# Patient Record
Sex: Female | Born: 1945
Health system: Southern US, Community
[De-identification: ages and names within clinical notes are randomized; demographics above are authoritative.]

## PROBLEM LIST (undated history)

## (undated) DIAGNOSIS — J45909 Unspecified asthma, uncomplicated: Secondary | ICD-10-CM

## (undated) DIAGNOSIS — J449 Chronic obstructive pulmonary disease, unspecified: Secondary | ICD-10-CM

## (undated) DIAGNOSIS — G4730 Sleep apnea, unspecified: Secondary | ICD-10-CM

## (undated) DIAGNOSIS — R0902 Hypoxemia: Secondary | ICD-10-CM

## (undated) DIAGNOSIS — E785 Hyperlipidemia, unspecified: Secondary | ICD-10-CM

## (undated) DIAGNOSIS — I10 Essential (primary) hypertension: Secondary | ICD-10-CM

## (undated) DIAGNOSIS — E119 Type 2 diabetes mellitus without complications: Secondary | ICD-10-CM

## (undated) DIAGNOSIS — M1990 Unspecified osteoarthritis, unspecified site: Secondary | ICD-10-CM

## (undated) DIAGNOSIS — C801 Malignant (primary) neoplasm, unspecified: Secondary | ICD-10-CM

## (undated) HISTORY — DX: Essential (primary) hypertension: I10

## (undated) HISTORY — PX: ABDOMINAL HYSTERECTOMY: SHX81

## (undated) HISTORY — DX: Chronic obstructive pulmonary disease, unspecified: J44.9

## (undated) HISTORY — DX: Type 2 diabetes mellitus without complications: E11.9

## (undated) HISTORY — DX: Unspecified asthma, uncomplicated: J45.909

## (undated) HISTORY — DX: Hypoxemia: R09.02

## (undated) HISTORY — DX: Hyperlipidemia, unspecified: E78.5

## (undated) HISTORY — DX: Malignant (primary) neoplasm, unspecified: C80.1

## (undated) HISTORY — DX: Unspecified osteoarthritis, unspecified site: M19.90

## (undated) HISTORY — DX: Sleep apnea, unspecified: G47.30

## (undated) HISTORY — PX: COLONOSCOPY: SHX174

## (undated) HISTORY — PX: TUBAL LIGATION: SHX77

## (undated) HISTORY — PX: EYE SURGERY: SHX253

---

## 1997-10-14 ENCOUNTER — Ambulatory Visit (HOSPITAL_COMMUNITY): Admission: RE | Admit: 1997-10-14 | Discharge: 1997-10-14 | Payer: Self-pay | Admitting: *Deleted

## 1998-06-03 ENCOUNTER — Other Ambulatory Visit: Admission: RE | Admit: 1998-06-03 | Discharge: 1998-06-03 | Payer: Self-pay | Admitting: *Deleted

## 1998-06-29 ENCOUNTER — Other Ambulatory Visit: Admission: RE | Admit: 1998-06-29 | Discharge: 1998-06-29 | Payer: Self-pay | Admitting: *Deleted

## 1998-08-18 ENCOUNTER — Ambulatory Visit (HOSPITAL_COMMUNITY): Admission: RE | Admit: 1998-08-18 | Discharge: 1998-08-18 | Payer: Self-pay | Admitting: Gastroenterology

## 1998-08-18 ENCOUNTER — Encounter: Payer: Self-pay | Admitting: Gastroenterology

## 1998-11-14 ENCOUNTER — Inpatient Hospital Stay (HOSPITAL_COMMUNITY): Admission: AD | Admit: 1998-11-14 | Discharge: 1998-11-18 | Payer: Self-pay | Admitting: *Deleted

## 1998-11-16 ENCOUNTER — Encounter: Payer: Self-pay | Admitting: Pulmonary Disease

## 1998-11-18 ENCOUNTER — Encounter: Payer: Self-pay | Admitting: Pulmonary Disease

## 1999-02-23 ENCOUNTER — Encounter: Admission: RE | Admit: 1999-02-23 | Discharge: 1999-03-16 | Payer: Self-pay | Admitting: *Deleted

## 2005-05-23 ENCOUNTER — Encounter: Payer: Self-pay | Admitting: *Deleted

## 2007-12-02 ENCOUNTER — Ambulatory Visit: Payer: Self-pay | Admitting: Internal Medicine

## 2007-12-02 DIAGNOSIS — D869 Sarcoidosis, unspecified: Secondary | ICD-10-CM

## 2007-12-02 DIAGNOSIS — Z8541 Personal history of malignant neoplasm of cervix uteri: Secondary | ICD-10-CM

## 2007-12-02 DIAGNOSIS — I10 Essential (primary) hypertension: Secondary | ICD-10-CM

## 2007-12-02 DIAGNOSIS — G4730 Sleep apnea, unspecified: Secondary | ICD-10-CM | POA: Insufficient documentation

## 2007-12-02 DIAGNOSIS — J45909 Unspecified asthma, uncomplicated: Secondary | ICD-10-CM | POA: Insufficient documentation

## 2009-03-06 ENCOUNTER — Encounter: Admission: RE | Admit: 2009-03-06 | Discharge: 2009-03-06 | Payer: Self-pay | Admitting: Emergency Medicine

## 2011-04-14 ENCOUNTER — Ambulatory Visit (INDEPENDENT_AMBULATORY_CARE_PROVIDER_SITE_OTHER): Payer: Medicare Other | Admitting: Family Medicine

## 2011-04-14 VITALS — BP 178/73 | HR 71 | Temp 98.3°F | Ht 67.0 in | Wt 353.0 lb

## 2011-04-14 DIAGNOSIS — J45909 Unspecified asthma, uncomplicated: Secondary | ICD-10-CM

## 2011-04-14 DIAGNOSIS — I10 Essential (primary) hypertension: Secondary | ICD-10-CM

## 2011-04-14 MED ORDER — HYDROCHLOROTHIAZIDE 25 MG PO TABS: 25.0000 mg | ORAL_TABLET | Freq: Every day | ORAL | Status: DC

## 2011-04-14 MED ORDER — MOMETASONE FURO-FORMOTEROL FUM 200-5 MCG/ACT IN AERO: 2.0000 | INHALATION_SPRAY | Freq: Two times a day (BID) | RESPIRATORY_TRACT | Status: DC

## 2011-04-14 MED ORDER — AZITHROMYCIN 250 MG PO TABS: ORAL_TABLET | ORAL | Status: AC

## 2011-04-14 NOTE — Patient Instructions (Signed)
Asthma, Adult Asthma is caused by narrowing of the air passages in the lungs. It may be triggered by pollen, dust, animal dander, molds, some foods, respiratory infections, exposure to smoke, exercise, emotional stress or other allergens (things that cause allergic reactions or allergies). Repeat attacks are common. HOME CARE INSTRUCTIONS   Use prescription medications as ordered by your caregiver.   Avoid pollen, dust, animal dander, molds, smoke and other things that cause attacks at home and at work.   You may have fewer attacks if you decrease dust in your home. Electrostatic air cleaners may help.   It may help to replace your pillows or mattress with materials less likely to cause allergies.   Talk to your caregiver about an action plan for managing asthma attacks at home, including, the use of a peak flow meter which measures the severity of your asthma attack. An action plan can help minimize or stop the attack without having to seek medical care.   If you are not on a fluid restriction, drink 8 to 10 glasses of water each day.   Always have a plan prepared for seeking medical attention, including, calling your physician, accessing local emergency care, and calling 911 (in the [place] for a severe attack.   Discuss possible exercise routines with your caregiver.   If animal dander is the cause of asthma, you may need to get rid of pets.  SEEK MEDICAL CARE IF:   You have wheezing and shortness of breath even if taking medicine to prevent attacks.   You have muscle aches, chest pain or thickening of sputum.   Your sputum changes from clear or white to yellow, green, gray, or bloody.   You have any problems that may be related to the medicine you are taking (such as a rash, itching, swelling or trouble breathing).  SEEK IMMEDIATE MEDICAL CARE IF:   Your usual medicines do not stop your wheezing or there is increased coughing and/or shortness of breath.   You have increased  difficulty breathing.   You have a fever.  MAKE SURE YOU:   Understand these instructions.   Will watch your condition.   Will get help right away if you are not doing well or get worse.  Document Released: 01/08/2005 Document Revised: 12/28/2010 Document Reviewed: 08/27/2007 [facility] Patient Information 2012 [place], [place].Asthma Prevention Cigarette smoke, house dust, molds, pollens, animal dander, certain insects, exercise, and even cold air are all triggers that can cause an asthma attack. Often, no specific triggers are identified.  Take the following measures around your house to reduce attacks:  Avoid cigarette and other smoke. No smoking should be allowed in a home where someone with asthma lives. If smoking is allowed indoors, it should be done in a room with a closed door, and a window should be opened to clear the air. If possible, do not use a wood-burning stove, kerosene heater, or fireplace. Minimize exposure to all sources of smoke, including incense, candles, fires, and fireworks.   Decrease pollen exposure. Keep your windows shut and use central air during the pollen allergy season. Stay indoors with windows closed from late morning to afternoon, if you can. Avoid mowing the lawn if you have grass pollen allergy. Change your clothes and shower after being outside during this time of year.   Remove molds from bathrooms and wet areas. Do this by cleaning the floors with a fungicide or diluted bleach. Avoid using humidifiers, vaporizers, or swamp coolers. These can spread molds through the  air. Fix leaky faucets, pipes, or other sources of water that have mold around them.   Decrease house dust exposure. Do this by using bare floors, vacuuming frequently, and changing furnace and air cooler filters frequently. Avoid using feather, wool, or foam bedding. Use polyester pillows and plastic covers over your mattress. Wash bedding weekly in hot water (hotter than 130 F).   Try to  get someone else to vacuum for you once or twice a week, if you can. Stay out of rooms while they are being vacuumed and for a short while afterward. If you vacuum, use a dust mask (from a hardware store), a double-layered or microfilter vacuum cleaner bag, or a vacuum cleaner with a HEPA filter.   Avoid perfumes, talcum powder, hair spray, paints and other strong odors and fumes.   Keep warm-blooded pets (cats, dogs, rodents, birds) outside the home if they are triggers for asthma. If you can't keep the pet outdoors, keep the pet out of your bedroom and other sleeping areas at all times, and keep the door closed. Remove carpets and furniture covered with cloth from your home. If that is not possible, keep the pet away from fabric-covered furniture and carpets.   Eliminate cockroaches. Keep food and garbage in closed containers. Never leave food out. Use poison baits, traps, powders, gels, or paste (for example, boric acid). If a spray is used to kill cockroaches, stay out of the room until the odor goes away.   Decrease indoor humidity to less than 60%. Use an indoor air cleaning device.   Avoid sulfites in foods and beverages. Do not drink beer or wine or eat dried fruit, processed potatoes, or shrimp if they cause asthma symptoms.   Avoid cold air. Cover your nose and mouth with a scarf on cold or windy days.   Avoid aspirin. This is the most common drug causing serious asthma attacks.   If exercise triggers your asthma, ask your caregiver how you should prepare before exercising. (For example, ask if you could use your inhaler 10 minutes before exercising.)   Avoid close contact with people who have a cold or the flu since your asthma symptoms may get worse if you catch the infection from them. Wash your hands thoroughly after touching items that may have been handled by others with a respiratory infection.   Get a flu shot every year to protect against the flu virus, which often makes asthma  worse for days to weeks. Also get a pneumonia shot once every five to 10 years.  Call your caregiver if you want further information about measures you can take to help prevent asthma attacks. Document Released: 01/08/2005 Document Revised: 12/28/2010 Document Reviewed: 11/16/2008 [facility] Patient Information 2012 [place], [place].

## 2011-04-14 NOTE — Progress Notes (Signed)
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  Inhaler is just not working and she ran out.  Also needs bp med after 5-6 years of hypertension.  No h/o heart disease.  No chest pain.   O:  Alert, mildly dyspneic with walking in office, friendly and not in any acute distress Chest:  Wheezes bilaterally Heart: reg, no murmur Ext: no signif edema  A: asthmatic bronchitis, hypertension  P:  dulera 200bid z pak.  Refill HCTZ 25 qd

## 2011-10-30 ENCOUNTER — Ambulatory Visit (INDEPENDENT_AMBULATORY_CARE_PROVIDER_SITE_OTHER): Payer: Medicare Other | Admitting: Family Medicine

## 2011-10-30 VITALS — BP 152/92 | HR 98 | Temp 98.7°F | Resp 28 | Ht 67.5 in | Wt 368.0 lb

## 2011-10-30 DIAGNOSIS — Z79899 Other long term (current) drug therapy: Secondary | ICD-10-CM

## 2011-10-30 DIAGNOSIS — M1990 Unspecified osteoarthritis, unspecified site: Secondary | ICD-10-CM

## 2011-10-30 DIAGNOSIS — I10 Essential (primary) hypertension: Secondary | ICD-10-CM

## 2011-10-30 DIAGNOSIS — J45901 Unspecified asthma with (acute) exacerbation: Secondary | ICD-10-CM

## 2011-10-30 DIAGNOSIS — M129 Arthropathy, unspecified: Secondary | ICD-10-CM

## 2011-10-30 LAB — POCT CBC
Granulocyte percent: 53.6 %G (ref 37–80)
HCT, POC: 45.4 % (ref 37.7–47.9)
Lymph, poc: 3.9 — AB (ref 0.6–3.4)
MCH, POC: 26.8 pg — AB (ref 27–31.2)
MCHC: 29.7 g/dL — AB (ref 31.8–35.4)
MCV: 90.1 fL (ref 80–97)
MID (cbc): 0.9 (ref 0–0.9)
POC LYMPH PERCENT: 38.1 %L (ref 10–50)
Platelet Count, POC: 298 10*3/uL (ref 142–424)
RDW, POC: 14.9 %
WBC: 10.3 10*3/uL — AB (ref 4.6–10.2)

## 2011-10-30 MED ORDER — MELOXICAM 7.5 MG PO TABS: 7.5000 mg | ORAL_TABLET | Freq: Every day | ORAL | Status: DC | PRN

## 2011-10-30 MED ORDER — ALBUTEROL SULFATE (2.5 MG/3ML) 0.083% IN NEBU: 2.5000 mg | INHALATION_SOLUTION | RESPIRATORY_TRACT | Status: DC | PRN

## 2011-10-30 MED ORDER — PREDNISONE 20 MG PO TABS: 40.0000 mg | ORAL_TABLET | Freq: Every day | ORAL | Status: DC

## 2011-10-30 MED ORDER — HYDROCODONE-HOMATROPINE 5-1.5 MG/5ML PO SYRP: 5.0000 mL | ORAL_SOLUTION | Freq: Three times a day (TID) | ORAL | Status: DC | PRN

## 2011-10-30 MED ORDER — ALBUTEROL SULFATE (2.5 MG/3ML) 0.083% IN NEBU
2.5000 mg | INHALATION_SOLUTION | Freq: Once | RESPIRATORY_TRACT | Status: AC
  Administered 2011-10-30: 2.5 mg via RESPIRATORY_TRACT

## 2011-10-30 NOTE — Progress Notes (Signed)
  Subjective:    Patient ID: Christine Parrish, female    DOB: 1945/02/06, 66 y.o.   MRN: [id number]  HPI  Had a cold and allergies which has now resolved but still coughing a lot - really bothering her.  Whenever she gets ill she knows she can get through the cold but can't rid of the cough as her asthma gets exac.  Did have sarcoid but has been latent and no prob for years.   F/c and cold sxs all resolved. Cough productive. No SHoB or wheezing.  Does have a nebulizer machine but needs new tubing to use it.    Review of Systems  Constitutional: Positive for fatigue. Negative for fever, chills, diaphoresis and unexpected weight change.  HENT: Negative for ear pain, congestion, sore throat, rhinorrhea and sinus pressure.   Eyes: Negative for discharge and itching.  Respiratory: Positive for cough. Negative for chest tightness and shortness of breath.   Cardiovascular: Negative for chest pain.  Gastrointestinal: Negative for nausea and vomiting.  Genitourinary: Positive for frequency. Negative for decreased urine volume and difficulty urinating.  Musculoskeletal: Positive for arthralgias. Negative for gait problem.  Skin: Negative for color change and rash.  Hematological: Positive for adenopathy.      No past medical history on file. BP 152/92  Pulse 98  Temp 98.7 F (37.1 C) (Oral)  Resp 28  Ht 5' 7.5" (1.715 m)  Wt 368 lb (166.924 kg)  BMI 56.79 kg/m2  SpO2 89%  Objective:   Physical Exam  Constitutional: She is oriented to person, place, and time. She appears well-developed and well-nourished. No distress.  HENT:  Head: Normocephalic and atraumatic.  Right Ear: Tympanic membrane, external ear and ear canal normal.  Left Ear: Tympanic membrane, external ear and ear canal normal.  Nose: Mucosal edema present. No rhinorrhea.  Mouth/Throat: Oropharynx is clear and moist. No oropharyngeal exudate.  Eyes: Conjunctivae normal are normal. Right eye exhibits no discharge. Left eye  exhibits no discharge. No scleral icterus.  Neck: Normal range of motion. Neck supple. Thyromegaly present.  Cardiovascular: Normal rate, regular rhythm and normal heart sounds.   Pulmonary/Chest: Effort normal and breath sounds normal. No accessory muscle usage. No respiratory distress. She has no decreased breath sounds. She has no wheezes. She has no rales.       Mild inspiratory wheeze and prolonged inspiration with repeated ventilation.  Lymphadenopathy:    She has no cervical adenopathy.  Neurological: She is alert and oriented to person, place, and time.  Skin: Skin is warm and dry. She is not diaphoretic.  Psychiatric: She has a normal mood and affect. Her behavior is normal.          Assessment & Plan:  1. Asthma exac - prednisone burst, alb nebs, prn hycodan. Gave neg in office, brought O2 sats up to 96%. 2. Arthritis - refilled mobic - check renal fxn 3. HTN - cont hctz - check electrolytes, likely elev due to acute illness. Recheck in 1 mo.

## 2011-10-31 ENCOUNTER — Telehealth: Payer: Self-pay

## 2011-10-31 LAB — BASIC METABOLIC PANEL
BUN: 19 mg/dL (ref 6–23)
Calcium: 9.7 mg/dL (ref 8.4–10.5)
Creat: 1.15 mg/dL — ABNORMAL HIGH (ref 0.50–1.10)
Glucose, Bld: 110 mg/dL — ABNORMAL HIGH (ref 70–99)
Potassium: 3.3 mEq/L — ABNORMAL LOW (ref 3.5–5.3)

## 2011-10-31 NOTE — Progress Notes (Signed)
Reviewed and agree.

## 2011-10-31 NOTE — Telephone Encounter (Signed)
The patient was seen by Dr. Clelia Croft on 10/30/11.  The patient is requesting cough syrup for her cough.  The patient states that her constant coughing is causing her to have bladder leakage and she really needs cough medicine at this time.  Please call the patient at [phone].

## 2011-10-31 NOTE — Telephone Encounter (Signed)
Looks like she was rx'ed Hycodan. Is she taking this? She may also need to be using an inhaler, does she have an albuterol inhaler at home?

## 2011-11-01 NOTE — Telephone Encounter (Signed)
Pt reports that her pharm did not get the Rx for Hycodan - she keeps calling and even this morning was told they don't have Rx. I advised pt that I will call [facility] for her and give it to them over the phone if they do not have it. Pt thanked [place].

## 2011-11-01 NOTE — Telephone Encounter (Signed)
Cough syrup faxed to [facility], I have called patient, to advise but VM not set up.

## 2012-01-12 ENCOUNTER — Ambulatory Visit (INDEPENDENT_AMBULATORY_CARE_PROVIDER_SITE_OTHER): Payer: Medicare Other | Admitting: Emergency Medicine

## 2012-01-12 ENCOUNTER — Ambulatory Visit: Payer: Medicare Other

## 2012-01-12 VITALS — BP 158/98 | HR 94 | Temp 98.2°F | Resp 18 | Ht 66.5 in | Wt 373.0 lb

## 2012-01-12 DIAGNOSIS — J45909 Unspecified asthma, uncomplicated: Secondary | ICD-10-CM

## 2012-01-12 DIAGNOSIS — M25519 Pain in unspecified shoulder: Secondary | ICD-10-CM

## 2012-01-12 DIAGNOSIS — I10 Essential (primary) hypertension: Secondary | ICD-10-CM

## 2012-01-12 DIAGNOSIS — R0602 Shortness of breath: Secondary | ICD-10-CM

## 2012-01-12 DIAGNOSIS — M25512 Pain in left shoulder: Secondary | ICD-10-CM

## 2012-01-12 DIAGNOSIS — M542 Cervicalgia: Secondary | ICD-10-CM

## 2012-01-12 MED ORDER — GABAPENTIN 300 MG PO CAPS: ORAL_CAPSULE | ORAL | Status: DC

## 2012-01-12 MED ORDER — HYDROCODONE-ACETAMINOPHEN 5-325 MG PO TABS: 1.0000 | ORAL_TABLET | Freq: Three times a day (TID) | ORAL | Status: DC | PRN

## 2012-01-12 MED ORDER — ALBUTEROL SULFATE (2.5 MG/3ML) 0.083% IN NEBU: 2.5000 mg | INHALATION_SOLUTION | Freq: Once | RESPIRATORY_TRACT | Status: DC

## 2012-01-12 NOTE — Patient Instructions (Addendum)
Cervical Radiculopathy  Cervical radiculopathy happens when a nerve in the neck is pinched or bruised by a slipped (herniated) disk or by arthritic changes in the bones of the cervical spine. This can occur due to an injury or as part of the normal aging process. Pressure on the cervical nerves can cause pain or numbness that runs from your neck all the way down into your arm and fingers.  CAUSES   There are many possible causes, including:   Injury.   Muscle tightness in the neck from overuse.   Swollen, painful joints (arthritis).   Breakdown or degeneration in the bones and joints of the spine (spondylosis) due to aging.   Bone spurs that may develop near the cervical nerves.  SYMPTOMS   Symptoms include pain, weakness, or numbness in the affected arm and hand. Pain can be severe or irritating. Symptoms may be worse when extending or turning the neck.  DIAGNOSIS   Your caregiver will ask about your symptoms and do a physical exam. He or she may test your strength and reflexes. X-rays, CT scans, and MRI scans may be needed in cases of injury or if the symptoms do not go away after a period of time. Electromyography (EMG) or nerve conduction testing may be done to study how your nerves and muscles are working.  TREATMENT   Your caregiver may recommend certain exercises to help relieve your symptoms. Cervical radiculopathy can, and often does, get better with time and treatment. If your problems continue, treatment options may include:   Wearing a soft collar for short periods of time.   Physical therapy to strengthen the neck muscles.   Medicines, such as nonsteroidal anti-inflammatory drugs (NSAIDs), oral corticosteroids, or spinal injections.   Surgery. Different types of surgery may be done depending on the cause of your problems.  HOME CARE INSTRUCTIONS    Put ice on the affected area.   Put ice in a plastic bag.   Place a towel between your skin and the bag.   Leave the ice on for 15 to 20  minutes, 3 to 4 times a day or as directed by your caregiver.   If ice does not help, you can try using heat. Take a warm shower or bath, or use a hot water bottle as directed by your caregiver.   You may try a gentle neck and shoulder massage.   Use a flat pillow when you sleep.   Only take over-the-counter or prescription medicines for pain, discomfort, or fever as directed by your caregiver.   If physical therapy was prescribed, follow your caregiver's directions.   If a soft collar was prescribed, use it as directed.  SEEK IMMEDIATE MEDICAL CARE IF:    Your pain gets much worse and cannot be controlled with medicines.   You have weakness or numbness in your hand, arm, face, or leg.   You have a high fever or a stiff, rigid neck.   You lose bowel or bladder control (incontinence).   You have trouble with walking, balance, or speaking.  MAKE SURE YOU:    Understand these instructions.   Will watch your condition.   Will get help right away if you are not doing well or get worse.  Document Released: 10/03/2000 Document Revised: 04/02/2011 Document Reviewed: 08/22/2010  [employer] Patient Information 2013 [employer].

## 2012-01-12 NOTE — Progress Notes (Signed)
  Subjective:    Patient ID: Christine Parrish, female    DOB: 09-08-1945, 66 y.o.   MRN: [id number]  HPI   Presents today with left shoulder pain. Some neck pain. She took Nyquil to get some sleep, states this knocks her out. She has some numbness with her fingers of left hand. Has seen orthopedics for her left shoulder and had injection, unsure when this was done, patient states recently.    Review of Systems     Objective:   Physical Exam Obese female appears short of breath during exam. States she did not take her breathing treatment today. Significant tenderness over the left side of her neck. There is decreased range of motion of her neck. There is no focal weakness of the upper extremities. Chest exam reveals bilateral expiratory wheezes. Cardiac exam is unremarkable. Repeat blood pressure was 158/98  MRI done in 2011 showed multi-level disease worse at C6-C7.   [facility] reading (PRIMARY) by  Dr. Cleta Alberts there are degenerative changes of the shoulder. There are also some radiolucent areas in the scapula and glenoid area pleasecomment  Assessment & Plan:  Ahead and get a breathing treatment with albuterol. I would not want her on prednisone because of her elevated blood pressure and poor control here. We'll need to add a medication to help with her blood pressure control given on pain medications and see if and get her comfortable. We'll go ahead and try some hydrocodone for pain I started her on Neurontin. The radiologist is giving an over read on the x-rays that were done. She is agreeable to come in in January and have a full physical at that time.

## 2012-04-08 ENCOUNTER — Other Ambulatory Visit: Payer: Self-pay | Admitting: Family Medicine

## 2012-04-08 NOTE — Telephone Encounter (Signed)
Pt needs OV and labs, was supposed to recheck Nov 2013 after abnormal BMP

## 2012-05-24 ENCOUNTER — Ambulatory Visit (INDEPENDENT_AMBULATORY_CARE_PROVIDER_SITE_OTHER): Payer: Medicare Other | Admitting: Family Medicine

## 2012-05-24 VITALS — BP 150/96 | HR 90 | Temp 99.0°F | Resp 16 | Ht 66.0 in | Wt 365.8 lb

## 2012-05-24 DIAGNOSIS — M47812 Spondylosis without myelopathy or radiculopathy, cervical region: Secondary | ICD-10-CM

## 2012-05-24 DIAGNOSIS — I10 Essential (primary) hypertension: Secondary | ICD-10-CM

## 2012-05-24 DIAGNOSIS — M5412 Radiculopathy, cervical region: Secondary | ICD-10-CM | POA: Insufficient documentation

## 2012-05-24 LAB — COMPREHENSIVE METABOLIC PANEL
AST: 13 U/L (ref 0–37)
Alkaline Phosphatase: 67 U/L (ref 39–117)
BUN: 14 mg/dL (ref 6–23)
Creat: 1.2 mg/dL — ABNORMAL HIGH (ref 0.50–1.10)

## 2012-05-24 LAB — LIPID PANEL
Cholesterol: 182 mg/dL (ref 0–200)
HDL: 51 mg/dL (ref >39)
Total CHOL/HDL Ratio: 3.6 Ratio
Triglycerides: 82 mg/dL (ref <150)

## 2012-05-24 MED ORDER — HYDROCODONE-ACETAMINOPHEN 5-325 MG PO TABS: 1.0000 | ORAL_TABLET | Freq: Three times a day (TID) | ORAL | Status: DC | PRN

## 2012-05-24 MED ORDER — HYDROCHLOROTHIAZIDE 25 MG PO TABS: 25.0000 mg | ORAL_TABLET | Freq: Every day | ORAL | Status: DC

## 2012-05-24 MED ORDER — MELOXICAM 7.5 MG PO TABS: 7.5000 mg | ORAL_TABLET | Freq: Every day | ORAL | Status: DC | PRN

## 2012-05-24 MED ORDER — GABAPENTIN 300 MG PO CAPS: ORAL_CAPSULE | ORAL | Status: DC

## 2012-05-24 NOTE — Patient Instructions (Addendum)
Usual medications as directed.   Be very careful with the hydrocodone pain pills.  With a blood pressure being high I would like you to return in 3 or 4 months for a recheck.  I encourage you to get out more and try to get a little exercise.  Eat less to help you lose weight.  Call the breast Center to arrange for a mammogram

## 2012-05-24 NOTE — Progress Notes (Signed)
Subjective: 67 year old lady who needs medicine refills. She takes medicines for her neck. She has radiculopathy down into her hands. She takes gabapentin, Mobic, and pain medications for that. It helps some. She still has gotten weak and in her grips. 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 Her blood pressure apparently is usually controlled, but she has been out of her medications. She has no other major acute complaints. She lives a in her apartment but a daughter and a son stay there some also. Her some retrocardiac, so she does not have running a transfixion. She regularly does stays at home and does not go out.  Objective: Pleasant alert oriented obese Afro-American female in no major distress. Neck has full range of motion but does have some crepitance. Her grip is weak in both arms. Blood pressure is still little high even on repeat, but she did not have her medication. Her chest is clear. Heart regular without murmurs.  Assessment: Cervical pain with radiculopathy Hypertension History of sarcoidosis History of asthma  Plan: Refill her medications. Give her one month or so worth of hydrocodone pain medication, just use on a when necessary basis.

## 2012-05-24 NOTE — Addendum Note (Signed)
Addended by: HOPPER, DAVID H on: 05/24/2012 12:54 PM   Modules accepted: Orders

## 2012-05-26 ENCOUNTER — Encounter: Payer: Self-pay | Admitting: Family Medicine

## 2012-05-30 ENCOUNTER — Other Ambulatory Visit: Payer: Self-pay

## 2012-05-30 DIAGNOSIS — Z1231 Encounter for screening mammogram for malignant neoplasm of breast: Secondary | ICD-10-CM

## 2012-07-08 ENCOUNTER — Ambulatory Visit
Admission: RE | Admit: 2012-07-08 | Discharge: 2012-07-08 | Disposition: A | Discharge status: Home / Self Care | Payer: Medicare Other | Source: Ambulatory Visit

## 2012-07-08 DIAGNOSIS — Z1231 Encounter for screening mammogram for malignant neoplasm of breast: Secondary | ICD-10-CM

## 2013-06-10 ENCOUNTER — Ambulatory Visit (HOSPITAL_COMMUNITY): Admission: RE | Admit: 2013-06-10 | Payer: Medicare Other | Source: Ambulatory Visit

## 2013-06-10 ENCOUNTER — Ambulatory Visit (HOSPITAL_COMMUNITY): Payer: Medicare Other

## 2013-06-10 ENCOUNTER — Ambulatory Visit: Payer: Medicare Other

## 2013-06-10 ENCOUNTER — Ambulatory Visit (INDEPENDENT_AMBULATORY_CARE_PROVIDER_SITE_OTHER): Payer: Medicare Other | Admitting: Family Medicine

## 2013-06-10 VITALS — BP 162/100 | HR 112 | Temp 98.4°F | Resp 17 | Ht 65.0 in | Wt 364.0 lb

## 2013-06-10 DIAGNOSIS — IMO0002 Reserved for concepts with insufficient information to code with codable children: Secondary | ICD-10-CM

## 2013-06-10 DIAGNOSIS — R05 Cough: Secondary | ICD-10-CM

## 2013-06-10 DIAGNOSIS — R011 Cardiac murmur, unspecified: Secondary | ICD-10-CM

## 2013-06-10 DIAGNOSIS — I10 Essential (primary) hypertension: Secondary | ICD-10-CM

## 2013-06-10 DIAGNOSIS — R059 Cough, unspecified: Secondary | ICD-10-CM

## 2013-06-10 DIAGNOSIS — R0602 Shortness of breath: Secondary | ICD-10-CM

## 2013-06-10 DIAGNOSIS — E1165 Type 2 diabetes mellitus with hyperglycemia: Secondary | ICD-10-CM

## 2013-06-10 DIAGNOSIS — E119 Type 2 diabetes mellitus without complications: Secondary | ICD-10-CM | POA: Insufficient documentation

## 2013-06-10 DIAGNOSIS — E118 Type 2 diabetes mellitus with unspecified complications: Secondary | ICD-10-CM

## 2013-06-10 DIAGNOSIS — R000 Tachycardia, unspecified: Secondary | ICD-10-CM

## 2013-06-10 LAB — POCT CBC
Granulocyte percent: 67.7 %G (ref 37–80)
HEMATOCRIT: 48.6 % — AB (ref 37.7–47.9)
HEMOGLOBIN: 15.1 g/dL (ref 12.2–16.2)
LYMPH, POC: 2.9 (ref 0.6–3.4)
MCH: 28 pg (ref 27–31.2)
MCHC: 31.1 g/dL — AB (ref 31.8–35.4)
MCV: 90.1 fL (ref 80–97)
MID (cbc): 0.7 (ref 0–0.9)
MPV: 11.6 fL (ref 0–99.8)
POC Granulocyte: 7.5 — AB (ref 2–6.9)
POC LYMPH %: 26.2 % (ref 10–50)
POC MID %: 6.1 %M (ref 0–12)
Platelet Count, POC: 314 10*3/uL (ref 142–424)
RBC: 5.39 M/uL (ref 4.04–5.48)
RDW, POC: 14.9 %
WBC: 11.1 10*3/uL — AB (ref 4.6–10.2)

## 2013-06-10 LAB — GLUCOSE, POCT (MANUAL RESULT ENTRY): POC GLUCOSE: 247 mg/dL — AB (ref 70–99)

## 2013-06-10 LAB — D-DIMER, QUANTITATIVE: D-Dimer, Quant: 0.59 ug/mL-FEU — ABNORMAL HIGH (ref 0.00–0.48)

## 2013-06-10 LAB — POCT GLYCOSYLATED HEMOGLOBIN (HGB A1C): Hemoglobin A1C: 8.4

## 2013-06-10 MED ORDER — GUAIFENESIN-CODEINE 100-10 MG/5ML PO SYRP: 5.0000 mL | ORAL_SOLUTION | Freq: Three times a day (TID) | ORAL | Status: DC | PRN

## 2013-06-10 MED ORDER — HYDROCHLOROTHIAZIDE 25 MG PO TABS: 25.0000 mg | ORAL_TABLET | Freq: Every day | ORAL | Status: DC

## 2013-06-10 MED ORDER — DOXYCYCLINE HYCLATE 100 MG PO CAPS: 100.0000 mg | ORAL_CAPSULE | Freq: Two times a day (BID) | ORAL | Status: DC

## 2013-06-10 MED ORDER — LISINOPRIL 10 MG PO TABS: 10.0000 mg | ORAL_TABLET | Freq: Every day | ORAL | Status: DC

## 2013-06-10 MED ORDER — METFORMIN HCL 500 MG PO TABS: 500.0000 mg | ORAL_TABLET | Freq: Two times a day (BID) | ORAL | Status: DC

## 2013-06-10 NOTE — Progress Notes (Addendum)
[facility] [address] [place] [address] [phone]  Date:  06/10/2013   Name:  Christine Parrish   DOB:  03-20-1945   MRN:  [id number]  PCP:  Reginia Forts, MD    Chief Complaint: Cough and Medication Refill   History of Present Illness:  Christine Parrish is a 67 y.o. very pleasant female patient who presents with the following:  History of HTN, sleep apnea, sarcoidosis.  Last seen here about a year ago.   She came down with "a cold" on Friday- today is Wednesday.  She is now feeling better, but she continues to cough some.  She used OTC medication for her other sx.  She did have a fever and chills.  She did cough up some "fluid" with "green and yellow specks."    She also needs a RF of her BP medication. She normally takes her HCTZ in the morning; she did not take it over the last couple of days.  She has a blood pressure machine but has not used it lately.  She needs to change the battery in the machine so it will work.   She notes that she gets very SOB with walking more than a few feet; this has been the case for a couple of years.  She quit smoking about 10 years ago. She smoked since "I was a little bitty kid, about 11 years old."   She notes osscaional chest pain; "I think it be indigestion but I don't know."  It does not seem to be associated with activity, but reports the last time she really exercised was about 2 years ago when she had to visit her nephew in the hospital. "I nearly passed out."   She has never seen cardiology, never had a stress test. She does not have any active CP now  Patient Active Problem List   Diagnosis Date Noted  . Cervical neuropathic pain 05/24/2012  . SARCOIDOSIS 12/02/2007  . HYPERTENSION 12/02/2007  . ASTHMA 12/02/2007  . SLEEP APNEA 12/02/2007  . CERVICAL CANCER, HX OF 12/02/2007    Past Medical History  Diagnosis Date  . Arthritis   . Asthma   . Hypertension   . Cancer     Past Surgical History  Procedure  Laterality Date  . Eye surgery    . Tubal ligation    . Abdominal hysterectomy      History  Substance Use Topics  . Smoking status: Former Smoker    Types: Cigarettes    Quit date: 06/23/2004  . Smokeless tobacco: Not on file  . Alcohol Use: Yes    History reviewed. No pertinent family history.  No Known Allergies  Medication list has been reviewed and updated.  Current Outpatient Prescriptions on File Prior to Visit  Medication Sig Dispense Refill  . gabapentin (NEURONTIN) 300 MG capsule Take 1 tablet at night for the first week and if tolerated increase to 1 tablet in the morning one tablet at bedtime .  90 capsule  3  . hydrochlorothiazide (HYDRODIURIL) 25 MG tablet Take 1 tablet (25 mg total) by mouth daily. NEED VISIT/LABS!!  90 tablet  3  . HYDROcodone-acetaminophen (NORCO) 5-325 MG per tablet Take 1 tablet by mouth every 8 (eight) hours as needed for pain.  30 tablet  0  . HYDROcodone-homatropine (HYCODAN) 5-1.5 MG/5ML syrup Take 5 mLs by mouth every 8 (eight) hours as needed for cough.  120 mL  0  . meloxicam (MOBIC) 7.5  MG tablet Take 1 tablet (7.5 mg total) by mouth daily as needed for pain.  90 tablet  3  . Mometasone Furo-Formoterol Fum 200-5 MCG/ACT AERO Inhale 2 puffs into the lungs 2 (two) times daily.  1 Inhaler  3   No current facility-administered medications on file prior to visit.    Review of Systems:  As per HPI- otherwise negative.   Physical Examination: Filed Vitals:   06/10/13 1013  BP: 162/100  Pulse: 120  Temp: 98.4 F (36.9 C)  Resp: 17   Filed Vitals:   06/10/13 1013  Height: 5\' 5"  (1.651 m)  Weight: 364 lb (165.109 kg)   Body mass index is 60.57 kg/(m^2). Ideal Body Weight: Weight in (lb) to have BMI = 25: 149.9  GEN: WDWN, NAD, Non-toxic, A & O x 3, very severe obesity HEENT: Atraumatic, Normocephalic. Neck supple. No masses, No LAD.  Bilateral TM wnl, oropharynx normal.  PEERL,EOMI.   Ears and Nose: No external deformity. CV:  RRR, No M/G/R. No JVD. No thrill. No extra heart sounds. PULM: CTA B, no wheezes, crackles, rhonchi. No retractions. No resp. distress. No accessory muscle use. ABD: S, NT, ND, +BS. No rebound. No HSM. EXTR: No c/c/e NEURO Normal gait.  PSYCH: Normally interactive. Conversant. Not depressed or anxious appearing.  Calm demeanor.   [facility] reading (PRIMARY) by  Dr. Lorelei Pont. CXR:no definite effusion or infiltrate.  Note BMI of 60.  Appears similar to past CXR.    CHEST 2 VIEW  COMPARISON: None.  FINDINGS: Mediastinum and hilar structures are normal. Cardiomegaly with normal pulmonary vascularity. Minimal infiltrate left lung base cannot be excluded. No pleural effusion or pneumothorax. No acute osseus abnormality.  IMPRESSION: 1. Minimal infiltrate left lung base cannot be excluded . 2. Mild cardiomegaly, no CHF.  EKG: borderline sinus tachycardia.  No acute ST elevation or depression noted  Results for orders placed in visit on 06/10/13  D-DIMER, QUANTITATIVE      Result Value Ref Range   D-Dimer, Quant 0.59 (*) 0.00 - 0.48 ug/mL-FEU  POCT CBC      Result Value Ref Range   WBC 11.1 (*) 4.6 - 10.2 K/uL   Lymph, poc 2.9  0.6 - 3.4   POC LYMPH PERCENT 26.2  10 - 50 %L   MID (cbc) 0.7  0 - 0.9   POC MID % 6.1  0 - 12 %M   POC Granulocyte 7.5 (*) 2 - 6.9   Granulocyte percent 67.7  37 - 80 %G   RBC 5.39  4.04 - 5.48 M/uL   Hemoglobin 15.1  12.2 - 16.2 g/dL   HCT, POC 48.6 (*) 37.7 - 47.9 %   MCV 90.1  80 - 97 fL   MCH, POC 28.0  27 - 31.2 pg   MCHC 31.1 (*) 31.8 - 35.4 g/dL   RDW, POC 14.9     Platelet Count, POC 314  142 - 424 K/uL   MPV 11.6  0 - 99.8 fL  GLUCOSE, POCT (MANUAL RESULT ENTRY)      Result Value Ref Range   POC Glucose 247 (*) 70 - 99 mg/dl  POCT GLYCOSYLATED HEMOGLOBIN (HGB A1C)      Result Value Ref Range   Hemoglobin A1C 8.4      Assessment and Plan: SOB (shortness of breath) - Plan: DG Chest 2 View, Ambulatory referral to Cardiology,  guaiFENesin-codeine (ROBITUSSIN AC) 100-10 MG/5ML syrup, D-dimer, quantitative  Cough - Plan: DG Chest 2 View, doxycycline (VIBRAMYCIN)  100 MG capsule, guaiFENesin-codeine (ROBITUSSIN AC) 100-10 MG/5ML syrup  Severe obesity - Plan: POCT glucose (manual entry), POCT glycosylated hemoglobin (Hb A1C), Lipid panel, TSH, Ambulatory referral to Cardiology  HTN (hypertension) - Plan: POCT CBC, Comprehensive metabolic panel, Ambulatory referral to Cardiology, hydrochlorothiazide (HYDRODIURIL) 25 MG tablet, lisinopril (PRINIVIL,ZESTRIL) 10 MG tablet  Tachycardia - Plan: EKG 12-Lead, D-dimer, quantitative  Type II or unspecified type diabetes mellitus with unspecified complication, uncontrolled - Plan: Ambulatory referral to Cardiology, metFORMIN (GLUCOPHAGE) 500 MG tablet, lisinopril (PRINIVIL,ZESTRIL) 10 MG tablet  Here today with multiple issues.   SOB: check a D dimer due to tachycardia, poor exercise tolerance and SOB.  If positive will pursue CT angiogram.  She agrees with plan Cheratussin AC to use prn cough Start doxycycline for possible lung infection.  Extremely low exercise tolerance, possible CP with exercise (none now).  EKG is reassuring except for tachycardia.  Will refer to cardiology for further evaluation and possible chemical stress HTN: uncontrolled.  Add lisinopril to her regimen, especially in light of her newly dx diabetes DM: diagnosed today.  Start metformin once a day, and increase to BID after a week or so.  Given rx for meter, strips and lancets.    Signed Lamar Blinks, MD  Received positive D dimer at approx 1pm and called pt on her cell as she asked- let her know that we will set up a CT angiogram and call her with these details.  She agreed- however after CT scheduled we were unable to reach her with further details.  We have left several messages regarding her CT angio appt at 4pm, but she has not answered and did not go to her CT that I can see.    Tried again  several times during the day at work, at 6:30 pm and at 8:45 pm.  I have called both of her numbers and both of her emergency contact numbers.

## 2013-06-10 NOTE — Patient Instructions (Signed)
Please take the doxycycline for a possible lung infection. Take this twice a day for 10 days I will be in touch with your "d dimer" test- this will let us know if you could have a blood clot in your lung.  If you are positive we will set up a CT scan.    You do have diabetes.  This may have been going on for a little while.  Please start taking the metformin medication once a day in the evening.  After a week go to twice a day.  We are going to add lisinopril to your blood pressure regimen.  You will take this once a day- continue to take the hydrochlorothiazide as well.    We will get you set up to see cardiology soon-  If you do develop any chest pain please seek help right away.  Start taking a baby aspirin (81mg ) once a day.    At your convenience purchase a glucose meter, test strips and lancets (for pricking your finger).   I will be in touch with the rest of your labs

## 2013-06-11 ENCOUNTER — Ambulatory Visit (HOSPITAL_COMMUNITY)
Admission: RE | Admit: 2013-06-11 | Discharge: 2013-06-11 | Disposition: A | Discharge status: Home / Self Care | Payer: Medicare Other | Source: Ambulatory Visit | Attending: Family Medicine | Admitting: Family Medicine

## 2013-06-11 ENCOUNTER — Telehealth: Payer: Self-pay | Admitting: *Deleted

## 2013-06-11 ENCOUNTER — Encounter: Payer: Self-pay | Admitting: Family Medicine

## 2013-06-11 DIAGNOSIS — R7989 Other specified abnormal findings of blood chemistry: Secondary | ICD-10-CM

## 2013-06-11 DIAGNOSIS — J9819 Other pulmonary collapse: Secondary | ICD-10-CM | POA: Insufficient documentation

## 2013-06-11 DIAGNOSIS — M47814 Spondylosis without myelopathy or radiculopathy, thoracic region: Secondary | ICD-10-CM | POA: Insufficient documentation

## 2013-06-11 DIAGNOSIS — R0602 Shortness of breath: Secondary | ICD-10-CM

## 2013-06-11 DIAGNOSIS — K449 Diaphragmatic hernia without obstruction or gangrene: Secondary | ICD-10-CM | POA: Insufficient documentation

## 2013-06-11 LAB — CREATININE, SERUM
Creatinine, Ser: 1.3 mg/dL — ABNORMAL HIGH (ref 0.50–1.10)
GFR calc non Af Amer: 41 mL/min — ABNORMAL LOW (ref >90)
GFR, EST AFRICAN AMERICAN: 48 mL/min — AB (ref >90)

## 2013-06-11 LAB — LIPID PANEL
Cholesterol: 184 mg/dL (ref 0–200)
HDL: 50 mg/dL (ref >39)
LDL CALC: 105 mg/dL — AB (ref 0–99)
TRIGLYCERIDES: 143 mg/dL (ref <150)
Total CHOL/HDL Ratio: 3.7 Ratio
VLDL: 29 mg/dL (ref 0–40)

## 2013-06-11 LAB — TSH: TSH: 1.247 u[IU]/mL (ref 0.350–4.500)

## 2013-06-11 LAB — BUN: BUN: 15 mg/dL (ref 6–23)

## 2013-06-11 MED ORDER — IOHEXOL 350 MG/ML SOLN
100.0000 mL | Freq: Once | INTRAVENOUS | Status: AC | PRN
  Administered 2013-06-11: 75 mL via INTRAVENOUS

## 2013-06-11 NOTE — Telephone Encounter (Signed)
Pt states she fell asleep yesterday immediately following her appt with Dr. Lorelei Pont. She missed all of the phone calls and the CT angio.  Had to reorder CT Angio- Pt needs to be at [facility] at 1200 today for stat blood work and her CT   Pt has been advised and understands to be at the hospital by noon.

## 2013-06-14 ENCOUNTER — Telehealth: Payer: Self-pay | Admitting: Family Medicine

## 2013-06-14 NOTE — Telephone Encounter (Signed)
The patient's daughter, Pauletta Browns called to report that the pharmacy needs more information to be able to process the insurance for the patient's Rx for glucometer and test strips.  Per patient's daughter, the pharmacy needs drug code, exact directions and the specific brand of glucometer and test strips.  Please call the patient or patient's daughter at [phone].

## 2013-06-15 ENCOUNTER — Emergency Department (HOSPITAL_COMMUNITY)
Admission: EM | Admit: 2013-06-15 | Discharge: 2013-06-16 | Disposition: A | Discharge status: Home / Self Care | Payer: Medicare Other | Attending: Emergency Medicine | Admitting: Emergency Medicine

## 2013-06-15 ENCOUNTER — Emergency Department (HOSPITAL_COMMUNITY): Payer: Medicare Other

## 2013-06-15 ENCOUNTER — Encounter (HOSPITAL_COMMUNITY): Payer: Self-pay | Admitting: Emergency Medicine

## 2013-06-15 DIAGNOSIS — Z7982 Long term (current) use of aspirin: Secondary | ICD-10-CM | POA: Insufficient documentation

## 2013-06-15 DIAGNOSIS — E860 Dehydration: Secondary | ICD-10-CM | POA: Insufficient documentation

## 2013-06-15 DIAGNOSIS — J45909 Unspecified asthma, uncomplicated: Secondary | ICD-10-CM | POA: Insufficient documentation

## 2013-06-15 DIAGNOSIS — IMO0002 Reserved for concepts with insufficient information to code with codable children: Secondary | ICD-10-CM | POA: Insufficient documentation

## 2013-06-15 DIAGNOSIS — R109 Unspecified abdominal pain: Secondary | ICD-10-CM

## 2013-06-15 DIAGNOSIS — M129 Arthropathy, unspecified: Secondary | ICD-10-CM | POA: Insufficient documentation

## 2013-06-15 DIAGNOSIS — E669 Obesity, unspecified: Secondary | ICD-10-CM | POA: Insufficient documentation

## 2013-06-15 DIAGNOSIS — R059 Cough, unspecified: Secondary | ICD-10-CM | POA: Insufficient documentation

## 2013-06-15 DIAGNOSIS — T383X5A Adverse effect of insulin and oral hypoglycemic [antidiabetic] drugs, initial encounter: Secondary | ICD-10-CM | POA: Insufficient documentation

## 2013-06-15 DIAGNOSIS — Z792 Long term (current) use of antibiotics: Secondary | ICD-10-CM | POA: Insufficient documentation

## 2013-06-15 DIAGNOSIS — T887XXA Unspecified adverse effect of drug or medicament, initial encounter: Secondary | ICD-10-CM

## 2013-06-15 DIAGNOSIS — Z90710 Acquired absence of both cervix and uterus: Secondary | ICD-10-CM | POA: Insufficient documentation

## 2013-06-15 DIAGNOSIS — Z9851 Tubal ligation status: Secondary | ICD-10-CM | POA: Insufficient documentation

## 2013-06-15 DIAGNOSIS — R630 Anorexia: Secondary | ICD-10-CM | POA: Insufficient documentation

## 2013-06-15 DIAGNOSIS — Z79899 Other long term (current) drug therapy: Secondary | ICD-10-CM | POA: Insufficient documentation

## 2013-06-15 DIAGNOSIS — R112 Nausea with vomiting, unspecified: Secondary | ICD-10-CM | POA: Insufficient documentation

## 2013-06-15 DIAGNOSIS — R1033 Periumbilical pain: Secondary | ICD-10-CM | POA: Insufficient documentation

## 2013-06-15 DIAGNOSIS — R197 Diarrhea, unspecified: Secondary | ICD-10-CM | POA: Insufficient documentation

## 2013-06-15 DIAGNOSIS — R05 Cough: Secondary | ICD-10-CM | POA: Insufficient documentation

## 2013-06-15 DIAGNOSIS — E119 Type 2 diabetes mellitus without complications: Secondary | ICD-10-CM | POA: Insufficient documentation

## 2013-06-15 DIAGNOSIS — Z87891 Personal history of nicotine dependence: Secondary | ICD-10-CM | POA: Insufficient documentation

## 2013-06-15 DIAGNOSIS — I10 Essential (primary) hypertension: Secondary | ICD-10-CM | POA: Insufficient documentation

## 2013-06-15 LAB — COMPREHENSIVE METABOLIC PANEL
ALT: 10 U/L (ref 0–35)
AST: 16 U/L (ref 0–37)
Albumin: 3.3 g/dL — ABNORMAL LOW (ref 3.5–5.2)
Alkaline Phosphatase: 97 U/L (ref 39–117)
BILIRUBIN TOTAL: 0.4 mg/dL (ref 0.3–1.2)
BUN: 16 mg/dL (ref 6–23)
CHLORIDE: 93 meq/L — AB (ref 96–112)
CO2: 24 meq/L (ref 19–32)
CREATININE: 1.17 mg/dL — AB (ref 0.50–1.10)
Calcium: 9.7 mg/dL (ref 8.4–10.5)
GFR calc Af Amer: 54 mL/min — ABNORMAL LOW (ref >90)
GFR, EST NON AFRICAN AMERICAN: 47 mL/min — AB (ref >90)
Glucose, Bld: 255 mg/dL — ABNORMAL HIGH (ref 70–99)
Potassium: 3.3 mEq/L — ABNORMAL LOW (ref 3.7–5.3)
Sodium: 139 mEq/L (ref 137–147)
Total Protein: 8.5 g/dL — ABNORMAL HIGH (ref 6.0–8.3)

## 2013-06-15 LAB — URINALYSIS, ROUTINE W REFLEX MICROSCOPIC
Glucose, UA: 100 mg/dL — AB
Hgb urine dipstick: NEGATIVE
KETONES UR: 15 mg/dL — AB
LEUKOCYTES UA: NEGATIVE
NITRITE: NEGATIVE
PROTEIN: 100 mg/dL — AB
Specific Gravity, Urine: 1.034 — ABNORMAL HIGH (ref 1.005–1.030)
Urobilinogen, UA: 1 mg/dL (ref 0.0–1.0)
pH: 5 (ref 5.0–8.0)

## 2013-06-15 LAB — CBC WITH DIFFERENTIAL/PLATELET
Basophils Absolute: 0 10*3/uL (ref 0.0–0.1)
Basophils Relative: 0 % (ref 0–1)
EOS PCT: 0 % (ref 0–5)
Eosinophils Absolute: 0 10*3/uL (ref 0.0–0.7)
HEMATOCRIT: 50.3 % — AB (ref 36.0–46.0)
HEMOGLOBIN: 16.6 g/dL — AB (ref 12.0–15.0)
Lymphocytes Relative: 10 % — ABNORMAL LOW (ref 12–46)
Lymphs Abs: 1.6 10*3/uL (ref 0.7–4.0)
MCH: 28.9 pg (ref 26.0–34.0)
MCHC: 33 g/dL (ref 30.0–36.0)
MCV: 87.5 fL (ref 78.0–100.0)
MONOS PCT: 2 % — AB (ref 3–12)
Monocytes Absolute: 0.4 10*3/uL (ref 0.1–1.0)
NEUTROS ABS: 13.8 10*3/uL — AB (ref 1.7–7.7)
Neutrophils Relative %: 88 % — ABNORMAL HIGH (ref 43–77)
Platelets: 308 10*3/uL (ref 150–400)
RBC: 5.75 MIL/uL — ABNORMAL HIGH (ref 3.87–5.11)
RDW: 14.2 % (ref 11.5–15.5)
WBC: 15.8 10*3/uL — ABNORMAL HIGH (ref 4.0–10.5)

## 2013-06-15 LAB — URINE MICROSCOPIC-ADD ON

## 2013-06-15 LAB — LIPASE, BLOOD: Lipase: 16 U/L (ref 11–59)

## 2013-06-15 MED ORDER — ONDANSETRON HCL 4 MG/2ML IJ SOLN
4.0000 mg | Freq: Once | INTRAMUSCULAR | Status: AC
  Administered 2013-06-15: 4 mg via INTRAVENOUS
  Filled 2013-06-15: qty 2

## 2013-06-15 MED ORDER — SODIUM CHLORIDE 0.9 % IV BOLUS (SEPSIS)
1000.0000 mL | Freq: Once | INTRAVENOUS | Status: AC
  Administered 2013-06-15: 1000 mL via INTRAVENOUS

## 2013-06-15 NOTE — ED Provider Notes (Signed)
CSN: [id number]     Arrival date & time 06/15/13  1656 History   First MD Initiated Contact with Patient 06/15/13 2051     Chief Complaint  Patient presents with  . Abdominal Pain  . Emesis  . Diarrhea     (Consider location/radiation/quality/duration/timing/severity/associated sxs/prior Treatment) Patient is a 68 y.o. female presenting with abdominal pain, vomiting, and diarrhea. The history is provided by the patient.  Abdominal Pain Pain location:  Periumbilical Pain quality: aching and cramping   Pain radiates to:  Does not radiate Pain severity:  Moderate Onset quality:  Gradual Duration:  4 days Timing:  Intermittent Progression:  Unchanged Chronicity:  New Context comment:  Started after starting new medication metformin and doxy for possible pneumonia Relieved by:  None tried Worsened by:  Eating Ineffective treatments:  Eating and liquids Associated symptoms: anorexia, diarrhea, nausea and vomiting   Associated symptoms: no chills, no constipation and no fever   Associated symptoms comment:  States for last 1 month she has had non-productive cough that is not getting better Risk factors: has not had multiple surgeries and no recent hospitalization   Risk factors comment:  Recently dx with DM and started on metformin Emesis Associated symptoms: abdominal pain and diarrhea   Associated symptoms: no chills   Diarrhea Associated symptoms: abdominal pain and vomiting   Associated symptoms: no chills and no fever     Past Medical History  Diagnosis Date  . Arthritis   . Asthma   . Hypertension   . Cancer    Past Surgical History  Procedure Laterality Date  . Eye surgery    . Tubal ligation    . Abdominal hysterectomy     No family history on file. History  Substance Use Topics  . Smoking status: Former Smoker    Types: Cigarettes    Quit date: 06/23/2004  . Smokeless tobacco: Not on file  . Alcohol Use: Yes   OB History   Grav Para Term Preterm  Abortions TAB SAB Ect Mult Living                 Review of Systems  Constitutional: Negative for fever and chills.  Gastrointestinal: Positive for nausea, vomiting, abdominal pain, diarrhea and anorexia. Negative for constipation.  All other systems reviewed and are negative.     Allergies  Review of patient's allergies indicates no known allergies.  Home Medications   Prior to Admission medications   Medication Sig Start Date End Date Taking? Authorizing Provider  aspirin 81 MG tablet Take 81 mg by mouth daily.   Yes Historical Provider, MD  Chlorphen-Pseudoephed-APAP (CORICIDIN D PO) Take by mouth.   Yes Historical Provider, MD  dextromethorphan (DELSYM) 30 MG/5ML liquid Take by mouth as needed for cough.   Yes Historical Provider, MD  doxycycline (VIBRAMYCIN) 100 MG capsule Take 1 capsule (100 mg total) by mouth 2 (two) times daily. 06/10/13  Yes Jessica C Copland, MD  guaiFENesin-codeine (ROBITUSSIN AC) 100-10 MG/5ML syrup Take 5 mLs by mouth 3 (three) times daily as needed for cough. 06/10/13  Yes Gay Filler Copland, MD  hydrochlorothiazide (HYDRODIURIL) 25 MG tablet Take 1 tablet (25 mg total) by mouth daily. 06/10/13  Yes Gay Filler Copland, MD  lisinopril (PRINIVIL,ZESTRIL) 10 MG tablet Take 1 tablet (10 mg total) by mouth daily. 06/10/13  Yes Gay Filler Copland, MD  meloxicam (MOBIC) 7.5 MG tablet Take 1 tablet (7.5 mg total) by mouth daily as needed for pain. 05/24/12  Yes Shanon Brow  Burt Ek, MD  metFORMIN (GLUCOPHAGE) 500 MG tablet Take 1 tablet (500 mg total) by mouth 2 (two) times daily with a meal. 06/10/13  Yes Jessica C Copland, MD  Mometasone Furo-Formoterol Fum 200-5 MCG/ACT AERO Inhale 2 puffs into the lungs 2 (two) times daily. 04/14/11  Yes Robyn Haber, MD   BP 122/74  Pulse 109  Temp(Src) 98.7 F (37.1 C) (Oral)  Resp 20  Wt 357 lb 8 oz (162.161 kg)  SpO2 95% Physical Exam  Nursing note and vitals reviewed. Constitutional: She is oriented to person, place, and time.  She appears well-developed and well-nourished. No distress.  obese  HENT:  Head: Normocephalic and atraumatic.  Mouth/Throat: Oropharynx is clear and moist.  Eyes: Conjunctivae and EOM are normal. Pupils are equal, round, and reactive to light.  Neck: Normal range of motion. Neck supple.  Cardiovascular: Normal rate, regular rhythm and intact distal pulses.   No murmur heard. Pulmonary/Chest: Effort normal and breath sounds normal. No respiratory distress. She has no wheezes. She has no rales.  Abdominal: Soft. She exhibits no distension. There is no tenderness. There is no rebound and no guarding.  Musculoskeletal: Normal range of motion. She exhibits no edema and no tenderness.  Neurological: She is alert and oriented to person, place, and time.  Skin: Skin is warm and dry. No rash noted. No erythema.  Psychiatric: She has a normal mood and affect. Her behavior is normal.    ED Course  Procedures (including critical care time) Labs Review Labs Reviewed  CBC WITH DIFFERENTIAL - Abnormal; Notable for the following:    WBC 15.8 (*)    RBC 5.75 (*)    Hemoglobin 16.6 (*)    HCT 50.3 (*)    Neutrophils Relative % 88 (*)    Neutro Abs 13.8 (*)    Lymphocytes Relative 10 (*)    Monocytes Relative 2 (*)    All other components within normal limits  COMPREHENSIVE METABOLIC PANEL - Abnormal; Notable for the following:    Potassium 3.3 (*)    Chloride 93 (*)    Glucose, Bld 255 (*)    Creatinine, Ser 1.17 (*)    Total Protein 8.5 (*)    Albumin 3.3 (*)    GFR calc non Af Amer 47 (*)    GFR calc Af Amer 54 (*)    All other components within normal limits  URINALYSIS, ROUTINE W REFLEX MICROSCOPIC - Abnormal; Notable for the following:    Color, Urine ORANGE (*)    APPearance TURBID (*)    Specific Gravity, Urine 1.034 (*)    Glucose, UA 100 (*)    Bilirubin Urine LARGE (*)    Ketones, ur 15 (*)    Protein, ur 100 (*)    All other components within normal limits  URINE  MICROSCOPIC-ADD ON - Abnormal; Notable for the following:    Squamous Epithelial / LPF MANY (*)    Bacteria, UA FEW (*)    Casts HYALINE CASTS (*)    All other components within normal limits  LIPASE, BLOOD    Imaging Review Dg Chest 2 View  06/15/2013   CLINICAL DATA:  Cough  EXAM: CHEST  2 VIEW  COMPARISON:  06/10/2013  FINDINGS: Chronic lung disease with mild scarring in the lung bases as noted previously. Negative for pneumonia. Negative for heart failure or effusion.  IMPRESSION: No active cardiopulmonary disease.   Electronically Signed   By: Franchot Gallo M.D.   On: 06/15/2013 21:54  EKG Interpretation None      MDM   Final diagnoses:  Dehydration  Abdominal cramps  Non-dose-related adverse reaction to medication    Patient with diagnosis of diabetes and was started on doxycycline and metformin on Thursday who presents with abdominal cramping, nausea, vomiting, diarrhea since starting medications. She states she's had a chronic cough for the last one month but denies productivity. No fevers or chills. Patient's abdominal pain is intermittent and currently on my exam patient has no focal abdominal pain but is mildly tachycardic and appears dehydrated.  CBC with a leukocytosis of 15 pounds and in hemoconcentrated with a hemoglobin of 16. CMP with hyperglycemia but otherwise fairly insignificant and a normal lipase. Chest x-ray within normal limits. Patient has positive bowel sounds. He'll most likely patient's symptoms from this bleed related to the new medications especially the metformin most likely causing the abdominal cramps.  Hydrate patient can give anti-emetics and reevaluate. At this time low suspicion for appendicitis, diverticulitis, pancreatitis or cholecystitis.  11:09 PM Pt feeling better after fluids.  Will po challenge.  12:13 AM' Pt tolerated po's will have pt decrease metformin dose and slowly increase.  She will stop doxy.  Will f/u with pcp if  worsening.  Blanchie Dessert, MD 06/16/13 [phone]

## 2013-06-15 NOTE — Telephone Encounter (Signed)
LMOM to CB. Pt should call ins and ask which brand of meter/testing strips they cover so that we can specify the brand on the Rx.

## 2013-06-15 NOTE — ED Notes (Signed)
Pt presents to department for evaluation of diffuse abdominal pain and N/V/D. Onset Friday. 7/10 pain at the time. Denies urinary symptoms. Last bowel movement yesterday was runny. Pt is alert and oriented x4. NAD.

## 2013-06-16 MED ORDER — ONDANSETRON HCL 4 MG PO TABS: 4.0000 mg | ORAL_TABLET | Freq: Four times a day (QID) | ORAL | Status: DC

## 2013-06-16 NOTE — Discharge Instructions (Signed)
Start metformin slowly (1/2 tab for 3 days then 1 full tab for 3-4 days then 1 tab 2 times a day) Abdominal Pain, Women Abdominal (stomach, pelvic, or belly) pain can be caused by many things. It is important to tell your doctor:  The location of the pain.  Does it come and go or is it present all the time?  Are there things that start the pain (eating certain foods, exercise)?  Are there other symptoms associated with the pain (fever, nausea, vomiting, diarrhea)? All of this is helpful to know when trying to find the cause of the pain. CAUSES   Stomach: virus or bacteria infection, or ulcer.  Intestine: appendicitis (inflamed appendix), regional ileitis (Crohn's disease), ulcerative colitis (inflamed colon), irritable bowel syndrome, diverticulitis (inflamed diverticulum of the colon), or cancer of the stomach or intestine.  Gallbladder disease or stones in the gallbladder.  Kidney disease, kidney stones, or infection.  Pancreas infection or cancer.  Fibromyalgia (pain disorder).  Diseases of the female organs:  Uterus: fibroid (non-cancerous) tumors or infection.  Fallopian tubes: infection or tubal pregnancy.  Ovary: cysts or tumors.  Pelvic adhesions (scar tissue).  Endometriosis (uterus lining tissue growing in the pelvis and on the pelvic organs).  Pelvic congestion syndrome (female organs filling up with blood just before the menstrual period).  Pain with the menstrual period.  Pain with ovulation (producing an egg).  Pain with an IUD (intrauterine device, birth control) in the uterus.  Cancer of the female organs.  Functional pain (pain not caused by a disease, may improve without treatment).  Psychological pain.  Depression. DIAGNOSIS  Your doctor will decide the seriousness of your pain by doing an examination.  Blood tests.  X-rays.  Ultrasound.  CT scan (computed tomography, special type of X-ray).  MRI (magnetic resonance  imaging).  Cultures, for infection.  Barium enema (dye inserted in the large intestine, to better view it with X-rays).  Colonoscopy (looking in intestine with a lighted tube).  Laparoscopy (minor surgery, looking in abdomen with a lighted tube).  Major abdominal exploratory surgery (looking in abdomen with a large incision). TREATMENT  The treatment will depend on the cause of the pain.   Many cases can be observed and treated at home.  Over-the-counter medicines recommended by your caregiver.  Prescription medicine.  Antibiotics, for infection.  Birth control pills, for painful periods or for ovulation pain.  Hormone treatment, for endometriosis.  Nerve blocking injections.  Physical therapy.  Antidepressants.  Counseling with a psychologist or psychiatrist.  Minor or major surgery. HOME CARE INSTRUCTIONS   Do not take laxatives, unless directed by your caregiver.  Take over-the-counter pain medicine only if ordered by your caregiver. Do not take aspirin because it can cause an upset stomach or bleeding.  Try a clear liquid diet (broth or water) as ordered by your caregiver. Slowly move to a bland diet, as tolerated, if the pain is related to the stomach or intestine.  Have a thermometer and take your temperature several times a day, and record it.  Bed rest and sleep, if it helps the pain.  Avoid sexual intercourse, if it causes pain.  Avoid stressful situations.  Keep your follow-up appointments and tests, as your caregiver orders.  If the pain does not go away with medicine or surgery, you may try:  Acupuncture.  Relaxation exercises (yoga, meditation).  Group therapy.  Counseling. SEEK MEDICAL CARE IF:   You notice certain foods cause stomach pain.  Your home care treatment  is not helping your pain.  You need stronger pain medicine.  You want your IUD removed.  You feel faint or lightheaded.  You develop nausea and vomiting.  You  develop a rash.  You are having side effects or an allergy to your medicine. SEEK IMMEDIATE MEDICAL CARE IF:   Your pain does not go away or gets worse.  You have a fever.  Your pain is felt only in portions of the abdomen. The right side could possibly be appendicitis. The left lower portion of the abdomen could be colitis or diverticulitis.  You are passing blood in your stools (bright red or black tarry stools, with or without vomiting).  You have blood in your urine.  You develop chills, with or without a fever.  You pass out. MAKE SURE YOU:   Understand these instructions.  Will watch your condition.  Will get help right away if you are not doing well or get worse. Document Released: 11/05/2006 Document Revised: 04/02/2011 Document Reviewed: 11/25/2008 [facility] Patient Information 2014 [place], [place].

## 2013-06-17 ENCOUNTER — Telehealth: Payer: Self-pay

## 2013-06-17 NOTE — Telephone Encounter (Signed)
Patient called and states she missed a call from Dr. Lorelei Pont. Please return call and advise.

## 2013-06-17 NOTE — Telephone Encounter (Signed)
Staff Message from Dr. Lorelei Pont- Pt was recently in the ED. Pt needs to RTC for follow up with Dr. Lorelei Pont.  LM for Pt on [name]'s cell phone- to RTC and rtn call.

## 2013-06-18 NOTE — Telephone Encounter (Signed)
Hi Pamala Hurry- can you please call as we discussed?  Thanks!

## 2013-06-18 NOTE — Telephone Encounter (Signed)
Dr Lorelei Pont forwarding this Naplate

## 2013-06-18 NOTE — Telephone Encounter (Signed)
[phone] is the number for her insurance company to approve testing supplies.

## 2013-06-18 NOTE — Telephone Encounter (Signed)
Spoke w/pt who agreed to RTC to see Dr Lorelei Pont next week. She stated she is feeling better. Pt agreed to call ins and find out what brand of testing strips they cover and call me back so that I can order them for her.

## 2013-06-22 ENCOUNTER — Ambulatory Visit (INDEPENDENT_AMBULATORY_CARE_PROVIDER_SITE_OTHER): Payer: Medicare Other | Admitting: Internal Medicine

## 2013-06-22 VITALS — BP 126/84 | HR 93 | Temp 98.1°F | Resp 18 | Ht 65.0 in | Wt 353.6 lb

## 2013-06-22 DIAGNOSIS — J189 Pneumonia, unspecified organism: Secondary | ICD-10-CM

## 2013-06-22 DIAGNOSIS — E119 Type 2 diabetes mellitus without complications: Secondary | ICD-10-CM

## 2013-06-22 DIAGNOSIS — R059 Cough, unspecified: Secondary | ICD-10-CM

## 2013-06-22 DIAGNOSIS — R05 Cough: Secondary | ICD-10-CM

## 2013-06-22 MED ORDER — GLUCOSE BLOOD VI STRP: ORAL_STRIP | Status: DC

## 2013-06-22 MED ORDER — BLOOD GLUCOSE METER KIT: PACK | Status: DC

## 2013-06-22 MED ORDER — BENZONATATE 200 MG PO CAPS: 200.0000 mg | ORAL_CAPSULE | Freq: Two times a day (BID) | ORAL | Status: DC | PRN

## 2013-06-22 MED ORDER — FREESTYLE SYSTEM KIT: 1.0000 | PACK | Status: DC | PRN

## 2013-06-22 MED ORDER — ONETOUCH ULTRASOFT LANCETS MISC: Status: DC

## 2013-06-22 MED ORDER — AZITHROMYCIN 500 MG PO TABS: 500.0000 mg | ORAL_TABLET | Freq: Every day | ORAL | Status: DC

## 2013-06-22 MED ORDER — ACCU-CHEK MULTICLIX LANCETS MISC: Status: DC

## 2013-06-22 NOTE — Patient Instructions (Signed)
Zithromax as directed. Finish all the antibiotic. Tessalon perle as needed for cough , take with a full glass of water. Weight loss diet, no smoking, return to the office if your condition worsens if you develop any other symptoms or if your symptoms do not improve.

## 2013-06-22 NOTE — Telephone Encounter (Signed)
After being transferred back and forth several times, finally a helpful [employer] rep found me the answer. Plan prefers Accu-chek or One-touch. I will send in new Rx with this specified.

## 2013-06-22 NOTE — Progress Notes (Signed)
   Subjective:    Patient ID: Christine Parrish, female    DOB: 1945-06-24, 68 y.o.   MRN: [id number]  HPI 68 year old female return for followup eval for CC: cough and sob . Pt is a 68 year old black female who first presented to [facility] on 5/20 with same and was eval by Dr. Janett Billow Copland. At that time she had an elevated d-diamer and elevated wbc, and xray with a lll infiltrate and was started on antibiotics and otc cough suppressants. (Doxycycline/robitussin/delsym) She also had a ct angi for the elevated d-diamer on 5/21 which confirmed the infiltrate in the lll and was negative for a PE. She took 4 days of  the 10 day course of antibiotic then stopped it because of NVD. She was seen in the ER on 5/26  And treated with iv antibiotics and antiemetics and her symptoms improved but she returns with persistent cough, clear sputum, no fever, no chest pain but does continue to have sob on exertion which she says is usual for her. PMH is positive sfor multiple pulmonary chronic diseases sarcoid, asthma, sleep apnea, smoking, she also has htn and diabetes and obesity.   Review of Systems  Constitutional: Negative for fever, chills, diaphoresis, activity change, appetite change and fatigue.  Respiratory: Positive for cough and shortness of breath. Negative for chest tightness and wheezing.   Cardiovascular: Negative for chest pain.  All other systems reviewed and are negative.      Objective:   Physical Exam  Vitals reviewed. Constitutional: She is oriented to person, place, and time. She appears well-developed and well-nourished.  HENT:  Head: Normocephalic and atraumatic.  Right Ear: External ear normal.  Left Ear: External ear normal.  Nose: Nose normal.  Mouth/Throat: Oropharynx is clear and moist.  Eyes: Conjunctivae and EOM are normal. Pupils are equal, round, and reactive to light.  Neck: Normal range of motion.  Cardiovascular: Normal rate, regular rhythm, normal heart sounds and intact  distal pulses.   Pulmonary/Chest: Effort normal and breath sounds normal.  slighstly diminished bs in the ll area  Abdominal: Soft. Bowel sounds are normal.  Musculoskeletal: Normal range of motion.  Neurological: She is alert and oriented to person, place, and time.  Skin: Skin is warm and dry.  Psychiatric: She has a normal mood and affect. Her behavior is normal. Judgment and thought content normal.          Assessment & Plan:  Pt has only completed 4 out of 10 days of hte antibitotic and does not want to restart the doxy since she feels this has given her the n/v/d that she had to have treated in the er. She continues to cough but is afebrile and has clear sputum. In light of hte pneumonia on xray and ct of 5/20 and 5/21 I will treat her with Zithromax and an prescriptive cough medicine, tessalon Perles. Encourage no smoking and weight loss.

## 2013-06-22 NOTE — Addendum Note (Signed)
Addended by: Boris Lown on: 06/22/2013 09:56 PM   Modules accepted: Orders

## 2013-06-23 NOTE — Telephone Encounter (Signed)
LMOM for daughter that new Rxs for testing supplies that should be covered by ins have been sent to pharm for pt.

## 2013-08-13 ENCOUNTER — Encounter: Payer: Self-pay | Admitting: *Deleted

## 2013-08-26 ENCOUNTER — Ambulatory Visit (INDEPENDENT_AMBULATORY_CARE_PROVIDER_SITE_OTHER): Payer: Medicare Other | Admitting: Cardiology

## 2013-08-26 ENCOUNTER — Encounter: Payer: Self-pay | Admitting: Cardiology

## 2013-08-26 VITALS — BP 100/70 | HR 116 | Ht 67.0 in | Wt 339.0 lb

## 2013-08-26 DIAGNOSIS — R0602 Shortness of breath: Secondary | ICD-10-CM

## 2013-08-26 DIAGNOSIS — R9431 Abnormal electrocardiogram [ECG] [EKG]: Secondary | ICD-10-CM

## 2013-08-26 NOTE — Progress Notes (Signed)
HPI The patient presents for evaluation of dyspnea. She has no past cardiac history. However, she reports that she's been getting dyspnea for some time. I did see an emergency room visit in May at which she was evaluated to rule out pulmonary embolism. She had a CT at that time which was negative for any evidence of PE. Her situation is complicated by morbid obesity. She is very sedentary and essentially is able to sit on her bed and move around her apartment. With this level of activity is walking on level ground she will get short of breath. She doesn't describe any PND or orthopnea. She doesn't have any weight gain or edema. She's not having any palpitations, presyncope or syncope. She's not describing any chest pressure, neck or arm discomfort.  No Known Allergies  Current Outpatient Prescriptions  Medication Sig Dispense Refill  . benzonatate (TESSALON) 200 MG capsule Take 1 capsule (200 mg total) by mouth 2 (two) times daily as needed for cough.  20 capsule  0  . Blood Glucose Monitoring Suppl (BLOOD GLUCOSE METER) kit Test blood sugar daily. Dx code: 250.92  1 each  0  . Chlorphen-Pseudoephed-APAP (CORICIDIN D PO) Take by mouth.      Marland Kitchen glucose blood test strip Test blood sugar daily. Dx code: 250.92.  100 each  3  . glucose blood test strip Use as instructed  100 each  12  . glucose monitoring kit (FREESTYLE) monitoring kit 1 each by Does not apply route as needed for other.  1 each  0  . hydrochlorothiazide (HYDRODIURIL) 25 MG tablet Take 1 tablet (25 mg total) by mouth daily.  90 tablet  3  . Lancets (ACCU-CHEK MULTICLIX) lancets Use as instructed  100 each  12  . Lancets (ONETOUCH ULTRASOFT) lancets Test blood sugar daily. Dx code: 250.92  100 each  3  . lisinopril (PRINIVIL,ZESTRIL) 10 MG tablet Take 1 tablet (10 mg total) by mouth daily.  90 tablet  3  . Mometasone Furo-Formoterol Fum 200-5 MCG/ACT AERO Inhale 2 puffs into the lungs 2 (two) times daily.  1 Inhaler  3  . ondansetron  (ZOFRAN) 4 MG tablet Take 1 tablet (4 mg total) by mouth every 6 (six) hours.  12 tablet  0   No current facility-administered medications for this visit.    Past Medical History  Diagnosis Date  . Arthritis   . Asthma   . Hypertension   . Cancer     Past Surgical History  Procedure Laterality Date  . Eye surgery    . Tubal ligation    . Abdominal hysterectomy      No family history on file.  History   Social History  . Marital Status: Widowed    Spouse Name: N/A    Number of Children: N/A  . Years of Education: N/A   Occupational History  . Not on file.   Social History Main Topics  . Smoking status: Former Smoker    Types: Cigarettes    Quit date: 06/23/2004  . Smokeless tobacco: Not on file  . Alcohol Use: Yes  . Drug Use: No  . Sexual Activity: No   Other Topics Concern  . Not on file   Social History Narrative  . No narrative on file    ROS:  Positive for headaches, dizziness,  tinnitus, vertigo, nausea, vomiting, diarrhea, constipation, reflux, back pain. Otherwise as stated in the HPI and negative for all other systems.  PHYSICAL EXAM BP 100/70  Pulse 116  Ht 5' 7"  (1.702 m)  Wt 339 lb (153.769 kg)  BMI 53.08 kg/m2 GENERAL:  Well appearing HEENT:  Pupils equal round and reactive, fundi not visualized, oral mucosa unremarkable NECK:  No jugular venous distention, waveform within normal limits, carotid upstroke brisk and symmetric, no bruits, no thyromegaly LYMPHATICS:  No cervical, inguinal adenopathy LUNGS:  Clear to auscultation bilaterally BACK:  No CVA tenderness CHEST:  Unremarkable HEART:  PMI not displaced or sustained,S1 and S2 within normal limits, no S3, no S4, no clicks, no rubs, no murmurs ABD:  Flat, positive bowel sounds normal in frequency in pitch, no bruits, no rebound, no guarding, no midline pulsatile mass, no hepatomegaly, no splenomegaly EXT:  2 plus pulses throughout, no edema, no cyanosis no clubbing SKIN:  No rashes no  nodules NEURO:  Cranial nerves II through XII grossly intact, motor grossly intact throughout PSYCH:  Cognitively intact, oriented to person place and time   EKG:  Sinus tachycardia, rate 116, right axis deviation, no acute ST-T wave changes. 08/26/2013  ASSESSMENT AND PLAN  DYSPNEA:  This is the predominant complaint. I suspect that this is related to her morbid obesity and deconditioning. However, will check a BNP and an echocardiogram. If these are unremarkable then no further cardiac workup would be suggested. I do not suspect an ischemic etiology.  ABNORMAL EKG:  She does seem to have some right axis deviation. She might have some RV strain with hypoventilation obesity syndrome. Again this will be evaluated with echo.  (Of note I was unable to retreive her most recent EKG.)  OBESITY:  This is certainly the most significant problem she faces. We discussed weight loss.

## 2013-08-26 NOTE — Patient Instructions (Signed)
Your physician recommends that you schedule a follow-up appointment in: as needed  We are ordering labs for you to get done as soon as you can  We are also ordering an Echo test to look at your heart

## 2013-08-27 LAB — BRAIN NATRIURETIC PEPTIDE: Brain Natriuretic Peptide: 6.8 pg/mL (ref 0.0–100.0)

## 2013-09-03 NOTE — Progress Notes (Signed)
Pt. Informed of her lab results

## 2013-09-06 ENCOUNTER — Ambulatory Visit (INDEPENDENT_AMBULATORY_CARE_PROVIDER_SITE_OTHER): Payer: Medicare Other | Admitting: Family Medicine

## 2013-09-06 ENCOUNTER — Ambulatory Visit (INDEPENDENT_AMBULATORY_CARE_PROVIDER_SITE_OTHER): Payer: Medicare Other

## 2013-09-06 VITALS — BP 124/84 | HR 116 | Temp 98.3°F | Resp 20 | Ht 66.0 in | Wt 336.0 lb

## 2013-09-06 DIAGNOSIS — R0989 Other specified symptoms and signs involving the circulatory and respiratory systems: Secondary | ICD-10-CM

## 2013-09-06 DIAGNOSIS — R05 Cough: Secondary | ICD-10-CM

## 2013-09-06 DIAGNOSIS — R0609 Other forms of dyspnea: Secondary | ICD-10-CM

## 2013-09-06 DIAGNOSIS — J189 Pneumonia, unspecified organism: Secondary | ICD-10-CM

## 2013-09-06 DIAGNOSIS — G4730 Sleep apnea, unspecified: Secondary | ICD-10-CM

## 2013-09-06 DIAGNOSIS — J45909 Unspecified asthma, uncomplicated: Secondary | ICD-10-CM

## 2013-09-06 DIAGNOSIS — R0689 Other abnormalities of breathing: Secondary | ICD-10-CM

## 2013-09-06 DIAGNOSIS — R0600 Dyspnea, unspecified: Secondary | ICD-10-CM

## 2013-09-06 DIAGNOSIS — D869 Sarcoidosis, unspecified: Secondary | ICD-10-CM

## 2013-09-06 DIAGNOSIS — R059 Cough, unspecified: Secondary | ICD-10-CM

## 2013-09-06 DIAGNOSIS — R053 Chronic cough: Secondary | ICD-10-CM

## 2013-09-06 LAB — POCT CBC
Granulocyte percent: 74.7 %G (ref 37–80)
HCT, POC: 42.9 % (ref 37.7–47.9)
Hemoglobin: 13.6 g/dL (ref 12.2–16.2)
LYMPH, POC: 2.7 (ref 0.6–3.4)
MCH: 27.4 pg (ref 27–31.2)
MCHC: 31.7 g/dL — AB (ref 31.8–35.4)
MCV: 86.4 fL (ref 80–97)
MID (CBC): 0.3 (ref 0–0.9)
MPV: 9.3 fL (ref 0–99.8)
PLATELET COUNT, POC: 251 10*3/uL (ref 142–424)
POC Granulocyte: 9 — AB (ref 2–6.9)
POC LYMPH PERCENT: 22.9 %L (ref 10–50)
POC MID %: 2.4 % (ref 0–12)
RBC: 4.97 M/uL (ref 4.04–5.48)
RDW, POC: 15.1 %
WBC: 12 10*3/uL — AB (ref 4.6–10.2)

## 2013-09-06 LAB — GLUCOSE, POCT (MANUAL RESULT ENTRY): POC Glucose: 145 mg/dl — AB (ref 70–99)

## 2013-09-06 MED ORDER — LEVOFLOXACIN 750 MG PO TABS: 750.0000 mg | ORAL_TABLET | Freq: Every day | ORAL | Status: DC

## 2013-09-06 MED ORDER — HYDROCODONE-HOMATROPINE 5-1.5 MG/5ML PO SYRP: 5.0000 mL | ORAL_SOLUTION | Freq: Three times a day (TID) | ORAL | Status: DC | PRN

## 2013-09-06 NOTE — Patient Instructions (Signed)

## 2013-09-06 NOTE — Progress Notes (Signed)
Subjective:    Patient ID: Christine Parrish, female    DOB: 1945/09/12, 68 y.o.   MRN: [id number]  Cough Associated symptoms include chills, a fever and shortness of breath. Pertinent negatives include no rhinorrhea.  Shortness of Breath Associated symptoms include a fever. Pertinent negatives include no rhinorrhea.   Chief Complaint  Patient presents with   Cough    Says has had cough since May, become worse within last couple weeks   Shortness of Breath    Says "for one year or so"   Nasal Congestion    X 3-4 weeks   This chart was scribed for Delman Cheadle, MD by Thea Alken, ED Scribe. This patient was seen in room 2 and the patient's care was started at 2:36 PM.  HPI Comments: Christine Parrish is a 68 y.o. female who presents to the [facility] complaining of multiple symptoms. Pt was seen by Dr. Lorelei Pont 3 months ago for SOB. CXR showed small infiltrate in left lung base and white count was mildly elevated. She was prescribed doxycycline and robitussin. She was referred to cardiology due to SOB and CP. Ddimer was elevated so CTA was obtained which confirmed patchy left face infiltrate. Repeat X-ray 5 days later showed clearing of infection. She did not finish coarse of abx due to diarrhea and was seen in clinic later that week for continued URI symptoms so she was placed on zpac with prn tessalon. She was evaluated by cardiology 10 days prior due to dyspnea. Dr. Percival Spanish thought this was due to morbid obese and deconditioning as well as mild RV strain due hypoventilation obesity syndrome. She has echocardiogram pending but fortunately VNP was normal. Hx sarcoidosis and asthma noted on chart. She also is not using Cpap machine despite sleep apnea.  Pt reports she's had a constant cough since she had pneumonia in May. Pt states the tessalon and robiussin prescribed at that time did not help. She describes cough as dry but often bring up mucous. She reports she noticed blood  in mucous once last week. Pt reports associated back pain due to cough, fever and chills. Pt reports she has taken 4 doses of left over doxycyline for the cough. She denies congestion and rhinorrhea. Pt denies using cpap machine due to it being broken. She reports she hs not checked her sugar levels in a while due to not feeling well.     Past Medical History  Diagnosis Date   Arthritis    Asthma    Hypertension    Cancer     Cervical   Sleep apnea     No CPAP (broken)   DM (diabetes mellitus)    Past Surgical History  Procedure Laterality Date   Eye surgery     Tubal ligation     Abdominal hysterectomy     Prior to Admission medications   Medication Sig Start Date End Date Taking? Authorizing Provider  Blood Glucose Monitoring Suppl (BLOOD GLUCOSE METER) kit Test blood sugar daily. Dx code: 250.92 06/22/13  Yes Heather M Marte, PA-C  Chlorphen-Pseudoephed-APAP (CORICIDIN D PO) Take by mouth.   Yes Historical Provider, MD  glucose blood test strip Test blood sugar daily. Dx code: 67.92. 06/22/13  Yes Collene Leyden, PA-C  glucose blood test strip Use as instructed 06/22/13  Yes Boris Lown, MD  glucose monitoring kit (FREESTYLE) monitoring kit 1 each by Does not apply route as needed for other. 06/22/13  Yes Sheryl L  Benjaman Lobe, MD  hydrochlorothiazide (HYDRODIURIL) 25 MG tablet Take 1 tablet (25 mg total) by mouth daily. 06/10/13  Yes Gay Filler Copland, MD  Lancets (ACCU-CHEK MULTICLIX) lancets Use as instructed 06/22/13  Yes Boris Lown, MD  Lancets [facility] ULTRASOFT) lancets Test blood sugar daily. Dx code: 250.92 06/22/13  Yes Heather Elnora Morrison, PA-C  lisinopril (PRINIVIL,ZESTRIL) 10 MG tablet Take 1 tablet (10 mg total) by mouth daily. 06/10/13  Yes Jessica C Copland, MD  Mometasone Furo-Formoterol Fum 200-5 MCG/ACT AERO Inhale 2 puffs into the lungs 2 (two) times daily. 04/14/11  Yes Robyn Haber, MD  benzonatate (TESSALON) 200 MG capsule Take 1 capsule (200 mg total)  by mouth 2 (two) times daily as needed for cough. 06/22/13   Boris Lown, MD   Review of Systems  Constitutional: Positive for fever and chills.  HENT: Negative for congestion and rhinorrhea.   Respiratory: Positive for cough and shortness of breath.   Musculoskeletal: Positive for back pain.   BP 124/84   Pulse 116   Temp(Src) 98.3 F (36.8 C) (Oral)   Resp 20   Ht 5' 6"  (1.676 m)   Wt 336 lb (152.409 kg)   BMI 54.26 kg/m2   SpO2 94% Objective:   Physical Exam  Nursing note and vitals reviewed. Constitutional: She is oriented to person, place, and time. She appears well-developed and well-nourished. No distress.  HENT:  Head: Normocephalic and atraumatic.  Right Ear: Tympanic membrane normal.  Left Ear: Tympanic membrane is erythematous.  Mouth/Throat: Oropharynx is clear and moist. No oropharyngeal exudate, posterior oropharyngeal edema or posterior oropharyngeal erythema.  A lot of clear mucous and post nasal drip.  Eyes: Conjunctivae and EOM are normal.  Neck: Neck supple.  Cardiovascular: Normal rate, regular rhythm and normal heart sounds.  Exam reveals no gallop and no friction rub.   No murmur heard. Pulmonary/Chest: Effort normal. No respiratory distress. She has decreased breath sounds. She has no wheezes. She has no rales. She exhibits no tenderness.  Musculoskeletal: Normal range of motion.  Neurological: She is alert and oriented to person, place, and time.  Skin: Skin is warm and dry.  Psychiatric: She has a normal mood and affect. Her behavior is normal.   [facility] reading (PRIMARY) by Brigitte Pulse no changes from prior.    Assessment & Plan:   1. ASTHMA   2. Sarcoidosis   3. SLEEP APNEA   4. CAP (community acquired pneumonia)   49. Dyspnea and respiratory abnormalities   6. Chronic cough    Meds ordered this encounter  Medications   levofloxacin (LEVAQUIN) 750 MG tablet    Sig: Take 1 tablet (750 mg total) by mouth daily.    Dispense:  7 tablet    Refill:  0     HYDROcodone-homatropine (HYCODAN) 5-1.5 MG/5ML syrup    Sig: Take 5 mLs by mouth every 8 (eight) hours as needed for cough.    Dispense:  120 mL    Refill:  0   Orders Placed This Encounter  Procedures   DG Chest 2 View    Order Specific Question:  Reason for Exam (SYMPTOM  OR DIAGNOSIS REQUIRED)    Answer:  cough    Order Specific Question:  Preferred imaging location?    Answer:  External   Ambulatory referral to Pulmonology    Referral Priority:  Routine    Referral Type:  Consultation    Referral Reason:  Specialty Services Required    Requested Specialty:  Pulmonary Disease  Number of Visits Requested:  1   Ambulatory referral to Sleep Studies    Referral Priority:  Routine    Referral Type:  Consultation    Referral Reason:  Specialty Services Required    Number of Visits Requested:  1   POCT CBC   POCT glucose (manual entry)    Due to risk factors of obese hyperventilation, untreated OSA and hx asthma and sarcoidosis will treat levaquin due to hx of partial doxycycline and zpac within the last 3 months. Pt really want symptomatic cough medicine so will refer to pulmonologist.    Pt wants to be referred to nutritionist but has the desire to go to the same facility as her friend. Pt will call with name of the facility  Pt want to get her sleep apnea retested and restart cpap machine. Will refer pt for a sleep study   I personally performed the services described in this documentation, which was scribed in my presence. The recorded information has been reviewed, changed as needed, and is accurate.   Delman Cheadle, MD MPH

## 2013-09-10 ENCOUNTER — Telehealth: Payer: Self-pay

## 2013-09-10 NOTE — Telephone Encounter (Signed)
Patient states the medication prescribed "Hydrocodone Homatropine (Hycodan) 5-1.5mg  syrup has made her very sick to her stomach. Patient is requesting a different cough/pain medicine to be called in to [facility] at [phone].

## 2013-09-11 NOTE — Telephone Encounter (Signed)
Pt had the same hycodan cough syrup 2 yrs ago. The homatropine does cause stomach upset if it is taken more than prescribed so try to reduce the frequency or amount of use - no more than 1 teaspoon every 8 hours.  She states that robitussin AC (the codeine type) and benzonatate prescribed prev didn't work for her.  She can try otc delsym alternatively.

## 2013-09-12 NOTE — Telephone Encounter (Signed)
Pt has stopped the hydrocodone. She is now taking delsym and nyquil. She feels the abx is helping and she is not coughing as much. She will CB if she has any other problems.

## 2013-09-14 ENCOUNTER — Telehealth: Payer: Self-pay | Admitting: Family Medicine

## 2013-09-14 NOTE — Telephone Encounter (Signed)
Received paperwork from [facility] out of [facility] concerning orders for a cpap machine.  Explained that I do not know how to complete this form.  She will have her sleep study in a few days.  Asked her to take the form with her  (she will pick it up).  If she needs me to sign the form after the parameters are entered that is fine

## 2013-09-15 ENCOUNTER — Ambulatory Visit (INDEPENDENT_AMBULATORY_CARE_PROVIDER_SITE_OTHER): Payer: Medicare Other | Admitting: Internal Medicine

## 2013-09-15 ENCOUNTER — Ambulatory Visit (HOSPITAL_COMMUNITY)
Admission: RE | Admit: 2013-09-15 | Discharge: 2013-09-15 | Disposition: A | Discharge status: Home / Self Care | Payer: Medicare Other | Source: Ambulatory Visit | Attending: Cardiology | Admitting: Cardiology

## 2013-09-15 ENCOUNTER — Encounter: Payer: Self-pay | Admitting: Internal Medicine

## 2013-09-15 VITALS — BP 126/78 | HR 118 | Temp 99.0°F | Ht 67.0 in | Wt 335.0 lb

## 2013-09-15 DIAGNOSIS — R0602 Shortness of breath: Secondary | ICD-10-CM

## 2013-09-15 DIAGNOSIS — R9431 Abnormal electrocardiogram [ECG] [EKG]: Secondary | ICD-10-CM

## 2013-09-15 DIAGNOSIS — R0609 Other forms of dyspnea: Secondary | ICD-10-CM | POA: Insufficient documentation

## 2013-09-15 DIAGNOSIS — R05 Cough: Secondary | ICD-10-CM

## 2013-09-15 DIAGNOSIS — R0989 Other specified symptoms and signs involving the circulatory and respiratory systems: Principal | ICD-10-CM | POA: Insufficient documentation

## 2013-09-15 DIAGNOSIS — I517 Cardiomegaly: Secondary | ICD-10-CM

## 2013-09-15 DIAGNOSIS — D869 Sarcoidosis, unspecified: Secondary | ICD-10-CM

## 2013-09-15 DIAGNOSIS — R058 Other specified cough: Secondary | ICD-10-CM

## 2013-09-15 DIAGNOSIS — I10 Essential (primary) hypertension: Secondary | ICD-10-CM

## 2013-09-15 DIAGNOSIS — R059 Cough, unspecified: Secondary | ICD-10-CM

## 2013-09-15 MED ORDER — VALSARTAN 80 MG PO TABS: 80.0000 mg | ORAL_TABLET | Freq: Every day | ORAL | Status: DC

## 2013-09-15 MED ORDER — PREDNISONE 10 MG PO TABS: ORAL_TABLET | ORAL | Status: DC

## 2013-09-15 NOTE — Patient Instructions (Addendum)
Stop lisinopril   Start valsartan 80 mg Take 30-60 min before first meal of the day   Prednisone 10 mg take  4 each am x 2 days,   2 each am x 2 days,  1 each am x 2 days and stop   Try prilosec 20mg   Take 30-60 min before first meal of the day and Pepcid 20 mg one bedtime until return  GERD (REFLUX)  is an extremely common cause of respiratory symptoms, many times with no significant heartburn at all.    It can be treated with medication, but also with lifestyle changes including avoidance of late meals, excessive alcohol, smoking cessation, and avoid fatty foods, chocolate, peppermint, colas, red wine, and acidic juices such as orange juice.  NO MINT OR MENTHOL PRODUCTS SO NO COUGH DROPS  USE SUGARLESS CANDY INSTEAD (jolley ranchers or Stover's)  NO OIL BASED VITAMINS - use powdered substitutes.   Please schedule a follow up office visit in 4 weeks, sooner if needed

## 2013-09-15 NOTE — Progress Notes (Signed)
2D Echocardiogram Complete.  09/15/2013   Aryka Coonradt, RDCS  

## 2013-09-15 NOTE — Progress Notes (Signed)
Subjective:     Patient ID: Christine Parrish, female   DOB: 07/04/1945  MRN: [id number]  HPI   43 yobf quit smoking in 2005 eval for sarcoid in 1987 dx by mediastinoscopy took pred x a few months for cough that resolved completely p quit smoking but started coughing again in April 2015 and dx as pna in May but cough persisted and therefore referred back to pulmonary clinic on 09/15/2013  by Delman Cheadle at [facility]   09/15/2013 1st [facility] office visit/ Sayward Horvath  Chief Complaint  Patient presents with  . Pulmonary Consult    Referred by Dr. Alver Sorrow for eval of Sarcoid.  Pt states that she was dxed with Sarcoid in 1987. She states never was bothered by any symptoms until in May 2015 when she was dxed with PNA. She c/o non prod cough and DOE with walking approx 50 ft.   indolent onset cough April 2015 while on ACEi  so severe vomited, now dry cough p abx esp at hs / worse with perfumes  Cough tends to be worse outside with walking also only able to walk only about 50 ft  No obvious day to day or daytime variabilty or assoc cp or chest tightness, subjective wheeze overt sinus or hb symptoms. No unusual exp hx or h/o childhood pna/ asthma or knowledge of premature birth.  Sleeping ok without nocturnal  or early am exacerbation  of respiratory  c/o's or need for noct saba. Also denies any obvious fluctuation of symptoms with weather or environmental changes or other aggravating or alleviating factors except as outlined above   Current Medications, Allergies, Complete Past Medical History, Past Surgical History, Family History, and Social History were reviewed in [employer] record.  ROS  The following are not active complaints unless bolded sore throat, dysphagia, dental problems, itching, sneezing,  nasal congestion or excess/ purulent secretions, ear ache,   fever, chills, sweats, unintended wt loss, pleuritic or exertional cp, hemoptysis,  orthopnea pnd or leg  swelling, presyncope, palpitations, heartburn, abdominal pain, anorexia, nausea, vomiting, diarrhea  or change in bowel or urinary habits, change in stools or urine, dysuria,hematuria,  rash, arthralgias, visual complaints, headache, numbness weakness or ataxia or problems with walking or coordination,  change in mood/affect or memory.           Review of Systems     Objective:   Physical Exam Hoarse amb bf morbidly obese unable to get on exam table even with assistance.  Wt Readings from Last 3 Encounters:  09/15/13 335 lb (151.955 kg)  09/06/13 336 lb (152.409 kg)  08/26/13 339 lb (153.769 kg)     HEENT: nl dentition, turbinates, and orophanx. Nl external ear canals without cough reflex   NECK :  without JVD/Nodes/TM/ nl carotid upstrokes bilaterally   LUNGS: no acc muscle use, clear to A and P bilaterally without cough on insp or exp maneuvers   CV:  RRR  no s3 or murmur or increase in P2, no edema   ABD:  soft and nontender with nl excursion in the supine position. No bruits or organomegaly, bowel sounds nl  MS:  warm without deformities, calf tenderness, cyanosis or clubbing  SKIN: warm and dry without lesions    NEURO:  alert, approp, no deficits      CXR  09/06/13 Upper normal heart size.  Tortuous aorta.  Mediastinal contours and pulmonary vascularity normal.  Minimal atelectasis or scarring in lingula unchanged.  Lungs  otherwise clear.  No pleural effusion or pneumothorax.  Bones unremarkable           Assessment:

## 2013-09-16 ENCOUNTER — Telehealth: Payer: Self-pay

## 2013-09-16 NOTE — Telephone Encounter (Signed)
COPLAND - patient says that her arthritis started acting up and that she found some of her old medication that you prescribed her.  She says it helped and wants to know if she can get refills on two different medications  [phone]

## 2013-09-16 NOTE — Telephone Encounter (Signed)
rtn call to pt- pt needs refill on meloxicam and gabapentin. Lm to advise pt she needs to RTC she has not been evaluated since 05/2012.

## 2013-09-17 DIAGNOSIS — R05 Cough: Secondary | ICD-10-CM | POA: Insufficient documentation

## 2013-09-17 DIAGNOSIS — R058 Other specified cough: Secondary | ICD-10-CM | POA: Insufficient documentation

## 2013-09-17 NOTE — Assessment & Plan Note (Addendum)
A good rule of thumb is that if she didn't need prednisone chronically at dx then she doesn't need it now  Strongly doubt active sarcoid but will f/u after address the upper airway cough

## 2013-09-17 NOTE — Assessment & Plan Note (Addendum)
The most common causes of chronic cough in immunocompetent adults include the following: upper airway cough syndrome (UACS), previously referred to as postnasal drip syndrome (PNDS), which is caused by variety of rhinosinus conditions; (2) asthma; (3) GERD; (4) chronic bronchitis from cigarette smoking or other inhaled environmental irritants; (5) nonasthmatic eosinophilic bronchitis; and (6) bronchiectasis.   These conditions, singly or in combination, have accounted for up to 94% of the causes of chronic cough in prospective studies.   Other conditions have constituted no >6% of the causes in prospective studies These have included bronchogenic carcinoma, chronic interstitial pneumonia, sarcoidosis, left ventricular failure, ACEI-induced cough, and aspiration from a condition associated with pharyngeal dysfunction.    Chronic cough is often simultaneously caused by more than one condition. A single cause has been found from 38 to 82% of the time, multiple causes from 18 to 62%. Multiply caused cough has been the result of three diseases up to 42% of the time.       Based on hx and exam, this is most likely:  Classic Upper airway cough syndrome, so named because it's frequently impossible to sort out how much is  CR/sinusitis with freq throat clearing (which can be related to primary GERD)   vs  causing  secondary (" extra esophageal")  GERD from wide swings in gastric pressure that occur with throat clearing, often  promoting self use of mint and menthol lozenges that reduce the lower esophageal sphincter tone and exacerbate the problem further in a cyclical fashion.   These are the same pts (now being labeled as having "irritable larynx syndrome" by some cough centers) who not infrequently have a history of having failed to tolerate ace inhibitors,  dry powder inhalers or biphosphonates or report having atypical reflux symptoms that don't respond to standard doses of PPI , and are easily confused as  having aecopd or asthma flares by even experienced allergists/ pulmonologists.   The first step is to maximize acid suppression and eliminate acei then regroup in 4 weeks  See instructions for specific recommendations which were reviewed directly with the patient who was given a copy with highlighter outlining the key components.

## 2013-09-17 NOTE — Assessment & Plan Note (Signed)

## 2013-09-18 ENCOUNTER — Institutional Professional Consult (permissible substitution): Payer: Medicare Other | Admitting: Neurology

## 2013-09-25 ENCOUNTER — Ambulatory Visit (INDEPENDENT_AMBULATORY_CARE_PROVIDER_SITE_OTHER): Payer: Medicare Other | Admitting: Neurology

## 2013-09-25 ENCOUNTER — Encounter: Payer: Self-pay | Admitting: Neurology

## 2013-09-25 VITALS — BP 116/79 | HR 99 | Resp 18 | Ht 66.75 in | Wt 334.0 lb

## 2013-09-25 DIAGNOSIS — G4733 Obstructive sleep apnea (adult) (pediatric): Secondary | ICD-10-CM

## 2013-09-25 DIAGNOSIS — R0609 Other forms of dyspnea: Secondary | ICD-10-CM

## 2013-09-25 DIAGNOSIS — R0989 Other specified symptoms and signs involving the circulatory and respiratory systems: Secondary | ICD-10-CM

## 2013-09-25 DIAGNOSIS — R0683 Snoring: Secondary | ICD-10-CM

## 2013-09-25 DIAGNOSIS — J45901 Unspecified asthma with (acute) exacerbation: Secondary | ICD-10-CM

## 2013-09-25 DIAGNOSIS — E662 Morbid (severe) obesity with alveolar hypoventilation: Secondary | ICD-10-CM

## 2013-09-25 DIAGNOSIS — R0902 Hypoxemia: Secondary | ICD-10-CM

## 2013-09-25 HISTORY — DX: Hypoxemia: R09.02

## 2013-09-25 MED ORDER — ZOLPIDEM TARTRATE ER 12.5 MG PO TBCR: EXTENDED_RELEASE_TABLET | ORAL | Status: DC

## 2013-09-25 NOTE — Addendum Note (Signed)
Addended by: Larey Seat on: 09/25/2013 09:50 AM   Modules accepted: Orders

## 2013-09-25 NOTE — Patient Instructions (Signed)
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
## 2013-10-02 ENCOUNTER — Encounter: Payer: Self-pay | Admitting: *Deleted

## 2013-10-21 ENCOUNTER — Ambulatory Visit (INDEPENDENT_AMBULATORY_CARE_PROVIDER_SITE_OTHER): Payer: Medicare Other | Admitting: Neurology

## 2013-10-21 ENCOUNTER — Telehealth: Payer: Self-pay | Admitting: *Deleted

## 2013-10-21 DIAGNOSIS — E662 Morbid (severe) obesity with alveolar hypoventilation: Secondary | ICD-10-CM

## 2013-10-21 DIAGNOSIS — R0902 Hypoxemia: Secondary | ICD-10-CM

## 2013-10-21 DIAGNOSIS — J45901 Unspecified asthma with (acute) exacerbation: Secondary | ICD-10-CM

## 2013-10-21 DIAGNOSIS — G4733 Obstructive sleep apnea (adult) (pediatric): Secondary | ICD-10-CM

## 2013-10-21 DIAGNOSIS — R0683 Snoring: Secondary | ICD-10-CM

## 2013-10-21 NOTE — Telephone Encounter (Signed)
generic outgoing voicemail message-did not leave message

## 2013-10-28 ENCOUNTER — Telehealth: Payer: Self-pay

## 2013-10-28 NOTE — Telephone Encounter (Signed)
Clld pt - Dickens on cell to schedule Diabetic Care Follow up appt.

## 2013-11-02 ENCOUNTER — Telehealth: Payer: Self-pay | Admitting: *Deleted

## 2013-11-02 NOTE — Telephone Encounter (Signed)
Patient called and left VM to contact office to go over the results of her Split night sleep study.

## 2013-11-04 ENCOUNTER — Telehealth: Payer: Self-pay

## 2013-11-04 NOTE — Telephone Encounter (Signed)
Christine Parrish,  Patient called office and stated you had tried to reach her several times with no success. She asked for me to let you know that her phone is messed up and she is unable to hear it ring. She stated she is very sorry and if you could call her back at this number [phone] with her sleep results.  Thanks

## 2013-11-10 ENCOUNTER — Telehealth: Payer: Self-pay | Admitting: *Deleted

## 2013-11-10 ENCOUNTER — Other Ambulatory Visit: Payer: Self-pay | Admitting: Neurology

## 2013-11-10 ENCOUNTER — Encounter: Payer: Self-pay | Admitting: *Deleted

## 2013-11-10 DIAGNOSIS — G4733 Obstructive sleep apnea (adult) (pediatric): Secondary | ICD-10-CM

## 2013-11-10 DIAGNOSIS — E662 Morbid (severe) obesity with alveolar hypoventilation: Secondary | ICD-10-CM

## 2013-11-10 NOTE — Telephone Encounter (Signed)
Was finally able to establish contact with patient and provide the results of her split night study that revealed severe obstructive sleep apnea and obesity hypoventilation syndrome.  Patient was informed that treatment with BIPAP as well as supplemental 02 was recommended and she was referred to [facility] for BIPAP set up.  A copy of the results were mailed to the patient and a copy was faxed to Dr. Steffanie Dunn Smith/ Dr. Silvestre Mesi.

## 2013-11-27 ENCOUNTER — Telehealth: Payer: Self-pay | Admitting: *Deleted

## 2013-11-27 NOTE — Telephone Encounter (Signed)
[facility] contacted our office and stated that patient was not going to procure her recommended BIPAP machine due to the cost.

## 2013-11-30 NOTE — Telephone Encounter (Signed)
Christine Parrish. [place] left some paperwork and contacts here in September addressing the PAP therapy need of underinsured or uninsured patients.  Kissa got a copy of it , too. There is a way - MD needs to order and set machine and will be the " compliance agent" . Please ask her about it , c. d.

## 2013-12-01 ENCOUNTER — Telehealth: Payer: Self-pay | Admitting: *Deleted

## 2013-12-01 NOTE — Telephone Encounter (Signed)
This patient does not qualify for the program that assists with patients who can't afford CPAP  through [place].   She has insurance.  That program is for people w/o insurance.

## 2013-12-02 ENCOUNTER — Telehealth: Payer: Self-pay

## 2013-12-02 NOTE — Telephone Encounter (Signed)
Jorie from [facility] called to see if we've received a fax from them for the patient. States pt needs diabetic supplies.

## 2013-12-04 NOTE — Telephone Encounter (Signed)
John from [employer] called on behalf of pt. States that they received a Doctor order form missing diagnostic codes that will be faxed over. When this is corrected please fax back to [phone].

## 2013-12-08 NOTE — Telephone Encounter (Signed)
Completed Dx code on order and faxed back.

## 2013-12-31 ENCOUNTER — Telehealth: Payer: Self-pay

## 2013-12-31 NOTE — Telephone Encounter (Signed)
Spoke to patient. She has not had her flu shot yet, but will get it soon.

## 2014-01-13 ENCOUNTER — Telehealth: Payer: Self-pay | Admitting: *Deleted

## 2014-01-13 DIAGNOSIS — IMO0002 Reserved for concepts with insufficient information to code with codable children: Secondary | ICD-10-CM

## 2014-01-13 DIAGNOSIS — E1165 Type 2 diabetes mellitus with hyperglycemia: Secondary | ICD-10-CM

## 2014-01-13 DIAGNOSIS — E118 Type 2 diabetes mellitus with unspecified complications: Principal | ICD-10-CM

## 2014-01-13 NOTE — Telephone Encounter (Signed)
Phoned patient requesting to schedule lab only visit for next week.  Patient stated she had planned to come for her flu vacc anyway.  She is scheduled for her HbA1C, Lipid/LDL-C, CMP/Nephro screening and states she didn't have an opthamologist (when asked about diabetic eye exam) and requested referral to one close to her...  Orders placed for Dr. Zannie Cove to sign (she was put on Tor Netters, FNP schedule for Tuesday 12/29 due to Dr. Thompson Caul schedule being blocked.

## 2014-01-19 ENCOUNTER — Encounter: Payer: Self-pay | Admitting: Family Medicine

## 2014-01-19 ENCOUNTER — Other Ambulatory Visit (INDEPENDENT_AMBULATORY_CARE_PROVIDER_SITE_OTHER): Payer: Medicare Other | Admitting: Family Medicine

## 2014-01-19 ENCOUNTER — Other Ambulatory Visit: Payer: Self-pay | Admitting: Family Medicine

## 2014-01-19 ENCOUNTER — Ambulatory Visit (INDEPENDENT_AMBULATORY_CARE_PROVIDER_SITE_OTHER): Payer: Medicare Other | Admitting: Family Medicine

## 2014-01-19 ENCOUNTER — Telehealth: Payer: Self-pay | Admitting: Radiology

## 2014-01-19 VITALS — BP 126/84 | HR 100 | Temp 98.1°F | Resp 18 | Ht 66.0 in | Wt 324.0 lb

## 2014-01-19 VITALS — BP 139/83 | HR 94 | Temp 98.0°F | Resp 22 | Ht 66.5 in | Wt 326.0 lb

## 2014-01-19 DIAGNOSIS — N3946 Mixed incontinence: Secondary | ICD-10-CM

## 2014-01-19 DIAGNOSIS — M545 Low back pain, unspecified: Secondary | ICD-10-CM

## 2014-01-19 DIAGNOSIS — Z1389 Encounter for screening for other disorder: Secondary | ICD-10-CM

## 2014-01-19 DIAGNOSIS — T63481A Toxic effect of venom of other arthropod, accidental (unintentional), initial encounter: Secondary | ICD-10-CM

## 2014-01-19 DIAGNOSIS — E8809 Other disorders of plasma-protein metabolism, not elsewhere classified: Secondary | ICD-10-CM

## 2014-01-19 DIAGNOSIS — R05 Cough: Secondary | ICD-10-CM

## 2014-01-19 DIAGNOSIS — R059 Cough, unspecified: Secondary | ICD-10-CM

## 2014-01-19 DIAGNOSIS — I10 Essential (primary) hypertension: Secondary | ICD-10-CM

## 2014-01-19 DIAGNOSIS — E785 Hyperlipidemia, unspecified: Secondary | ICD-10-CM

## 2014-01-19 DIAGNOSIS — Z23 Encounter for immunization: Secondary | ICD-10-CM

## 2014-01-19 DIAGNOSIS — E119 Type 2 diabetes mellitus without complications: Secondary | ICD-10-CM

## 2014-01-19 DIAGNOSIS — R779 Abnormality of plasma protein, unspecified: Secondary | ICD-10-CM

## 2014-01-19 LAB — LIPID PANEL
CHOL/HDL RATIO: 3.5 ratio
Cholesterol: 178 mg/dL (ref 0–200)
HDL: 51 mg/dL (ref >39)
LDL Cholesterol: 107 mg/dL — ABNORMAL HIGH (ref 0–99)
Triglycerides: 102 mg/dL (ref <150)
VLDL: 20 mg/dL (ref 0–40)

## 2014-01-19 LAB — COMPLETE METABOLIC PANEL WITH GFR
ALK PHOS: 81 U/L (ref 39–117)
ALT: 10 U/L (ref 0–35)
AST: 13 U/L (ref 0–37)
Albumin: 3.9 g/dL (ref 3.5–5.2)
BUN: 17 mg/dL (ref 6–23)
CO2: 30 mEq/L (ref 19–32)
Calcium: 9.5 mg/dL (ref 8.4–10.5)
Chloride: 100 mEq/L (ref 96–112)
Creat: 1.24 mg/dL — ABNORMAL HIGH (ref 0.50–1.10)
GFR, EST AFRICAN AMERICAN: 52 mL/min — AB
GFR, EST NON AFRICAN AMERICAN: 45 mL/min — AB
Glucose, Bld: 127 mg/dL — ABNORMAL HIGH (ref 70–99)
POTASSIUM: 3.5 meq/L (ref 3.5–5.3)
Sodium: 139 mEq/L (ref 135–145)
Total Bilirubin: 0.4 mg/dL (ref 0.2–1.2)
Total Protein: 7.8 g/dL (ref 6.0–8.3)

## 2014-01-19 LAB — CBC
HCT: 42.4 % (ref 36.0–46.0)
Hemoglobin: 14 g/dL (ref 12.0–15.0)
MCH: 28.1 pg (ref 26.0–34.0)
MCHC: 33 g/dL (ref 30.0–36.0)
MCV: 85 fL (ref 78.0–100.0)
MPV: 11.6 fL (ref 8.6–12.4)
Platelets: 288 10*3/uL (ref 150–400)
RBC: 4.99 MIL/uL (ref 3.87–5.11)
RDW: 13.5 % (ref 11.5–15.5)
WBC: 9.7 10*3/uL (ref 4.0–10.5)

## 2014-01-19 MED ORDER — HYDROCHLOROTHIAZIDE 25 MG PO TABS: 25.0000 mg | ORAL_TABLET | Freq: Every day | ORAL | Status: DC

## 2014-01-19 MED ORDER — VALSARTAN 80 MG PO TABS: 80.0000 mg | ORAL_TABLET | Freq: Every day | ORAL | Status: DC

## 2014-01-19 MED ORDER — IPRATROPIUM BROMIDE 0.02 % IN SOLN
0.5000 mg | Freq: Once | RESPIRATORY_TRACT | Status: AC
  Administered 2014-01-19: 0.5 mg via RESPIRATORY_TRACT

## 2014-01-19 MED ORDER — PREDNISONE 10 MG PO TABS: ORAL_TABLET | ORAL | Status: DC

## 2014-01-19 MED ORDER — OMEPRAZOLE 40 MG PO CPDR: 40.0000 mg | DELAYED_RELEASE_CAPSULE | ORAL | Status: DC

## 2014-01-19 MED ORDER — DIPHENHYDRAMINE HCL 2 % EX CREA: TOPICAL_CREAM | Freq: Three times a day (TID) | CUTANEOUS | Status: DC | PRN

## 2014-01-19 MED ORDER — HYDROXYZINE HCL 25 MG PO TABS: 25.0000 mg | ORAL_TABLET | ORAL | Status: DC | PRN

## 2014-01-19 MED ORDER — ALBUTEROL SULFATE (2.5 MG/3ML) 0.083% IN NEBU
2.5000 mg | INHALATION_SOLUTION | Freq: Once | RESPIRATORY_TRACT | Status: AC
  Administered 2014-01-19: 2.5 mg via RESPIRATORY_TRACT

## 2014-01-19 NOTE — Telephone Encounter (Signed)
Patient came in today for lab only. Christine Parrish has ordered these for her to close caps in her care. It appears patient needs to follow up here, and have a physical. She has been advised to schedule this, to you [facility]

## 2014-01-19 NOTE — Progress Notes (Addendum)
Subjective:    Patient ID: Christine Parrish, female    DOB: 1945/11/03, 68 y.o.   MRN: [id number]  This chart was scribed for Christine Knapp, MD, by Stephania Fragmin, ED Scribe. This patient was seen in room 2 and the patient's care was started at 12:52 PM.   Chief Complaint  Patient presents with  . rx refills    hctz, diovan would like 42mh supply   . Rash    body 3-4 days very itchy    HPI  HPI Comments: CADALIND WEITZis a 68y.o. female who presents to the [facility] complaining of rash and medication refill.   Dr. STamala Julianis pt's PCP. She was informed by DHinton Dyer the Medicare nurse, that she needed to come in for routine lab-work and a flu vaccine, which she was scheduled for today and has been drawn. Her last hemoglobin A1c was elevated at 8.4 taken 7 months ago. A CMP done 7 months ago showed hypokalemia and chronic renal failure. Concern about elevated protein with low albumin. Does not appear pt has been tested for multiple myeloma or seen nephrology. Hx of sarcoidosis and follows closely with cardiology and pulmonology. Diagnosed with sleep apnea, and obesity hypoventilation syndrome, put on CPAP and has problems of chronic cough with hx of pneumonia, as well as asthma and sarcoidosis. Labs in the ED showed acute renal failure. Last CMP was in setting of abdominal pain, diarrhea, and vomiting. Seen by Dr. WMelvyn Novasin pulmonology in August. Diagnosed with sarcoid in 1987, with chronic non-productive cough. Has a history of smoking, though she quit in 2005. Dr. WMelvyn Novasstrongly doubted she had active sarcoid and so no need for chronic Prednisone. Recommended treatment for upper airway cough by ensuring GERD very well-controlled. Pt has gone off Priolosec and Pepcid that he started.    Patient complains of a rash on her upper back that began about 3 days ago after patient's A/C stopped working, and she had to open her patio for 2 days, which she normally doesn't do. Her house has been  very hot and humid. She saw what looked like mosquito bumps on left forearm, thighs, feet, and ankles, which she has treated with anti-itch cream and oral Benadryl. She also reports she scratches them with a hair brush for itch relief. She denies wearing tight clothing, pets, and any spots on her genitals or between her toes.  Patient is also here for a Diovan and Hydrodiuril refill. She states that regularly checks blood pressure her blood pressure, which is around 100.   In regards to her DM, she states that she eats a lot of meat, and regularly has bananas and sweet potatoes. When patient occasionally becomes sweaty and nauseous from low blood sugar, she eats a snack.   Patient has tried cough syrup and Benadryl allergy for her dry cough, which alleviates it. She denies current smoking.  Patient complains of bilateral lower back pain, which improves significantly with Tylenol and has responded well to Salonpas patches. Her daughter works for [employer] so she can obtain OTC medication, such as those patches, for free, with a prescription. She has also been using tampons on the new Depends intrabladder support for her stress incontinence, which has worked well, but it has been expensive to use these disposable products as frequently as needed.     Past Medical History  Diagnosis Date  . Arthritis   . Asthma   . Hypertension   .  Cancer     Cervical  . Sleep apnea     No CPAP (broken)  . DM (diabetes mellitus)   . Hypoxemia 09/25/2013   Current Outpatient Prescriptions on File Prior to Visit  Medication Sig Dispense Refill  . Blood Glucose Monitoring Suppl (BLOOD GLUCOSE METER) kit Test blood sugar daily. Dx code: 250.92 1 each 0  . glucose blood test strip Test blood sugar daily. Dx code: 250.92. 100 each 3  . glucose blood test strip Use as instructed 100 each 12  . glucose monitoring kit (FREESTYLE) monitoring kit 1 each by Does not apply route as needed for other. 1 each 0    . hydrochlorothiazide (HYDRODIURIL) 25 MG tablet Take 1 tablet (25 mg total) by mouth daily. 90 tablet 3  . Lancets (ACCU-CHEK MULTICLIX) lancets Use as instructed 100 each 12  . Lancets (ONETOUCH ULTRASOFT) lancets Test blood sugar daily. Dx code: 250.92 100 each 3  . valsartan (DIOVAN) 80 MG tablet Take 1 tablet (80 mg total) by mouth daily. 30 tablet 11  . famotidine (PEPCID) 20 MG tablet Take 20 mg by mouth at bedtime.    Marland Kitchen omeprazole (PRILOSEC) 20 MG capsule Take 20 mg by mouth every morning.    . zolpidem (AMBIEN CR) 12.5 MG CR tablet For use during the sleep study. Take at the lab. (Patient not taking: Reported on 01/19/2014) 15 tablet 0   No current facility-administered medications on file prior to visit.   Allergies  Allergen Reactions  . Codeine Phosphate Nausea And Vomiting     Review of Systems  Constitutional: Negative for diaphoresis, fatigue and unexpected weight change.  Respiratory: Positive for cough.   Gastrointestinal: Negative for nausea.  Skin: Positive for color change and rash.  Neurological: Negative for dizziness, weakness, light-headedness and numbness.  Hematological: Does not bruise/bleed easily.       Objective:  BP 126/84 mmHg  Pulse 100  Temp(Src) 98.1 F (36.7 C) (Oral)  Resp 18  Ht 5' 6"  (1.676 m)  Wt 324 lb (146.965 kg)  BMI 52.32 kg/m2  SpO2 96%  Physical Exam  Constitutional: She is oriented to person, place, and time. She appears well-developed and well-nourished. No distress.  HENT:  Head: Normocephalic and atraumatic.  Right Ear: A middle ear effusion is present.  Left Ear: Tympanic membrane normal.  Nose: Mucosal edema (pale, boggy) present.  Eyes: Conjunctivae and EOM are normal.  Neck: Neck supple. No tracheal deviation present.  Cardiovascular: Normal rate, regular rhythm and normal heart sounds.  Exam reveals no friction rub.   No murmur heard. Pulmonary/Chest: Effort normal. No respiratory distress.  Coarsened  inspiratory and expiratory breath sounds throughout.  Musculoskeletal: Normal range of motion.  Neurological: She is alert and oriented to person, place, and time.  Skin: Skin is warm and dry. There is erythema.  On upper back - erythematous macules, poorly defined, blanching.  Psychiatric: She has a normal mood and affect. Her behavior is normal.  Nursing note and vitals reviewed.         Assessment & Plan:   1:15 PM-Discussed treatment plan with pt and pt agreed to plan.    2:20 PM - Pt told to use Tylenol and Salonpas pads, NOT to use OTC NSAIDs due to mild chronic renal failure. Happy to write prescription as needed.   Elevated blood protein - with last labs 5 mos prior pt had a mildly elev prot with suppressed albumin so check SPEP today - SPEP looks benign but  is inconclusive - luckily prot and alb levels have returned to normal on labs today but do consider checking serum IFE at f/u as more sensitive to developing problems.  Hypoalbuminemia  Essential hypertension - Plan: hydrochlorothiazide (HYDRODIURIL) 25 MG tablet  Allergic reaction to insect sting, accidental or unintentional, initial encounter - prednisone should help in addition to prn visteril and topical benadryl to treat itching for insect bites.  Pt advised to check furniture for bed bugs and if she develops any rash in intertriginous areas to return to clinic to evaluate for scabies   Cough - Plan: albuterol (PROVENTIL) (2.5 MG/3ML) 0.083% nebulizer solution 2.5 mg, ipratropium (ATROVENT) nebulizer solution 0.5 mg - Informed to restart on omeprazole as pulmonology suspected GERD was cause of chronic cough. But will also cover with short low-dose taper of prednisone, limited due to diabetes; however, CBGs have been well controlled, and combined with her history of sarcoid, complaints of acute illness, and allergic rxn to unknown insects, this will serve multiple purposes. Advised to contact insurance, to find out which  PPI is cheapest.   Mixed stress and urge urinary incontinence - Plan: Ambulatory referral to Gynecology - Advised to consider seeing a gynecologist to discuss fitting and use of a pessary since urinary incontinence medications not working.    Bilateral low back pain without sciatica  Meds ordered this encounter  Medications  . valsartan (DIOVAN) 80 MG tablet    Sig: Take 1 tablet (80 mg total) by mouth daily.    Dispense:  90 tablet    Refill:  3  . hydrochlorothiazide (HYDRODIURIL) 25 MG tablet    Sig: Take 1 tablet (25 mg total) by mouth daily.    Dispense:  90 tablet    Refill:  3  . predniSONE (DELTASONE) 10 MG tablet    Sig: 4 tabs x 2d, 2 tabs x 2d, 1 tab x 2d    Dispense:  14 tablet    Refill:  0  . hydrOXYzine (ATARAX/VISTARIL) 25 MG tablet    Sig: Take 1 tablet (25 mg total) by mouth every 4 (four) hours as needed for itching.    Dispense:  30 tablet    Refill:  0  . diphenhydrAMINE (BENADRYL) 2 % cream    Sig: Apply topically 3 (three) times daily as needed for itching.    Dispense:  56.8 g    Refill:  0  . omeprazole (PRILOSEC) 40 MG capsule    Sig: Take 1 capsule (40 mg total) by mouth every morning.    Dispense:  30 capsule    Refill:  3  . albuterol (PROVENTIL) (2.5 MG/3ML) 0.083% nebulizer solution 2.5 mg    Sig:   . ipratropium (ATROVENT) nebulizer solution 0.5 mg    Sig:     I personally performed the services described in this documentation, which was scribed in my presence. The recorded information has been reviewed and considered, and addended by me as needed.  Delman Cheadle, MD MPH   Over 40 min spent in face-to-face evaluation of and consultation with patient and coordination of care.  Results for orders placed or performed in visit on 01/19/14  Hemoglobin A1c  Result Value Ref Range   Hgb A1c MFr Bld 6.4 (H) <5.7 %   Mean Plasma Glucose 137 (H) <117 mg/dL  Lipid panel  Result Value Ref Range   Cholesterol 178 0 - 200 mg/dL   Triglycerides 102  <150 mg/dL   HDL 51 >39 mg/dL   Total  CHOL/HDL Ratio 3.5 Ratio   VLDL 20 0 - 40 mg/dL   LDL Cholesterol 107 (H) 0 - 99 mg/dL  COMPLETE METABOLIC PANEL WITH GFR  Result Value Ref Range   Sodium 139 135 - 145 mEq/L   Potassium 3.5 3.5 - 5.3 mEq/L   Chloride 100 96 - 112 mEq/L   CO2 30 19 - 32 mEq/L   Glucose, Bld 127 (H) 70 - 99 mg/dL   BUN 17 6 - 23 mg/dL   Creat 1.24 (H) 0.50 - 1.10 mg/dL   Total Bilirubin 0.4 0.2 - 1.2 mg/dL   Alkaline Phosphatase 81 39 - 117 U/L   AST 13 0 - 37 U/L   ALT 10 0 - 35 U/L   Total Protein 7.8 6.0 - 8.3 g/dL   Albumin 3.9 3.5 - 5.2 g/dL   Calcium 9.5 8.4 - 10.5 mg/dL   GFR, Est African American 52 (L) mL/min   GFR, Est Non African American 45 (L) mL/min

## 2014-01-19 NOTE — Patient Instructions (Addendum)
Lets see if we can get you to [facility] to get a pessary fitted for your urinary stress and urge mixed incontinence.   Food Choices for Gastroesophageal Reflux Disease When you have gastroesophageal reflux disease (GERD), the foods you eat and your eating habits are very important. Choosing the right foods can help ease the discomfort of GERD. WHAT GENERAL GUIDELINES DO I NEED TO FOLLOW?  Choose fruits, vegetables, whole grains, low-fat dairy products, and low-fat meat, fish, and poultry.  Limit fats such as oils, salad dressings, butter, nuts, and avocado.  Keep a food diary to identify foods that cause symptoms.  Avoid foods that cause reflux. These may be different for different people.  Eat frequent small meals instead of three large meals each day.  Eat your meals slowly, in a relaxed setting.  Limit fried foods.  Cook foods using methods other than frying.  Avoid drinking alcohol.  Avoid drinking large amounts of liquids with your meals.  Avoid bending over or lying down until 2-3 hours after eating. WHAT FOODS ARE NOT RECOMMENDED? The following are some foods and drinks that may worsen your symptoms: Vegetables Tomatoes. Tomato juice. Tomato and spaghetti sauce. Chili peppers. Onion and garlic. Horseradish. Fruits Oranges, grapefruit, and lemon (fruit and juice). Meats High-fat meats, fish, and poultry. This includes hot dogs, ribs, ham, sausage, salami, and bacon. Dairy Whole milk and chocolate milk. Sour cream. Cream. Butter. Ice cream. Cream cheese.  Beverages Coffee and tea, with or without caffeine. Carbonated beverages or energy drinks. Condiments Hot sauce. Barbecue sauce.  Sweets/Desserts Chocolate and cocoa. Donuts. Peppermint and spearmint. Fats and Oils High-fat foods, including [place] fries and potato chips. Other Vinegar. Strong spices, such as black pepper, white pepper, red pepper, cayenne, curry powder, cloves, ginger, and  chili powder. The items listed above may not be a complete list of foods and beverages to avoid. Contact your dietitian for more information. Document Released: 01/08/2005 Document Revised: 01/13/2013 Document Reviewed: 11/12/2012 [facility] Patient Information 2015 [place], [place]. This information is not intended to replace advice given to you by your health care provider. Make sure you discuss any questions you have with your health care provider.   Gastroesophageal Reflux Disease, Adult Gastroesophageal reflux disease (GERD) happens when acid from your stomach flows up into the esophagus. When acid comes in contact with the esophagus, the acid causes soreness (inflammation) in the esophagus. Over time, GERD may create small holes (ulcers) in the lining of the esophagus. CAUSES   Increased body weight. This puts pressure on the stomach, making acid rise from the stomach into the esophagus.  Smoking. This increases acid production in the stomach.  Drinking alcohol. This causes decreased pressure in the lower esophageal sphincter (valve or ring of muscle between the esophagus and stomach), allowing acid from the stomach into the esophagus.  Late evening meals and a full stomach. This increases pressure and acid production in the stomach.  A malformed lower esophageal sphincter. Sometimes, no cause is found. SYMPTOMS   Burning pain in the lower part of the mid-chest behind the breastbone and in the mid-stomach area. This may occur twice a week or more often.  Trouble swallowing.  Sore throat.  Dry cough.  Asthma-like symptoms including chest tightness, shortness of breath, or wheezing. DIAGNOSIS  Your caregiver may be able to diagnose GERD based on your symptoms. In some cases, X-rays and other tests may be done to check for complications or to check the condition of your stomach and esophagus.  TREATMENT  Your caregiver may recommend over-the-counter or prescription medicines to help  decrease acid production. Ask your caregiver before starting or adding any new medicines.  HOME CARE INSTRUCTIONS   Change the factors that you can control. Ask your caregiver for guidance concerning weight loss, quitting smoking, and alcohol consumption.  Avoid foods and drinks that make your symptoms worse, such as:  Caffeine or alcoholic drinks.  Chocolate.  Peppermint or mint flavorings.  Garlic and onions.  Spicy foods.  Citrus fruits, such as oranges, lemons, or limes.  Tomato-based foods such as sauce, chili, salsa, and pizza.  Fried and fatty foods.  Avoid lying down for the 3 hours prior to your bedtime or prior to taking a nap.  Eat small, frequent meals instead of large meals.  Wear loose-fitting clothing. Do not wear anything tight around your waist that causes pressure on your stomach.  Raise the head of your bed 6 to 8 inches with wood blocks to help you sleep. Extra pillows will not help.  Only take over-the-counter or prescription medicines for pain, discomfort, or fever as directed by your caregiver.  Do not take aspirin, ibuprofen, or other nonsteroidal anti-inflammatory drugs (NSAIDs). SEEK IMMEDIATE MEDICAL CARE IF:   You have pain in your arms, neck, jaw, teeth, or back.  Your pain increases or changes in intensity or duration.  You develop nausea, vomiting, or sweating (diaphoresis).  You develop shortness of breath, or you faint.  Your vomit is green, yellow, black, or looks like coffee grounds or blood.  Your stool is red, bloody, or black. These symptoms could be signs of other problems, such as heart disease, gastric bleeding, or esophageal bleeding. MAKE SURE YOU:   Understand these instructions.  Will watch your condition.  Will get help right away if you are not doing well or get worse. Document Released: 10/18/2004 Document Revised: 04/02/2011 Document Reviewed: 07/28/2010 [facility] Patient Information 2015 [place], [place]. This  information is not intended to replace advice given to you by your health care provider. Make sure you discuss any questions you have with your health care provider.

## 2014-01-19 NOTE — Progress Notes (Signed)
Pt here for lab draw only  

## 2014-01-21 LAB — PROTEIN ELECTROPHORESIS, SERUM
ALBUMIN ELP: 45.9 % — AB (ref 55.8–66.1)
ALPHA-2-GLOBULIN: 14.1 % — AB (ref 7.1–11.8)
Alpha-1-Globulin: 5.1 % — ABNORMAL HIGH (ref 2.9–4.9)
BETA 2: 7 % — AB (ref 3.2–6.5)
Beta Globulin: 6 % (ref 4.7–7.2)
Gamma Globulin: 21.9 % — ABNORMAL HIGH (ref 11.1–18.8)
Total Protein, Serum Electrophoresis: 7.9 g/dL (ref 6.0–8.3)

## 2014-01-22 LAB — HEMOGLOBIN A1C
HEMOGLOBIN A1C: 6.4 % — AB (ref <5.7)
MEAN PLASMA GLUCOSE: 137 mg/dL — AB (ref <117)

## 2014-01-31 ENCOUNTER — Encounter: Payer: Self-pay | Admitting: Family Medicine

## 2014-03-03 LAB — HM DIABETES EYE EXAM

## 2014-03-05 ENCOUNTER — Encounter: Payer: Self-pay | Admitting: *Deleted

## 2014-04-19 ENCOUNTER — Encounter: Payer: Self-pay | Admitting: Family Medicine

## 2014-04-28 ENCOUNTER — Ambulatory Visit: Payer: Self-pay | Admitting: Family Medicine

## 2014-06-27 ENCOUNTER — Ambulatory Visit (INDEPENDENT_AMBULATORY_CARE_PROVIDER_SITE_OTHER): Payer: Medicare Other | Admitting: Internal Medicine

## 2014-06-27 VITALS — BP 128/86 | HR 105 | Temp 98.1°F | Resp 18 | Ht 66.0 in | Wt 328.0 lb

## 2014-06-27 DIAGNOSIS — R8281 Pyuria: Secondary | ICD-10-CM

## 2014-06-27 DIAGNOSIS — R309 Painful micturition, unspecified: Secondary | ICD-10-CM

## 2014-06-27 DIAGNOSIS — N390 Urinary tract infection, site not specified: Secondary | ICD-10-CM

## 2014-06-27 DIAGNOSIS — E66813 Obesity, class 3: Secondary | ICD-10-CM

## 2014-06-27 DIAGNOSIS — E118 Type 2 diabetes mellitus with unspecified complications: Secondary | ICD-10-CM

## 2014-06-27 LAB — POCT URINALYSIS DIPSTICK
Bilirubin, UA: NEGATIVE
Glucose, UA: NEGATIVE
Ketones, UA: NEGATIVE
NITRITE UA: NEGATIVE
Protein, UA: >=300
SPEC GRAV UA: 1.025
Urobilinogen, UA: 0.2
pH, UA: 5

## 2014-06-27 LAB — COMPREHENSIVE METABOLIC PANEL
ALT: 15 U/L (ref 0–35)
AST: 16 U/L (ref 0–37)
Albumin: 3.7 g/dL (ref 3.5–5.2)
Alkaline Phosphatase: 73 U/L (ref 39–117)
BUN: 31 mg/dL — ABNORMAL HIGH (ref 6–23)
CO2: 28 meq/L (ref 19–32)
CREATININE: 1.6 mg/dL — AB (ref 0.50–1.10)
Calcium: 9.3 mg/dL (ref 8.4–10.5)
Chloride: 101 mEq/L (ref 96–112)
GLUCOSE: 105 mg/dL — AB (ref 70–99)
POTASSIUM: 4 meq/L (ref 3.5–5.3)
Sodium: 137 mEq/L (ref 135–145)
TOTAL PROTEIN: 7.3 g/dL (ref 6.0–8.3)
Total Bilirubin: 0.4 mg/dL (ref 0.2–1.2)

## 2014-06-27 LAB — POCT UA - MICROSCOPIC ONLY
CRYSTALS, UR, HPF, POC: NEGATIVE
Casts, Ur, LPF, POC: NEGATIVE
Epithelial cells, urine per micros: UNDETERMINED
MUCUS UA: NEGATIVE
Yeast, UA: NEGATIVE

## 2014-06-27 MED ORDER — CIPROFLOXACIN HCL 500 MG PO TABS: 500.0000 mg | ORAL_TABLET | Freq: Two times a day (BID) | ORAL | Status: DC

## 2014-06-27 NOTE — Progress Notes (Signed)
Subjective:  This chart was scribed for [name], MD by Thea Alken, ED Scribe. This patient was seen in room 12 and the patient's care was started at 2:22 PM.  Patient ID: Christine Parrish, female    DOB: [birth year], 69 y.o.   MRN: [id number]  HPI Chief Complaint  Patient presents with  . Urinary Tract Infection    x4 days, burning sensation, unable to completely empty bladder.    HPI Comments: Christine Parrish is a 69 y.o. female with multiple medical problems and morbid obesity who presents to the [facility] complaining of dysuria onset at least 4 days ago. She reports she has been unable to empty her bladder completely as well as some back pain. Pt states she has been drinking plenty of water. She reports taking some medication but states medication was about 37-53 years old. Pt denies fever.   Pt plans to make an appointment with Dr. Tamala Julian soon for follow-up of her diabetes and other medical problems. She has been no weight by not having enough money to cover her co-pay. She has a history of acute pyelonephritis requiring hospitalization which is why she decided to come in today  It is time at least to check hemoglobin A1c. Metformin was started in May 2015 when her hemoglobin A1c was found to be 8.5 but she is not on any medication at the current time. A1c was 6.4 in December on no medication.   Patient Active Problem List   Diagnosis Date Noted  . OSA (obstructive sleep apnea) 09/25/2013  . Obesity, Class III, BMI 40-49.9 (morbid obesity) 09/25/2013  . Snoring 09/25/2013  . Hypoxemia 09/25/2013  . Upper airway cough syndrome 09/17/2013  . Type II or unspecified type diabetes mellitus with unspecified complication, uncontrolled 06/10/2013  . Severe obesity (BMI >= 40) 06/10/2013  . Cervical neuropathic pain 05/24/2012  . SARCOIDOSIS 12/02/2007  . HYPERTENSION 12/02/2007  . ASTHMA 12/02/2007  . SLEEP APNEA 12/02/2007  . CERVICAL CANCER, HX OF 12/02/2007      Past Surgical History  Procedure Laterality Date  . Eye surgery    . Tubal ligation    . Abdominal hysterectomy     No Known Allergies Prior to Admission medications   Medication Sig Start Date End Date Taking? Authorizing Provider  Blood Glucose Monitoring Suppl (BLOOD GLUCOSE METER) kit Test blood sugar daily. Dx code: 250.92 06/22/13  Yes Heather Elnora Morrison, PA-C  diphenhydrAMINE (BENADRYL) 2 % cream Apply topically 3 (three) times daily as needed for itching. 01/19/14  Yes [name], MD  famotidine (PEPCID) 20 MG tablet Take 20 mg by mouth at bedtime.   Yes Historical Provider, MD  glucose blood test strip Test blood sugar daily. Dx code: 32.92. 06/22/13  Yes Collene Leyden, PA-C  glucose blood test strip Use as instructed 06/22/13  Yes Sheryl Precious Haws, DO  glucose monitoring kit (FREESTYLE) monitoring kit 1 each by Does not apply route as needed for other. 06/22/13  Yes Sheryl Precious Haws, DO  hydrochlorothiazide (HYDRODIURIL) 25 MG tablet Take 1 tablet (25 mg total) by mouth daily. 01/19/14  Yes [name], MD  hydrOXYzine (ATARAX/VISTARIL) 25 MG tablet Take 1 tablet (25 mg total) by mouth every 4 (four) hours as needed for itching. 01/19/14  Yes [name], MD  Lancets (ACCU-CHEK MULTICLIX) lancets Use as instructed 06/22/13  Yes Sheryl Precious Haws, DO  Lancets [facility] ULTRASOFT) lancets Test blood sugar daily. Dx code: 093.81 06/22/13  Yes Collene Leyden, PA-C  omeprazole (PRILOSEC) 40 MG capsule Take 1 capsule (40 mg total) by mouth every morning. 01/19/14  Yes [name], MD  valsartan (DIOVAN) 80 MG tablet Take 1 tablet (80 mg total) by mouth daily. 01/19/14  Yes [name], MD  predniSONE (DELTASONE) 10 MG tablet 4 tabs x 2d, 2 tabs x 2d, 1 tab x 2d Patient not taking: Reported on 06/27/2014 01/19/14   [name], MD   Former smoker Review of Systems  Constitutional: Negative for fever, chills and unexpected weight change.  Eyes: Negative for visual disturbance.  Respiratory:  Negative for shortness of breath.   Cardiovascular: Negative for chest pain and palpitations.  Endocrine: Negative for polydipsia and polyphagia.  Genitourinary: Positive for dysuria and urgency.  Neurological: Negative for headaches.   Objective:   Physical Exam  Constitutional: She is oriented to person, place, and time. She appears well-developed and well-nourished. No distress.  HENT:  Head: Normocephalic and atraumatic.  Eyes: Conjunctivae and EOM are normal. Pupils are equal, round, and reactive to light.  Neck: Neck supple.  Cardiovascular: Normal rate.   Pulmonary/Chest: Effort normal.  Abdominal: There is no tenderness.  No CVA tenderness to percussion  Musculoskeletal: Normal range of motion.  Right leg raise to 90 bilaterally without radicular symptoms  Neurological: She is alert and oriented to person, place, and time. No cranial nerve deficit.  Skin: Skin is warm and dry.  Psychiatric: She has a normal mood and affect. Her behavior is normal.  Nursing note and vitals reviewed.   Filed Vitals:   06/27/14 1320  BP: 128/86  Pulse: 105  Temp: 98.1 F (36.7 C)  TempSrc: Oral  Resp: 18  Height: _0  (1.676 m)  Weight: 328 lb (148.78 kg)   Wt Readings from Last 3 Encounters:  06/27/14 328 lb (148.78 kg)  01/19/14 324 lb (146.965 kg)  01/19/14 326 lb (147.873 kg)     Results for orders placed or performed in visit on 06/27/14  POCT urinalysis dipstick  Result Value Ref Range   Color, UA yellow    Clarity, UA cloudy    Glucose, UA neg    Bilirubin, UA neg    Ketones, UA neg    Spec Grav, UA 1.025    Blood, UA large    pH, UA 5.0    Protein, UA >=300    Urobilinogen, UA 0.2    Nitrite, UA neg    Leukocytes, UA large (3+)   POCT UA - Microscopic Only  Result Value Ref Range   WBC, Ur, HPF, POC TNTC    RBC, urine, microscopic TNTC    Bacteria, U Microscopic TRACE    Mucus, UA NEG    Epithelial cells, urine per micros UNABLE TO SEE    Crystals, Ur,  HPF, POC NEG    Casts, Ur, LPF, POC NEG    Yeast, UA NEG    Assessment & Plan:  Painful urination -with pyuria  -Culture -Start Cipro 500  Type 2 diabetes mellitus with complication  - Plan: Comprehensive metabolic panel, Hemoglobin A1c  Obesity, Class III, BMI 40-49.9 (morbid obesity)  Multiple other medical problems to be followed up by appointment    I have completed the patient encounter in its entirety as documented by the scribe, with editing by me where necessary. Reka Wist P. Laney Pastor, M.D.  Addendum Results for orders placed or performed in visit on 06/27/14  Comprehensive metabolic panel  Result Value Ref Range  Sodium 137 135 - 145 mEq/L   Potassium 4.0 3.5 - 5.3 mEq/L   Chloride 101 96 - 112 mEq/L   CO2 28 19 - 32 mEq/L   Glucose, Bld 105 (H) 70 - 99 mg/dL   BUN 31 (H) 6 - 23 mg/dL   Creat 1.60 (H) 0.50 - 1.10 mg/dL   Total Bilirubin 0.4 0.2 - 1.2 mg/dL   Alkaline Phosphatase 73 39 - 117 U/L   AST 16 0 - 37 U/L   ALT 15 0 - 35 U/L   Total Protein 7.3 6.0 - 8.3 g/dL   Albumin 3.7 3.5 - 5.2 g/dL   Calcium 9.3 8.4 - 10.5 mg/dL  Hemoglobin A1c  Result Value Ref Range   Hgb A1c MFr Bld 6.7 (H) <5.7 %   Mean Plasma Glucose 146 (H) <117 mg/dL   We can continue medications at current doses until her follow-up/no current treatment except diet for diabetes She has had renal failure in the past but this creatinine is stable Potential proteinopathy was not investigated

## 2014-06-28 LAB — HEMOGLOBIN A1C
Hgb A1c MFr Bld: 6.7 % — ABNORMAL HIGH (ref <5.7)
Mean Plasma Glucose: 146 mg/dL — ABNORMAL HIGH (ref <117)

## 2014-06-28 LAB — URINE CULTURE: Colony Count: >=100000

## 2014-06-29 ENCOUNTER — Encounter: Payer: Self-pay | Admitting: Internal Medicine

## 2014-07-20 ENCOUNTER — Ambulatory Visit (INDEPENDENT_AMBULATORY_CARE_PROVIDER_SITE_OTHER): Payer: Medicare Other | Admitting: Family Medicine

## 2014-07-20 VITALS — BP 136/84 | HR 73 | Temp 98.7°F | Resp 17 | Ht 66.0 in | Wt 327.0 lb

## 2014-07-20 DIAGNOSIS — R300 Dysuria: Secondary | ICD-10-CM

## 2014-07-20 DIAGNOSIS — R0902 Hypoxemia: Secondary | ICD-10-CM

## 2014-07-20 DIAGNOSIS — M5412 Radiculopathy, cervical region: Secondary | ICD-10-CM | POA: Diagnosis not present

## 2014-07-20 DIAGNOSIS — N390 Urinary tract infection, site not specified: Secondary | ICD-10-CM | POA: Diagnosis not present

## 2014-07-20 DIAGNOSIS — R319 Hematuria, unspecified: Secondary | ICD-10-CM

## 2014-07-20 DIAGNOSIS — R748 Abnormal levels of other serum enzymes: Secondary | ICD-10-CM

## 2014-07-20 DIAGNOSIS — R7989 Other specified abnormal findings of blood chemistry: Secondary | ICD-10-CM

## 2014-07-20 LAB — POCT UA - MICROSCOPIC ONLY
CASTS, UR, LPF, POC: NEGATIVE
CRYSTALS, UR, HPF, POC: NEGATIVE
Mucus, UA: NEGATIVE
YEAST UA: NEGATIVE

## 2014-07-20 LAB — COMPLETE METABOLIC PANEL WITH GFR
ALT: 10 U/L (ref 0–35)
AST: 14 U/L (ref 0–37)
Albumin: 3.8 g/dL (ref 3.5–5.2)
Alkaline Phosphatase: 75 U/L (ref 39–117)
BUN: 27 mg/dL — ABNORMAL HIGH (ref 6–23)
CO2: 27 meq/L (ref 19–32)
CREATININE: 1.32 mg/dL — AB (ref 0.50–1.10)
Calcium: 9.1 mg/dL (ref 8.4–10.5)
Chloride: 100 mEq/L (ref 96–112)
GFR, EST AFRICAN AMERICAN: 47 mL/min — AB
GFR, EST NON AFRICAN AMERICAN: 41 mL/min — AB
GLUCOSE: 113 mg/dL — AB (ref 70–99)
Potassium: 3.7 mEq/L (ref 3.5–5.3)
Sodium: 139 mEq/L (ref 135–145)
Total Bilirubin: 0.4 mg/dL (ref 0.2–1.2)
Total Protein: 7.5 g/dL (ref 6.0–8.3)

## 2014-07-20 LAB — POCT URINALYSIS DIPSTICK
Bilirubin, UA: NEGATIVE
GLUCOSE UA: NEGATIVE
Ketones, UA: NEGATIVE
Nitrite, UA: NEGATIVE
SPEC GRAV UA: 1.02
UROBILINOGEN UA: 0.2
pH, UA: 6

## 2014-07-20 MED ORDER — AMOXICILLIN 500 MG PO CAPS: 1000.0000 mg | ORAL_CAPSULE | Freq: Two times a day (BID) | ORAL | Status: DC

## 2014-07-20 NOTE — Progress Notes (Signed)
07/20/2014 at 1:47 PM  Christine Parrish / DOB: 08-19-1945 / MRN: [id number]  The patient has SARCOIDOSIS; HYPERTENSION; ASTHMA; SLEEP APNEA; CERVICAL CANCER, HX OF; Cervical neuropathic pain; DM (diabetes mellitus); Severe obesity (BMI >= 40); Upper airway cough syndrome; OSA (obstructive sleep apnea); Obesity, Class III, BMI 40-49.9 (morbid obesity); Snoring; and Hypoxemia on her problem list.  SUBJECTIVE  Chief complaint: Dysuria and Hematuria  Christine Parrish is a 69 year old well controlled diabetic here for dysuria, frequency, urgency, and hematuria.  She was treated for same on 06/27/14 and was diagnosed with UTI and placed on cipro which she finished are reports did not really help her symptoms.  Culture grew out lactobacillus.  She denies vaginal discharge and vaginal irritation today.   She  has a past medical history of Arthritis; Asthma; Hypertension; Cancer; Sleep apnea; DM (diabetes mellitus); and Hypoxemia (09/25/2013).    Medications reviewed and updated by myself where necessary, and exist elsewhere in the encounter.   Christine Parrish has No Known Allergies. She  reports that she quit smoking about 11 years ago. Her smoking use included Cigarettes. She has a 52.5 pack-year smoking history. She has never used smokeless tobacco. She reports that she does not drink alcohol or use illicit drugs. She  reports that she does not engage in sexual activity. The patient  has past surgical history that includes Eye surgery; Tubal ligation; and Abdominal hysterectomy.  Her family history includes Asthma in her daughter, mother, and sister; CAD (age of onset: 44) in her brother; CAD (age of onset: 87) in her mother; Diabetes in her father; Emphysema in her mother.  Review of Systems  Constitutional: Negative for fever and chills.  Respiratory: Negative for cough.   Cardiovascular: Negative for chest pain.  Gastrointestinal: Negative for nausea, vomiting and abdominal pain.  Genitourinary: Positive for  dysuria, urgency, frequency and hematuria. Negative for flank pain.  Skin: Negative for itching and rash.    OBJECTIVE  Her  height is 5\' 6"  (1.676 m) and weight is 327 lb (148.326 kg). Her oral temperature is 98.7 F (37.1 C). Her blood pressure is 136/84 and her pulse is 73. Her respiration is 17 and oxygen saturation is 95%.  The patient's body mass index is 52.8 kg/(m^2).  Physical Exam  Constitutional: She is oriented to person, place, and time. She appears well-developed and well-nourished.  Cardiovascular: Normal rate.   Respiratory: Effort normal.  GI: Soft. She exhibits no distension and no mass. There is no tenderness. There is no rebound and no guarding.  Musculoskeletal: Normal range of motion.  Neurological: She is alert and oriented to person, place, and time.  Skin: Skin is warm and dry.  Psychiatric: She has a normal mood and affect.    Results for orders placed or performed in visit on 07/20/14 (from the past 24 hour(s))  POCT UA - Microscopic Only     Status: None   Collection Time: 07/20/14  1:38 PM  Result Value Ref Range   WBC, Ur, HPF, POC tntc    RBC, urine, microscopic tntc    Bacteria, U Microscopic unable to read    Mucus, UA neg    Epithelial cells, urine per micros unable to read    Crystals, Ur, HPF, POC neg    Casts, Ur, LPF, POC neg    Yeast, UA neg   POCT urinalysis dipstick     Status: Abnormal   Collection Time: 07/20/14  1:38 PM  Result Value Ref Range  Color, UA yellow    Clarity, UA cloudy    Glucose, UA neg    Bilirubin, UA neg    Ketones, UA neg    Spec Grav, UA 1.020    Blood, UA large    pH, UA 6.0    Protein, UA >=300    Urobilinogen, UA 0.2    Nitrite, UA neg    Leukocytes, UA moderate (2+) (A) Negative    ASSESSMENT & PLAN  Christine Parrish was seen today for dysuria and hematuria.  Diagnoses and all orders for this visit:  Dysuria Orders: -     POCT UA - Microscopic Only -     POCT urinalysis dipstick  Elevated serum  creatinine Orders: -     COMPLETE METABOLIC PANEL WITH GFR  Urinary tract infection with hematuria, site unspecified: Patient with continued UTI like symptoms. Her last UA did show leukourea. Given the organism specified on culture Cipro would not be effective.  Will move patient to amoxicillin per [facility].     Orders: -     amoxicillin (AMOXIL) 500 MG capsule; Take 2 capsules (1,000 mg total) by mouth 2 (two) times daily.    The patient was advised to call or come back to clinic if she does not see an improvement in symptoms, or worsens with the above plan.   Philis Fendt, MHS, PA-C [facility] 07/20/2014 1:47 PM

## 2014-07-20 NOTE — Progress Notes (Signed)
Patient was here for a follow-up of a persistent UTI. The culture grew lactobacillus and did not respond to Cipro. Sensitivities were not run, but this bacteria is typically sensitive to penicillin. That is probably from a the medication did not work. Recommend putting on amoxicillin and following up to make sure it clears. Discussed with Philis Fendt, PA. Treatment plan agreed upon.

## 2014-08-05 ENCOUNTER — Encounter: Payer: Self-pay | Admitting: *Deleted

## 2014-08-13 ENCOUNTER — Ambulatory Visit (INDEPENDENT_AMBULATORY_CARE_PROVIDER_SITE_OTHER): Payer: Medicare Other | Admitting: Internal Medicine

## 2014-08-13 VITALS — BP 120/90 | HR 114 | Temp 98.7°F | Resp 18 | Ht 66.0 in | Wt 327.0 lb

## 2014-08-13 DIAGNOSIS — N390 Urinary tract infection, site not specified: Secondary | ICD-10-CM | POA: Diagnosis not present

## 2014-08-13 DIAGNOSIS — I10 Essential (primary) hypertension: Secondary | ICD-10-CM

## 2014-08-13 DIAGNOSIS — R300 Dysuria: Secondary | ICD-10-CM

## 2014-08-13 DIAGNOSIS — R8281 Pyuria: Secondary | ICD-10-CM

## 2014-08-13 LAB — POCT URINALYSIS DIPSTICK
GLUCOSE UA: 100
Nitrite, UA: POSITIVE
Spec Grav, UA: 1.02
Urobilinogen, UA: 2
pH, UA: 5

## 2014-08-13 LAB — POCT UA - MICROSCOPIC ONLY
Bacteria, U Microscopic: NEGATIVE
CASTS, UR, LPF, POC: NEGATIVE
CRYSTALS, UR, HPF, POC: NEGATIVE
Mucus, UA: NEGATIVE
YEAST UA: NEGATIVE

## 2014-08-13 MED ORDER — VALSARTAN 80 MG PO TABS: 80.0000 mg | ORAL_TABLET | Freq: Every day | ORAL | Status: DC

## 2014-08-13 MED ORDER — SULFAMETHOXAZOLE-TRIMETHOPRIM 800-160 MG PO TABS: 1.0000 | ORAL_TABLET | Freq: Two times a day (BID) | ORAL | Status: DC

## 2014-08-13 MED ORDER — HYDROCHLOROTHIAZIDE 25 MG PO TABS: 25.0000 mg | ORAL_TABLET | Freq: Every day | ORAL | Status: DC

## 2014-08-13 NOTE — Progress Notes (Addendum)
Subjective:  This chart was scribed for Christine Lin, MD by Christine Parrish, Medical Scribe. This patient was seen in Room 10 and the patient's care was started at 4:22 PM.   Patient ID: Christine Parrish, female    DOB: 1945/12/19, 69 y.o.   MRN: [id number]  HPI HPI Comments: ANOUSHKA Parrish is a 69 y.o. female who presents to [facility] for a follow up on a UTI.  Pt was recently diagnosed with DM and UTI 06/26/2014 with culture showing lactobacillus. She did not respond to Cipro and was changed to amoxicillin on 07/18/14 Pt notes that the amoxicillin did giver her relief for about 2 days; however the symptoms of the UTI returned after that. She reports that cranberry juice has given her some relief. Pt reports that she does present with back pain. Pt denies symptoms of nausea, diarrhea, vomiting, or abdominal pain. She has dysuria frequency and urgency. No vaginal discharge. NSA.  About 10 years ago she had a series of bladder infections and was referred for evaluation by gynecology to diagnosed a prolapsed bladder and uterus. Was told there was nothing they could do. She then did not have trouble for the last 10 years.  Pt also requests refills for her HTN medications. She currently is on losartan and hydrochlorothiazide. She describes no problems with blood pressure control. She has no cardiac symptoms.   Blood sugar today 90 fasting at home///see recent hemoglobin A1c which had good results Primary care provider Dr. Terrill Mohr is to follow-up by appointment later this summer for diabetes  Patient Active Problem List   Diagnosis Date Noted  . OSA (obstructive sleep apnea) 09/25/2013  . Obesity, Class III, BMI 40-49.9 (morbid obesity) 09/25/2013  . Snoring 09/25/2013  . Hypoxemia 09/25/2013  . Upper airway cough syndrome 09/17/2013  . DM (diabetes mellitus) 06/10/2013  . Severe obesity (BMI >= 40) 06/10/2013  . Cervical neuropathic pain 05/24/2012  . SARCOIDOSIS  12/02/2007  . HYPERTENSION 12/02/2007  . ASTHMA 12/02/2007  . SLEEP APNEA 12/02/2007  . CERVICAL CANCER, HX OF 12/02/2007      Review of Systems  Constitutional: Negative for fatigue and unexpected weight change.  HENT: Negative for trouble swallowing.   Eyes: Negative for visual disturbance.  Respiratory: Negative for shortness of breath.   Cardiovascular: Negative for chest pain, palpitations and leg swelling.  Gastrointestinal: Negative for nausea, vomiting, abdominal pain and diarrhea.  Genitourinary: Positive for dysuria, urgency and frequency. Negative for vaginal discharge.  Musculoskeletal: Positive for back pain.  Neurological: Negative for headaches.      Objective:   Physical Exam  Constitutional: She is oriented to person, place, and time. She appears well-developed and well-nourished. No distress.  HENT:  Head: Normocephalic and atraumatic.  Eyes: Conjunctivae and EOM are normal. Pupils are equal, round, and reactive to light.  Neck: Neck supple. No thyromegaly present.  Cardiovascular: Normal rate, regular rhythm and normal heart sounds.   Pulse 75  Pulmonary/Chest: Effort normal.  Abdominal: Soft. She exhibits no distension. There is no tenderness. There is no guarding.  No CVA tenderness to percussion  Musculoskeletal: She exhibits no edema.  Neurological: She is alert and oriented to person, place, and time. No cranial nerve deficit.  Skin: Skin is warm and dry.  Psychiatric: She has a normal mood and affect. Her behavior is normal.  Nursing note and vitals reviewed.  BP 120/90 mmHg  Pulse 114  Temp(Src) 98.7 F (37.1 C) (Oral)  Resp 18  Ht 5\' 6"  (1.676 m)  Wt 327 lb (148.326 kg)  BMI 52.80 kg/m2  SpO2 94%     Assessment & Plan:  I have completed the patient encounter in its entirety as documented by the scribe, with editing by me where necessary. Robert P. Laney Pastor, M.D.  Dysuria with Pyuria -representing a second recent bladder infection or  urinary tract infection  Plan: Urine culture/start treatment with Septra//depending on culture results may need urological follow-up  We discussed potential anatomical abnormalities that could cause this  Essential hypertension - Plan: hydrochlorothiazide (HYDRODIURIL) 25 MG tablet  because she is stable we will refill her medications  Diabetes-controlled  Follow-up is planned with Dr. Tamala Julian  Morbid obesity--still not making progress on this  Meds ordered this encounter  Medications  . hydrochlorothiazide (HYDRODIURIL) 25 MG tablet    Sig: Take 1 tablet (25 mg total) by mouth daily.    Dispense:  90 tablet    Refill:  3  . valsartan (DIOVAN) 80 MG tablet    Sig: Take 1 tablet (80 mg total) by mouth daily.    Dispense:  90 tablet    Refill:  3  . sulfamethoxazole-trimethoprim (BACTRIM DS,SEPTRA DS) 800-160 MG per tablet    Sig: Take 1 tablet by mouth 2 (two) times daily.    Dispense:  30 tablet    Refill:  0

## 2014-08-14 LAB — URINE CULTURE: Colony Count: 35000

## 2014-08-25 ENCOUNTER — Encounter: Payer: Self-pay | Admitting: Family Medicine

## 2014-10-03 ENCOUNTER — Other Ambulatory Visit: Payer: Self-pay | Admitting: Internal Medicine

## 2014-10-04 ENCOUNTER — Telehealth: Payer: Self-pay

## 2014-10-04 NOTE — Telephone Encounter (Signed)
Pt was seen in July and says that she had requested med refills for her bp and diabetes.  She first said we didn't call in either one, but I told her we did call in the valsartan with 3 refills.  The diabetes medicine, she could not remember the name of it exactly, but I think she was trying to say metformin?  Please call her at [phone]

## 2014-10-05 ENCOUNTER — Other Ambulatory Visit: Payer: Self-pay

## 2014-10-05 DIAGNOSIS — E119 Type 2 diabetes mellitus without complications: Secondary | ICD-10-CM

## 2014-10-05 NOTE — Telephone Encounter (Signed)
Patient is calling because her metformin has expired and she needs a refill. She states she no longer needs the blood pressure medication she requested in an earlier message

## 2014-10-05 NOTE — Telephone Encounter (Signed)
Left VM for pt to call back. According to pt's current medication list she is not taking Metformin. Dr. Laney Pastor mentioned in his latest note that pt is to follow up with Dr. Tamala Julian later in the summer for diabetes.  Pt called back and spoke to [place]. In [place] phone message pt is just requesting refill of Metformin; does not need refill of HTN meds.

## 2014-10-05 NOTE — Telephone Encounter (Signed)
Patient is returning a missed phone call from [facility]

## 2014-10-06 MED ORDER — METFORMIN HCL 500 MG PO TABS: 500.0000 mg | ORAL_TABLET | Freq: Two times a day (BID) | ORAL | Status: DC

## 2014-10-06 NOTE — Telephone Encounter (Signed)
The following notes were copied from 06/2014 OV w/ Dr Laney Pastor, the last time DM is specifically discussed:  Metformin was started in May 2015 when her hemoglobin A1c was found to be 8.5 but she is not on any medication at the current time. A1c was 6.4 in December on no medication.  We can continue medications at current doses until her follow-up/no current treatment except diet for diabetes.  I spoke to pt and she reported that she has ALWAYS taken her metformin, she never stopped taking it since Dr Lorelei Pont first put her on it. I advised that she is due to come in and see Dr Tamala Julian for a DM check up, but she said that she can not come in until after the 25th, because she doesn't have any money in her acct (d/t eye doc appt, etc). Is it OK to give her a mos RF until she gets in?

## 2014-10-06 NOTE — Telephone Encounter (Signed)
Serita Kyle, Rad Tech at 10/05/2014 3:26 PM     Status: Signed       Expand All Collapse All   Left VM for pt to call back. According to pt's current medication list she is not taking Metformin. Dr. Laney Pastor mentioned in his latest note that pt is to follow up with Dr. Tamala Julian later in the summer for diabetes.  Pt called back and spoke to [place]. In [place] phone message pt is just requesting refill of Metformin; does not need refill of HTN meds.

## 2014-10-07 NOTE — Telephone Encounter (Signed)
Called pt and notified her on VM of refill and reminded to come in for f/up.

## 2014-11-05 ENCOUNTER — Ambulatory Visit (INDEPENDENT_AMBULATORY_CARE_PROVIDER_SITE_OTHER): Payer: Medicare Other | Admitting: Emergency Medicine

## 2014-11-05 VITALS — BP 128/66 | HR 96 | Temp 98.9°F | Resp 16 | Ht 66.0 in | Wt 337.0 lb

## 2014-11-05 DIAGNOSIS — J209 Acute bronchitis, unspecified: Secondary | ICD-10-CM | POA: Diagnosis not present

## 2014-11-05 DIAGNOSIS — E119 Type 2 diabetes mellitus without complications: Secondary | ICD-10-CM

## 2014-11-05 DIAGNOSIS — J0140 Acute pansinusitis, unspecified: Secondary | ICD-10-CM

## 2014-11-05 LAB — LIPID PANEL
Cholesterol: 172 mg/dL (ref 125–200)
HDL: 58 mg/dL (ref >=46)
LDL CALC: 93 mg/dL (ref <130)
TRIGLYCERIDES: 106 mg/dL (ref <150)
Total CHOL/HDL Ratio: 3 Ratio (ref <=5.0)
VLDL: 21 mg/dL (ref <30)

## 2014-11-05 LAB — CBC
HCT: 38.5 % (ref 36.0–46.0)
HEMOGLOBIN: 12.7 g/dL (ref 12.0–15.0)
MCH: 27.7 pg (ref 26.0–34.0)
MCHC: 33 g/dL (ref 30.0–36.0)
MCV: 83.9 fL (ref 78.0–100.0)
MPV: 11.3 fL (ref 8.6–12.4)
PLATELETS: 290 10*3/uL (ref 150–400)
RBC: 4.59 MIL/uL (ref 3.87–5.11)
RDW: 14.5 % (ref 11.5–15.5)
WBC: 10.3 10*3/uL (ref 4.0–10.5)

## 2014-11-05 LAB — COMPREHENSIVE METABOLIC PANEL
ALT: 12 U/L (ref 6–29)
AST: 15 U/L (ref 10–35)
Albumin: 3.7 g/dL (ref 3.6–5.1)
Alkaline Phosphatase: 75 U/L (ref 33–130)
BILIRUBIN TOTAL: 0.4 mg/dL (ref 0.2–1.2)
BUN: 19 mg/dL (ref 7–25)
CHLORIDE: 100 mmol/L (ref 98–110)
CO2: 27 mmol/L (ref 20–31)
CREATININE: 1.09 mg/dL — AB (ref 0.50–0.99)
Calcium: 9.1 mg/dL (ref 8.6–10.4)
GLUCOSE: 120 mg/dL — AB (ref 65–99)
Potassium: 3.8 mmol/L (ref 3.5–5.3)
SODIUM: 138 mmol/L (ref 135–146)
Total Protein: 7.3 g/dL (ref 6.1–8.1)

## 2014-11-05 LAB — HEMOGLOBIN A1C: Hgb A1c MFr Bld: 6.1 % — AB (ref 4.0–6.0)

## 2014-11-05 LAB — POCT GLYCOSYLATED HEMOGLOBIN (HGB A1C): HEMOGLOBIN A1C: 6.1

## 2014-11-05 LAB — GLUCOSE, POCT (MANUAL RESULT ENTRY): POC GLUCOSE: 138 mg/dL — AB (ref 70–99)

## 2014-11-05 MED ORDER — AMOXICILLIN-POT CLAVULANATE 875-125 MG PO TABS: 1.0000 | ORAL_TABLET | Freq: Two times a day (BID) | ORAL | Status: DC

## 2014-11-05 MED ORDER — PSEUDOEPHEDRINE-GUAIFENESIN ER 60-600 MG PO TB12: 1.0000 | ORAL_TABLET | Freq: Two times a day (BID) | ORAL | Status: DC

## 2014-11-05 MED ORDER — HYDROCOD POLST-CPM POLST ER 10-8 MG/5ML PO SUER: 5.0000 mL | Freq: Two times a day (BID) | ORAL | Status: DC

## 2014-11-05 NOTE — Patient Instructions (Signed)

## 2014-11-05 NOTE — Progress Notes (Signed)
Subjective:  Patient ID: [name], female    DOB: [birth year]  Age: 69 y.o. MRN: [id number]  CC: Cough; Nasal Congestion; and Medication Refill   HPI LAILANIE HASLEY presents  with nasal congestion postnasal drainage and some purulent character. She has a cough productive of purulent sputum. She has no wheezing or shortness of breath. She has no fever or chills. She been ill for 2 weeks with these complaints. History of type 2 diabetes needs refills on her medication as well as blood work. No nausea vomiting or stool change. She has no improvement with over-the-counter medication  History Ameliyah has a past medical history of Arthritis; Asthma; Hypertension; Cancer ([place]); Sleep apnea; DM (diabetes mellitus) ([place]); and Hypoxemia (09/25/2013).   She has past surgical history that includes Eye surgery; Tubal ligation; and Abdominal hysterectomy.   Her  family history includes Asthma in her daughter, mother, and sister; CAD (age of onset: 1) in her brother; CAD (age of onset: 85) in her mother; Diabetes in her father; Emphysema in her mother.  She   reports that she quit smoking about 11 years ago. Her smoking use included Cigarettes. She has a 52.5 pack-year smoking history. She has never used smokeless tobacco. She reports that she does not drink alcohol or use illicit drugs.  Outpatient Prescriptions Prior to Visit  Medication Sig Dispense Refill  . Blood Glucose Monitoring Suppl (BLOOD GLUCOSE METER) kit Test blood sugar daily. Dx code: 250.92 1 each 0  . glucose blood test strip Test blood sugar daily. Dx code: 250.92. 100 each 3  . glucose blood test strip Use as instructed 100 each 12  . glucose monitoring kit (FREESTYLE) monitoring kit 1 each by Does not apply route as needed for other. 1 each 0  . hydrochlorothiazide (HYDRODIURIL) 25 MG tablet Take 1 tablet (25 mg total) by mouth daily. 90 tablet 3  . Lancets (ACCU-CHEK MULTICLIX) lancets Use as instructed 100 each 12  .  Lancets (ONETOUCH ULTRASOFT) lancets Test blood sugar daily. Dx code: 250.92 100 each 3  . metFORMIN (GLUCOPHAGE) 500 MG tablet Take 1 tablet (500 mg total) by mouth 2 (two) times daily with a meal. 60 tablet 0  . ofloxacin (OCUFLOX) 0.3 % ophthalmic solution 1 drop 4 (four) times daily.    . valsartan (DIOVAN) 80 MG tablet Take 1 tablet (80 mg total) by mouth daily. 90 tablet 3  . famotidine (PEPCID) 20 MG tablet Take 20 mg by mouth at bedtime.    Marland Kitchen amoxicillin (AMOXIL) 500 MG capsule Take 2 capsules (1,000 mg total) by mouth 2 (two) times daily. (Patient not taking: Reported on 08/13/2014) 28 capsule 0  . ciprofloxacin (CIPRO) 500 MG tablet Take 1 tablet (500 mg total) by mouth 2 (two) times daily. (Patient not taking: Reported on 07/20/2014) 20 tablet 0  . diphenhydrAMINE (BENADRYL) 2 % cream Apply topically 3 (three) times daily as needed for itching. (Patient not taking: Reported on 07/20/2014) 56.8 g 0  . hydrOXYzine (ATARAX/VISTARIL) 25 MG tablet Take 1 tablet (25 mg total) by mouth every 4 (four) hours as needed for itching. (Patient not taking: Reported on 07/20/2014) 30 tablet 0  . ketorolac (ACULAR) 0.4 % SOLN 1 drop 4 (four) times daily.    Marland Kitchen omeprazole (PRILOSEC) 40 MG capsule Take 1 capsule (40 mg total) by mouth every morning. (Patient not taking: Reported on 07/20/2014) 30 capsule 3  . phenazopyridine (AZO URINARY PAIN RELIEF) 95 MG tablet Take 95 mg by mouth 3 (  three) times daily as needed for pain.    Marland Kitchen sulfamethoxazole-trimethoprim (BACTRIM DS,SEPTRA DS) 800-160 MG per tablet Take 1 tablet by mouth 2 (two) times daily. (Patient not taking: Reported on 11/05/2014) 30 tablet 0   No facility-administered medications prior to visit.    Social History   Social History  . Marital Status: Widowed    Spouse Name: N/A  . Number of Children: 7  . Years of Education: 10   Occupational History  . Retired    Social History Main Topics  . Smoking status: Former Smoker -- 1.50 packs/day  for 35 years    Types: Cigarettes    Quit date: 01/23/2003  . Smokeless tobacco: Never Used  . Alcohol Use: No  . Drug Use: No  . Sexual Activity: No   Other Topics Concern  . None   Social History Narrative   Patient is single and her daughter lives with her.   Patient has six living children and one child is deceased.   Patient is retired.   Patient has a 10th grade education.   Patient is right-handed.   Patient drinks three cans of soda daily.           Review of Systems  Constitutional: Positive for fatigue. Negative for fever, chills and appetite change.  HENT: Positive for congestion, postnasal drip and sinus pressure. Negative for ear pain and sore throat.   Eyes: Negative for pain and redness.  Respiratory: Positive for cough. Negative for shortness of breath and wheezing.   Cardiovascular: Negative for leg swelling.  Gastrointestinal: Negative for nausea, vomiting, abdominal pain, diarrhea, constipation and blood in stool.  Endocrine: Negative for polyuria.  Genitourinary: Negative for dysuria, urgency, frequency and flank pain.  Musculoskeletal: Negative for gait problem.  Skin: Negative for rash.  Neurological: Negative for weakness and headaches.  Psychiatric/Behavioral: Negative for confusion and decreased concentration. The patient is not nervous/anxious.     Objective:  BP 128/66 mmHg  Pulse 96  Temp(Src) 98.9 F (37.2 C) (Oral)  Resp 16  Ht 5' 6"  (1.676 m)  Wt 337 lb (152.862 kg)  BMI 54.42 kg/m2  SpO2 93%  Physical Exam  Constitutional: She is oriented to person, place, and time. She appears well-developed and well-nourished. No distress.  HENT:  Head: Normocephalic and atraumatic.  Right Ear: External ear normal.  Left Ear: External ear normal.  Nose: Nose normal.  Eyes: Conjunctivae and EOM are normal. Pupils are equal, round, and reactive to light. No scleral icterus.  Neck: Normal range of motion. Neck supple. No tracheal deviation  present.  Cardiovascular: Normal rate, regular rhythm and normal heart sounds.   Pulmonary/Chest: Effort normal. No respiratory distress. She has no wheezes. She has no rales.  Abdominal: She exhibits no mass. There is no tenderness. There is no rebound and no guarding.  Musculoskeletal: She exhibits no edema.  Lymphadenopathy:    She has no cervical adenopathy.  Neurological: She is alert and oriented to person, place, and time. Coordination normal.  Skin: Skin is warm and dry. No rash noted.  Psychiatric: She has a normal mood and affect. Her behavior is normal.      Assessment & Plan:   Orian was seen today for cough, nasal congestion and medication refill.  Diagnoses and all orders for this visit:  Acute bronchitis, unspecified organism -     CBC  Acute pansinusitis, recurrence not specified -     CBC  Diabetes mellitus without complication ([place]) -  CBC -     Comprehensive metabolic panel -     Lipid panel -     POCT glucose (manual entry) -     POCT glycosylated hemoglobin (Hb A1C)  Other orders -     amoxicillin-clavulanate (AUGMENTIN) 875-125 MG tablet; Take 1 tablet by mouth 2 (two) times daily. -     pseudoephedrine-guaifenesin (MUCINEX D) 60-600 MG 12 hr tablet; Take 1 tablet by mouth every 12 (twelve) hours. -     chlorpheniramine-HYDROcodone (TUSSIONEX PENNKINETIC ER) 10-8 MG/5ML SUER; Take 5 mLs by mouth 2 (two) times daily.   I have discontinued Ms. Null's hydrOXYzine, diphenhydrAMINE, omeprazole, ciprofloxacin, amoxicillin, ketorolac, phenazopyridine, and sulfamethoxazole-trimethoprim. I am also having her start on amoxicillin-clavulanate, pseudoephedrine-guaifenesin, and chlorpheniramine-HYDROcodone. Additionally, I am having her maintain her onetouch ultrasoft, glucose blood, blood glucose meter kit and supplies, glucose monitoring kit, glucose blood, accu-chek multiclix, famotidine, ofloxacin, hydrochlorothiazide, valsartan, and metFORMIN.  Meds  ordered this encounter  Medications  . amoxicillin-clavulanate (AUGMENTIN) 875-125 MG tablet    Sig: Take 1 tablet by mouth 2 (two) times daily.    Dispense:  20 tablet    Refill:  0  . pseudoephedrine-guaifenesin (MUCINEX D) 60-600 MG 12 hr tablet    Sig: Take 1 tablet by mouth every 12 (twelve) hours.    Dispense:  18 tablet    Refill:  0  . chlorpheniramine-HYDROcodone (TUSSIONEX PENNKINETIC ER) 10-8 MG/5ML SUER    Sig: Take 5 mLs by mouth 2 (two) times daily.    Dispense:  60 mL    Refill:  0    Appropriate red flag conditions were discussed with the patient as well as actions that should be taken.  Patient expressed his understanding.  Follow-up: Return if symptoms worsen or fail to improve.  Roselee Culver, MD   Results for orders placed or performed in visit on 11/05/14  POCT glucose (manual entry)  Result Value Ref Range   POC Glucose 138 (A) 70 - 99 mg/dl  POCT glycosylated hemoglobin (Hb A1C)  Result Value Ref Range   Hemoglobin A1C 6.1

## 2014-11-16 ENCOUNTER — Other Ambulatory Visit: Payer: Self-pay | Admitting: *Deleted

## 2014-11-16 ENCOUNTER — Other Ambulatory Visit: Payer: Self-pay | Admitting: Internal Medicine

## 2014-11-16 DIAGNOSIS — E119 Type 2 diabetes mellitus without complications: Secondary | ICD-10-CM

## 2014-11-16 MED ORDER — METFORMIN HCL 500 MG PO TABS: 500.0000 mg | ORAL_TABLET | Freq: Two times a day (BID) | ORAL | Status: DC

## 2014-11-19 ENCOUNTER — Encounter: Payer: Self-pay | Admitting: Family Medicine

## 2014-11-20 ENCOUNTER — Telehealth: Payer: Self-pay

## 2014-11-20 NOTE — Telephone Encounter (Signed)
Patient called in today stating that she requested her Metformin on the 25th and she has not heard anything as of today the 29th and she is completely out of pills now because it has taken so long. She uses the [facility] on [address]. Marking high priority because she was very upset and out of meds.

## 2014-11-21 NOTE — Telephone Encounter (Signed)
It was sent in on 10/25 to [facility]. Pharmacy confirmed receipt. Called pharmacy and they confirmed they received and have it ready. I called patient and left message to return call.

## 2014-11-22 NOTE — Telephone Encounter (Signed)
Left message that medication was at pharmacy

## 2015-04-05 ENCOUNTER — Ambulatory Visit (INDEPENDENT_AMBULATORY_CARE_PROVIDER_SITE_OTHER): Payer: Medicare Other | Admitting: Family Medicine

## 2015-04-05 VITALS — BP 122/86 | HR 92 | Temp 97.7°F | Resp 18 | Wt 345.6 lb

## 2015-04-05 DIAGNOSIS — R05 Cough: Secondary | ICD-10-CM | POA: Diagnosis not present

## 2015-04-05 DIAGNOSIS — I10 Essential (primary) hypertension: Secondary | ICD-10-CM | POA: Diagnosis not present

## 2015-04-05 DIAGNOSIS — R058 Other specified cough: Secondary | ICD-10-CM

## 2015-04-05 DIAGNOSIS — E119 Type 2 diabetes mellitus without complications: Secondary | ICD-10-CM

## 2015-04-05 DIAGNOSIS — R059 Cough, unspecified: Secondary | ICD-10-CM

## 2015-04-05 LAB — BASIC METABOLIC PANEL WITH GFR
BUN: 18 mg/dL (ref 7–25)
CO2: 28 mmol/L (ref 20–31)
Calcium: 9.2 mg/dL (ref 8.6–10.4)
Chloride: 98 mmol/L (ref 98–110)
Creat: 1.05 mg/dL — ABNORMAL HIGH (ref 0.60–0.93)
GFR, Est African American: 62 mL/min (ref >=60)
GFR, Est Non African American: 54 mL/min — ABNORMAL LOW (ref >=60)
Glucose, Bld: 123 mg/dL — ABNORMAL HIGH (ref 65–99)
Potassium: 3.8 mmol/L (ref 3.5–5.3)
Sodium: 141 mmol/L (ref 135–146)

## 2015-04-05 LAB — POCT GLYCOSYLATED HEMOGLOBIN (HGB A1C): Hemoglobin A1C: 6.2

## 2015-04-05 MED ORDER — HYDROCOD POLST-CPM POLST ER 10-8 MG/5ML PO SUER: 5.0000 mL | Freq: Two times a day (BID) | ORAL | Status: DC

## 2015-04-05 MED ORDER — HYDROCHLOROTHIAZIDE 25 MG PO TABS: 25.0000 mg | ORAL_TABLET | Freq: Every day | ORAL | Status: DC

## 2015-04-05 MED ORDER — VALSARTAN 80 MG PO TABS: 80.0000 mg | ORAL_TABLET | Freq: Every day | ORAL | Status: DC

## 2015-04-05 MED ORDER — METFORMIN HCL 500 MG PO TABS: 500.0000 mg | ORAL_TABLET | Freq: Two times a day (BID) | ORAL | Status: DC

## 2015-04-05 NOTE — Patient Instructions (Addendum)
1. No sodas or juice  2. Eggs are good.  3. Exercise is good for the heart, the muscles, the lungs, and your disposition, but it does not lower your weight.  4. Try to eat your evening meal at least 3 hours before bedtime, then do not snack  5. No second helpings.    IF you received an x-ray today, you will receive an invoice from [facility]. Please contact [facility] at [phone] with questions or concerns regarding your invoice.   IF you received labwork today, you will receive an invoice from [facility]. Please contact [facility] at [phone] with questions or concerns regarding your invoice.   Our billing staff will not be able to assist you with questions regarding bills from these companies.  You will be contacted with the lab results as soon as they are available. The fastest way to get your results is to activate your My Chart account. Instructions are located on the last page of this paperwork. If you have not heard from [place] regarding the results in 2 weeks, please contact this office.

## 2015-04-05 NOTE — Progress Notes (Signed)
Patient ID: Christine Parrish MRN: [id number], DOB: [birth year], 70 y.o. Date of Encounter: 04/05/2015, 1:43 PM  Primary Physician: Reginia Forts, MD  Chief Complaint: Diabetes follow up  HPI: 70 y.o. year old female with history below presents for follow up of diabetes mellitus. Doing well. No issues or complaints. Taking medications daily without adverse effects. No polydipsia, polyphagia, polyuria, or nocturia.  Blood sugars at home: Diet consists of: Exercising regularly. Last A1C:   Eye MD: DDS: Influenza vaccine: Pneumococcal vaccine: Last CPE:  Past Medical History  Diagnosis Date  . Arthritis   . Asthma   . Hypertension   . Cancer (HCC)     Cervical  . Sleep apnea     No CPAP (broken)  . DM (diabetes mellitus) ([place])   . Hypoxemia 09/25/2013     Home Meds: Prior to Admission medications   Medication Sig Start Date End Date Taking? Authorizing Provider  aspirin 81 MG tablet Take 81 mg by mouth daily.   Yes Historical Provider, MD  Blood Glucose Monitoring Suppl (BLOOD GLUCOSE METER) kit Test blood sugar daily. Dx code: 250.92 06/22/13  Yes Heather Elnora Morrison, PA-C  famotidine (PEPCID) 20 MG tablet Take 20 mg by mouth at bedtime.   Yes Historical Provider, MD  glucose blood test strip Test blood sugar daily. Dx code: 76.92. 06/22/13  Yes Collene Leyden, PA-C  glucose blood test strip Use as instructed 06/22/13  Yes Sheryl Precious Haws, DO  glucose monitoring kit (FREESTYLE) monitoring kit 1 each by Does not apply route as needed for other. 06/22/13  Yes Sheryl Precious Haws, DO  hydrochlorothiazide (HYDRODIURIL) 25 MG tablet Take 1 tablet (25 mg total) by mouth daily. 04/05/15  Yes Robyn Haber, MD  Lancets (ACCU-CHEK MULTICLIX) lancets Use as instructed 06/22/13  Yes Sheryl Precious Haws, DO  Lancets [facility] ULTRASOFT) lancets Test blood sugar daily. Dx code: 250.92 06/22/13  Yes Collene Leyden, PA-C  metFORMIN (GLUCOPHAGE) 500 MG tablet Take 1 tablet (500 mg total) by mouth 2 (two)  times daily with a meal. 04/05/15  Yes Robyn Haber, MD  valsartan (DIOVAN) 80 MG tablet Take 1 tablet (80 mg total) by mouth daily. 04/05/15  Yes Robyn Haber, MD  chlorpheniramine-HYDROcodone [facility]) 10-8 MG/5ML SUER Take 5 mLs by mouth 2 (two) times daily. 04/05/15   Robyn Haber, MD    Allergies:  Allergies  Allergen Reactions  . Lisinopril Cough    Social History   Social History  . Marital Status: Widowed    Spouse Name: N/A  . Number of Children: 7  . Years of Education: 10   Occupational History  . Retired    Social History Main Topics  . Smoking status: Former Smoker -- 1.50 packs/day for 35 years    Types: Cigarettes    Quit date: 01/23/2003  . Smokeless tobacco: Never Used  . Alcohol Use: No  . Drug Use: No  . Sexual Activity: No   Other Topics Concern  . Not on file   Social History Narrative   Patient is single and her daughter lives with her.   Patient has six living children and one child is deceased.   Patient is retired.   Patient has a 10th grade education.   Patient is right-handed.   Patient drinks three cans of soda daily.           Lab Results  Component Value Date   HGBA1C 6.1 11/05/2014    Review of Systems: Constitutional: negative  for chills, fever, night sweats, weight changes, or fatigue  HEENT: negative for vision changes, hearing loss, congestion, rhinorrhea, or epistaxis Cardiovascular: negative for chest pain, palpitations, diaphoresis, DOE, orthopnea, or edema Respiratory: negative for hemoptysis, wheezing, shortness of breath, dyspnea, or cough Abdominal: negative for abdominal pain, nausea, vomiting, diarrhea, or constipation Dermatological: negative for rash, erythema, or wounds Neurologic: negative for headache, dizziness, or syncope Renal:  Negative for polyuria, polydipsia, or dysuria All other systems reviewed and are otherwise negative with the exception to those above and in the  HPI.   Physical Exam: Blood pressure 122/86, pulse 92, temperature 97.7 F (36.5 C), temperature source Oral, resp. rate 18, weight 345 lb 9.6 oz (156.763 kg), SpO2 95 %., Body mass index is 55.81 kg/(m^2). Wt Readings from Last 3 Encounters:  04/05/15 345 lb 9.6 oz (156.763 kg)  11/05/14 337 lb (152.862 kg)  08/13/14 327 lb (148.326 kg)   BP Readings from Last 3 Encounters:  04/05/15 122/86  11/05/14 128/66  08/13/14 120/90   General: Well developed, well nourished, in no acute distress. Head: Normocephalic, atraumatic, eyes without discharge, sclera non-icteric, nares are without discharge. Bilateral auditory canals clear, TM's are without perforation, pearly grey and translucent with reflective cone of light bilaterally. Oral cavity moist, posterior pharynx without exudate, erythema, peritonsillar abscess, or post nasal drip.  Neck: Supple. No thyromegaly. Full ROM. No lymphadenopathy. Lungs: Clear bilaterally to auscultation without wheezes, rales, or rhonchi. Breathing is unlabored. Heart: RRR with S1 S2. No murmurs, rubs, or gallops appreciated. Abdomen: Soft, non-tender, non-distended with normoactive bowel sounds. No hepatosplenomegaly. No rebound/guarding. No obvious abdominal masses. Msk:  Strength and tone normal for age. Extremities/Skin: Warm and dry. No clubbing or cyanosis. No edema. No rashes, wounds, or suspicious lesions. Monofilament exam  Normal per visiting nurse.  Neuro: Alert and oriented X 3. Moves all extremities spontaneously. Gait is normal. CNII-XII grossly in tact. Psych:  Responds to questions appropriately with a normal affect.    ASSESSMENT AND PLAN:  70 y.o. year old female with  -   ICD-9-CM ICD-10-CM   1. Essential hypertension 401.9 I10 valsartan (DIOVAN) 80 MG tablet     hydrochlorothiazide (HYDRODIURIL) 25 MG tablet  2. Diabetes mellitus without complication (HCC) 182.99 E11.9 valsartan (DIOVAN) 80 MG tablet     metFORMIN (GLUCOPHAGE) 500 MG  tablet     POCT glycosylated hemoglobin (Hb A1C)     BASIC METABOLIC PANEL WITH GFR  3. Cough 786.2 R05 chlorpheniramine-HYDROcodone ([place] [facility]) 10-8 MG/5ML SUER    Signed, Robyn Haber, MD 04/05/2015 1:43 PM

## 2015-06-01 ENCOUNTER — Ambulatory Visit (INDEPENDENT_AMBULATORY_CARE_PROVIDER_SITE_OTHER): Payer: Medicare Other | Admitting: Family Medicine

## 2015-06-01 VITALS — BP 124/76 | HR 108 | Temp 98.3°F | Resp 18 | Ht 66.0 in | Wt 326.0 lb

## 2015-06-01 DIAGNOSIS — J45901 Unspecified asthma with (acute) exacerbation: Secondary | ICD-10-CM | POA: Diagnosis not present

## 2015-06-01 MED ORDER — ALBUTEROL SULFATE (2.5 MG/3ML) 0.083% IN NEBU: 2.5000 mg | INHALATION_SOLUTION | Freq: Four times a day (QID) | RESPIRATORY_TRACT | Status: DC | PRN

## 2015-06-01 MED ORDER — AZITHROMYCIN 250 MG PO TABS: ORAL_TABLET | ORAL | Status: DC

## 2015-06-01 MED ORDER — PREDNISONE 20 MG PO TABS: 40.0000 mg | ORAL_TABLET | Freq: Every day | ORAL | Status: DC

## 2015-06-01 NOTE — Patient Instructions (Addendum)
Great to meet you!   Take all antibiotics, prednisone will make your sugars higher for about 2 weeks  Go ahead and use your albuterol nebulizer 4 times aday for the next 3 days  Please seek medical attention if your symptoms get worse.   Asthma, Acute Bronchospasm Acute bronchospasm caused by asthma is also referred to as an asthma attack. Bronchospasm means your air passages become narrowed. The narrowing is caused by inflammation and tightening of the muscles in the air tubes (bronchi) in your lungs. This can make it hard to breathe or cause you to wheeze and cough. CAUSES Possible triggers are:  Animal dander from the skin, hair, or feathers of animals.  Dust mites contained in house dust.  Cockroaches.  Pollen from trees or grass.  Mold.  Cigarette or tobacco smoke.  Air pollutants such as dust, household cleaners, hair sprays, aerosol sprays, paint fumes, strong chemicals, or strong odors.  Cold air or weather changes. Cold air may trigger inflammation. Winds increase molds and pollens in the air.  Strong emotions such as crying or laughing hard.  Stress.  Certain medicines such as aspirin or beta-blockers.  Sulfites in foods and drinks, such as dried fruits and wine.  Infections or inflammatory conditions, such as a flu, cold, or inflammation of the nasal membranes (rhinitis).  Gastroesophageal reflux disease (GERD). GERD is a condition where stomach acid backs up into your esophagus.  Exercise or strenuous activity. SIGNS AND SYMPTOMS   Wheezing.  Excessive coughing, particularly at night.  Chest tightness.  Shortness of breath. DIAGNOSIS  Your health care provider will ask you about your medical history and perform a physical exam. A chest X-ray or blood testing may be performed to look for other causes of your symptoms or other conditions that may have triggered your asthma attack. TREATMENT  Treatment is aimed at reducing inflammation and opening up  the airways in your lungs. Most asthma attacks are treated with inhaled medicines. These include quick relief or rescue medicines (such as bronchodilators) and controller medicines (such as inhaled corticosteroids). These medicines are sometimes given through an inhaler or a nebulizer. Systemic steroid medicine taken by mouth or given through an IV tube also can be used to reduce the inflammation when an attack is moderate or severe. Antibiotic medicines are only used if a bacterial infection is present.  HOME CARE INSTRUCTIONS   Rest.  Drink plenty of liquids. This helps the mucus to remain thin and be easily coughed up. Only use caffeine in moderation and do not use alcohol until you have recovered from your illness.  Do not smoke. Avoid being exposed to secondhand smoke.  You play a critical role in keeping yourself in good health. Avoid exposure to things that cause you to wheeze or to have breathing problems.  Keep your medicines up-to-date and available. Carefully follow your health care provider's treatment plan.  Take your medicine exactly as prescribed.  When pollen or pollution is bad, keep windows closed and use an air conditioner or go to places with air conditioning.  Asthma requires careful medical care. See your health care provider for a follow-up as advised. If you are more than [redacted] weeks pregnant and you were prescribed any new medicines, let your obstetrician know about the visit and how you are doing. Follow up with your health care provider as directed.  After you have recovered from your asthma attack, make an appointment with your outpatient doctor to talk about ways to reduce the likelihood  of future attacks. If you do not have a doctor who manages your asthma, make an appointment with a primary care doctor to discuss your asthma. SEEK IMMEDIATE MEDICAL CARE IF:   You are getting worse.  You have trouble breathing. If severe, call your local emergency services (911 in  the [place]  You develop chest pain or discomfort.  You are vomiting.  You are not able to keep fluids down.  You are coughing up yellow, green, brown, or bloody sputum.  You have a fever and your symptoms suddenly get worse.  You have trouble swallowing. MAKE SURE YOU:   Understand these instructions.  Will watch your condition.  Will get help right away if you are not doing well or get worse.   This information is not intended to replace advice given to you by your health care provider. Make sure you discuss any questions you have with your health care provider.   Document Released: 04/25/2006 Document Revised: 01/13/2013 Document Reviewed: 07/16/2012 Elsevier Interactive Patient Education [employer].

## 2015-06-01 NOTE — Progress Notes (Signed)
   HPI  Patient presents today here with cough.  Patient explains she's had difficulty with cough and shortness of breath over the last 2 months, however over the last 2 weeks it's gotten much worse.  She used her albuterol today without much improvement. She declines albuterol nebulizer in our clinic today, stating that it and it caused her to cough and wet herself.  She's had 2 weeks of worsening cough, shortness of breath, malaise, and difficulty sleeping due to cough.  She's tolerating food and fluids normally by mouth.  She had mild transient improvement with previous cough medicines.  Her diabetes is well controlled and she has good compliance with medications.  She previously had good improvement with prednisone.  PMH: Smoking status noted ROS: Per HPI  Objective: BP 124/76 mmHg  Pulse 108  Temp(Src) 98.3 F (36.8 C) (Oral)  Resp 18  Ht 5\' 6"  (1.676 m)  Wt 326 lb (147.873 kg)  BMI 52.64 kg/m2  SpO2 95% Gen: NAD, alert, cooperative with exam HEENT: NCAT, oropharynx clear, nares with swollen turbinates, TMs normal bilaterally CV: RRR, good S1/S2, no murmur Resp: Nonlabored, no wheezes, persistent cough with deep inspiration Ext: No edema, warm Neuro: Alert and oriented, No gross deficits  Assessment and plan:  # Asthma exacerbation Likely asthma exacerbation, worsening over the last 2 weeks. Cover with prednisone and azithromycin Albuterol scheduled 3 days Discussed worsening glycemic control with prednisone, patient would like to proceed anyway, it appears that she has very well-controlled diabetes, last A1c was 6.2. Return to clinic with any worsening symptoms or new concerns.  At the end of the visit she asked for something for reflux, I recommended Pepcid twice daily    Meds ordered this encounter  Medications  . azithromycin (ZITHROMAX) 250 MG tablet    Sig: Take 2 tablets on day 1 and 1 tablet daily after that    Dispense:  6 tablet    Refill:  0    . predniSONE (DELTASONE) 20 MG tablet    Sig: Take 2 tablets (40 mg total) by mouth daily with breakfast.    Dispense:  10 tablet    Refill:  0  . albuterol (PROVENTIL) (2.5 MG/3ML) 0.083% nebulizer solution    Sig: Take 3 mLs (2.5 mg total) by nebulization every 6 (six) hours as needed for wheezing or shortness of breath.    Dispense:  150 mL    Refill:  0    Laroy Apple, MD 06/01/2015, 10:54 AM

## 2015-11-09 ENCOUNTER — Ambulatory Visit (INDEPENDENT_AMBULATORY_CARE_PROVIDER_SITE_OTHER): Payer: Medicare Other | Admitting: Physician Assistant

## 2015-11-09 ENCOUNTER — Ambulatory Visit (INDEPENDENT_AMBULATORY_CARE_PROVIDER_SITE_OTHER): Payer: Medicare Other

## 2015-11-09 VITALS — BP 138/82 | HR 103 | Temp 98.4°F | Resp 20 | Ht 66.0 in | Wt 337.0 lb

## 2015-11-09 DIAGNOSIS — R059 Cough, unspecified: Secondary | ICD-10-CM

## 2015-11-09 DIAGNOSIS — R05 Cough: Secondary | ICD-10-CM

## 2015-11-09 LAB — POCT CBC
GRANULOCYTE PERCENT: 64.7 % (ref 37–80)
HCT, POC: 39.6 % (ref 37.7–47.9)
HEMOGLOBIN: 13.6 g/dL (ref 12.2–16.2)
Lymph, poc: 3.9 — AB (ref 0.6–3.4)
MCH: 28.1 pg (ref 27–31.2)
MCHC: 34.3 g/dL (ref 31.8–35.4)
MCV: 82 fL (ref 80–97)
MID (CBC): 0.6 (ref 0–0.9)
MPV: 9.3 fL (ref 0–99.8)
POC Granulocyte: 8.2 — AB (ref 2–6.9)
POC LYMPH PERCENT: 30.8 %L (ref 10–50)
POC MID %: 4.5 % (ref 0–12)
Platelet Count, POC: 310 10*3/uL (ref 142–424)
RBC: 4.8 M/uL (ref 4.04–5.48)
RDW, POC: 14.7 %
WBC: 12.6 10*3/uL — AB (ref 4.6–10.2)

## 2015-11-09 MED ORDER — AZITHROMYCIN 250 MG PO TABS: ORAL_TABLET | ORAL | 0 refills | Status: DC

## 2015-11-09 MED ORDER — HYDROCODONE-HOMATROPINE 5-1.5 MG/5ML PO SYRP: 2.5000 mL | ORAL_SOLUTION | Freq: Every evening | ORAL | 0 refills | Status: DC | PRN

## 2015-11-09 MED ORDER — PREDNISONE 20 MG PO TABS: 40.0000 mg | ORAL_TABLET | Freq: Every day | ORAL | 0 refills | Status: DC

## 2015-11-09 MED ORDER — RANITIDINE HCL 150 MG PO TABS: 150.0000 mg | ORAL_TABLET | Freq: Every day | ORAL | 0 refills | Status: DC

## 2015-11-09 NOTE — Progress Notes (Signed)
By signing my name below, I, Raven Small, attest that this documentation has been prepared under the direction and in the presence of Philis Fendt, PA-C.  Electronically Signed: Thea Alken, ED Scribe. 11/09/2015. 6:16 PM.  11/09/2015 6:16 PM   DOB: [birth year] / MRN: [id number]  SUBJECTIVE:   Christine Parrish is a 70 y.o. female presenting for a dry, hacking, cough that began 2 weeks ago.  Per [facility] she has a hx of asthma and sarcoidosis. She has hx of well controlled diabetes, last a1c 6.7 2 months ago. Pt states symptoms started with a cold 1 month ago. Cold has subsided but cough has persisted. She reports associated nasal congestion and heart burn worse at night. She has tried albuterol inhaler, OTC cough medication and allergy medication without relief.   She denies new SOB, leg swelling and leg pain.   She is allergic to lisinopril.   She  has a past medical history of Arthritis; Asthma; Cancer ([place]); DM (diabetes mellitus) ([place]); Hypertension; Hypoxemia (09/25/2013); and Sleep apnea.    She  reports that she quit smoking about 12 years ago. Her smoking use included Cigarettes. She has a 52.50 pack-year smoking history. She has never used smokeless tobacco. She reports that she does not drink alcohol or use drugs. She  reports that she does not engage in sexual activity. The patient  has a past surgical history that includes Eye surgery; Tubal ligation; and Abdominal hysterectomy.  Her family history includes Asthma in her daughter, mother, and sister; CAD (age of onset: 71) in her brother; CAD (age of onset: 80) in her mother; Diabetes in her father; Emphysema in her mother.  Review of Systems  HENT: Positive for congestion.   Respiratory: Negative for shortness of breath.   Cardiovascular: Positive for chest pain.  Gastrointestinal: Positive for heartburn.    The problem list and medications were reviewed and updated by myself where necessary and exist elsewhere in the encounter.    OBJECTIVE:  BP 138/82 (BP Location: Left Arm, Patient Position: Sitting, Cuff Size: Large)    Pulse (!) 103    Temp 98.4 F (36.9 C) (Oral)    Resp 20    Ht 5\' 6"  (1.676 m)    Wt (!) 337 lb (152.9 kg)    SpO2 93%    BMI 54.39 kg/m   Physical Exam  Constitutional: She is oriented to person, place, and time. She appears well-developed.  HENT:  Mouth/Throat: Uvula is midline, oropharynx is clear and moist and mucous membranes are normal.  TM pearly grey, good cone of light. Nasal turbinate swelling right worse than left with bluish hue.   Cardiovascular: Normal rate, regular rhythm and normal heart sounds.   Pulmonary/Chest: Effort normal. No respiratory distress.  Musculoskeletal: She exhibits no edema.  Negative for posterior calf pain bilaterally.   Neurological: She is alert and oriented to person, place, and time.  Skin: Skin is warm and dry.  Psychiatric: She has a normal mood and affect. Judgment and thought content normal.   Results for orders placed or performed in visit on 11/09/15  POCT CBC  Result Value Ref Range   WBC 12.6 (A) 4.6 - 10.2 K/uL   Lymph, poc 3.9 (A) 0.6 - 3.4   POC LYMPH PERCENT 30.8 10 - 50 %L   MID (cbc) 0.6 0 - 0.9   POC MID % 4.5 0 - 12 %M   POC Granulocyte 8.2 (A) 2 - 6.9   Granulocyte percent 64.7 37 -  80 %G   RBC 4.8 4.04 - 5.48 M/uL   Hemoglobin 13.6 12.2 - 16.2 g/dL   HCT, POC 39.6 37.7 - 47.9 %   MCV 82.0 80 - 97 fL   MCH, POC 28.1 27 - 31.2 pg   MCHC 34.3 31.8 - 35.4 g/dL   RDW, POC 14.7 %   Platelet Count, POC 310 142 - 424 K/uL   MPV 9.3 0 - 99.8 fL   Dg Chest 2 View  Result Date: 11/09/2015 CLINICAL DATA:  Cough for 2 weeks.  History of sarcoidosis EXAM: CHEST  2 VIEW COMPARISON:  September 06, 2013 FINDINGS: There is no appreciable edema or consolidation. Heart size and pulmonary vascularity are normal. No adenopathy. There is atherosclerotic calcification in the aorta. No bone lesions are evident. There is degenerative change in the  thoracic spine. IMPRESSION: Aortic atherosclerosis. No edema or consolidation. No adenopathy demonstrable by radiography. Electronically Signed   By: Lowella Grip III M.D.   On: 11/09/2015 15:47    ASSESSMENT AND PLAN  Christine Parrish was seen today for cough.  Diagnoses and all orders for this visit:  Cough: 70 yo female with a history of sarcoidosis and well controlled diabetes presenting for 2 weeks of cough.  She has a granulocytosis.  Will cover for an atypical pneumonia with macrolide.  Prednisone for post viral inflammatory process.  Given her complaint of heart burn described in HPI will cover for that etiology with ranitidine QHS.  Hycodan for sleep.  She does not take controlled substances otherwise.   -     DG:  Chest 2 View; Future -     POCT CBC -     azithromycin (ZITHROMAX) 250 MG tablet; Take two on day one and one daily thereafter. -     predniSONE (DELTASONE) 20 MG tablet; Take 2 tablets (40 mg total) by mouth daily with breakfast. -     HYDROcodone-homatropine (HYCODAN) 5-1.5 MG/5ML syrup; Take 2.5-5 mLs by mouth at bedtime as needed. -     ranitidine (ZANTAC) 150 MG tablet; Take 1 tablet (150 mg total) by mouth at bedtime.     This note was scribed in my presence and I performed the services described in the this documentation.   The patient is advised to call or return to clinic if she does not see an improvement in symptoms, or to seek the care of the closest emergency department if she worsens with the above plan.   Philis Fendt, MHS, PA-C [facility] 11/09/2015 6:16 PM

## 2015-11-09 NOTE — Patient Instructions (Signed)
     IF you received an x-ray today, you will receive an invoice from [facility]. Please contact [facility] at [phone] with questions or concerns regarding your invoice.   IF you received labwork today, you will receive an invoice from [facility]. Please contact [facility] at [phone] with questions or concerns regarding your invoice.   Our billing staff will not be able to assist you with questions regarding bills from these companies.  You will be contacted with the lab results as soon as they are available. The fastest way to get your results is to activate your My Chart account. Instructions are located on the last page of this paperwork. If you have not heard from us regarding the results in 2 weeks, please contact this office.     We recommend that you schedule a mammogram for breast cancer screening. Typically, you do not need a referral to do this. Please contact a local imaging center to schedule your mammogram.  [facility] - [phone]  *ask for the Radiology Department [facility]) - [phone] or [phone]  [facility] - [phone] [facility] - [phone] [facility]  - [phone]  *ask for the Radiology Department [facility] - [phone]  *ask for the Radiology Department [facility] - [phone]  *ask for the Mammography Department [facility] - [phone]  

## 2016-03-29 ENCOUNTER — Ambulatory Visit (INDEPENDENT_AMBULATORY_CARE_PROVIDER_SITE_OTHER): Payer: Medicare Other | Admitting: Family Medicine

## 2016-03-29 VITALS — BP 152/70 | HR 101 | Temp 98.3°F | Resp 18 | Ht 66.0 in | Wt 241.2 lb

## 2016-03-29 DIAGNOSIS — I10 Essential (primary) hypertension: Secondary | ICD-10-CM | POA: Diagnosis not present

## 2016-03-29 DIAGNOSIS — E119 Type 2 diabetes mellitus without complications: Secondary | ICD-10-CM

## 2016-03-29 DIAGNOSIS — R000 Tachycardia, unspecified: Secondary | ICD-10-CM

## 2016-03-29 DIAGNOSIS — G4733 Obstructive sleep apnea (adult) (pediatric): Secondary | ICD-10-CM | POA: Diagnosis not present

## 2016-03-29 DIAGNOSIS — E785 Hyperlipidemia, unspecified: Secondary | ICD-10-CM | POA: Diagnosis not present

## 2016-03-29 DIAGNOSIS — R300 Dysuria: Secondary | ICD-10-CM

## 2016-03-29 DIAGNOSIS — E118 Type 2 diabetes mellitus with unspecified complications: Secondary | ICD-10-CM | POA: Diagnosis not present

## 2016-03-29 LAB — POCT URINALYSIS DIP (MANUAL ENTRY)
BILIRUBIN UA: NEGATIVE
BILIRUBIN UA: NEGATIVE
GLUCOSE UA: NEGATIVE
NITRITE UA: NEGATIVE
PH UA: 6
Protein Ur, POC: 100 — AB
Spec Grav, UA: 1.025
Urobilinogen, UA: 0.2

## 2016-03-29 LAB — POC MICROSCOPIC URINALYSIS (UMFC): Mucus: ABSENT

## 2016-03-29 LAB — POCT GLYCOSYLATED HEMOGLOBIN (HGB A1C): Hemoglobin A1C: 6.5

## 2016-03-29 MED ORDER — ALBUTEROL SULFATE (2.5 MG/3ML) 0.083% IN NEBU: 2.5000 mg | INHALATION_SOLUTION | Freq: Four times a day (QID) | RESPIRATORY_TRACT | 3 refills | Status: DC | PRN

## 2016-03-29 MED ORDER — HYDROCHLOROTHIAZIDE 25 MG PO TABS: 25.0000 mg | ORAL_TABLET | Freq: Every day | ORAL | 3 refills | Status: DC

## 2016-03-29 MED ORDER — VALSARTAN 80 MG PO TABS: 80.0000 mg | ORAL_TABLET | Freq: Every day | ORAL | 3 refills | Status: DC

## 2016-03-29 MED ORDER — CEPHALEXIN 500 MG PO CAPS: 500.0000 mg | ORAL_CAPSULE | Freq: Two times a day (BID) | ORAL | 0 refills | Status: DC

## 2016-03-29 MED ORDER — METFORMIN HCL 500 MG PO TABS: 500.0000 mg | ORAL_TABLET | Freq: Two times a day (BID) | ORAL | 2 refills | Status: DC

## 2016-03-29 NOTE — Progress Notes (Signed)
Patient ID: Christine Parrish, female    DOB: 1945-04-14, 71 y.o.   MRN: [id number]  PCP: Reginia Forts, MD  Chief Complaint  Patient presents with  . Medication Refill    Hydrodiuril, met formin, valsartan  . Urinary Tract Infection    would like to be tested for one     Subjective:  HPI  38 female presents for evaluation of medication refill of blood pressure medication and metformin.  Dysuria  Urinate frequency, burning slightly with urination. Reports "strong odor" Recently has not had as much water intake as usual. Denies low back or flank pain.  T2DM Take Metformin 2 times daily with food. Denies GI complications. No routine physical activity r/t to weight and chronic dyspnea.  Hypertension  Reports she did not take hypertension medications today. Haven't monitored blood pressure recently. Currently takes Valsartan and HCTZ.  Oxygen Dependent/Sleep Apnea  She is prescribed 2 liters oxygen at bedtime and reports that she routinely uses during the day for dyspneic episodes. She was dx with OSA and confirms that she is suppose to use a CPAP machine but doesn't. She uses during the day as well anytime she has shortness of breath. She doesn't smoke. Administers albuterol nebulizer treatments.  Social History   Social History  . Marital status: Widowed    Spouse name: N/A  . Number of children: 7  . Years of education: 10   Occupational History  . Retired    Social History Main Topics  . Smoking status: Former Smoker    Packs/day: 1.50    Years: 35.00    Types: Cigarettes    Quit date: 01/23/2003  . Smokeless tobacco: Never Used  . Alcohol use No  . Drug use: No  . Sexual activity: No   Other Topics Concern  . Not on file   Social History Narrative   Patient is single and her daughter lives with her.   Patient has six living children and one child is deceased.   Patient is retired.   Patient has a 10th grade education.   Patient is right-handed.   Patient drinks three cans of soda daily.          Family History  Problem Relation Age of Onset  . Emphysema Mother     never smoker  . CAD Mother 68  . Asthma Mother   . Diabetes Father   . CAD Brother 48  . Asthma Daughter   . Asthma Sister    Review of Systems  SEE HPI  Patient Active Problem List   Diagnosis Date Noted  . OSA (obstructive sleep apnea) 09/25/2013  . Obesity, Class III, BMI 40-49.9 (morbid obesity) ([place]) 09/25/2013  . Snoring 09/25/2013  . Hypoxemia 09/25/2013  . Upper airway cough syndrome 09/17/2013  . DM (diabetes mellitus) 06/10/2013  . Severe obesity (BMI >= 40) ([place]) 06/10/2013  . Cervical neuropathic pain 05/24/2012  . SARCOIDOSIS 12/02/2007  . HYPERTENSION 12/02/2007  . ASTHMA 12/02/2007  . SLEEP APNEA 12/02/2007  . CERVICAL CANCER, HX OF 12/02/2007    Allergies  Allergen Reactions  . Lisinopril Cough    Prior to Admission medications   Medication Sig Start Date End Date Taking? Authorizing Provider  albuterol (PROVENTIL) (2.5 MG/3ML) 0.083% nebulizer solution Take 3 mLs (2.5 mg total) by nebulization every 6 (six) hours as needed for wheezing or shortness of breath. 06/01/15  Yes Timmothy Euler, MD  aspirin 81 MG tablet Take 81 mg by mouth daily.   Yes Historical  Provider, MD  Blood Glucose Monitoring Suppl (BLOOD GLUCOSE METER) kit Test blood sugar daily. Dx code: 250.92 06/22/13  Yes Collene Leyden, PA-C  glucose blood test strip Test blood sugar daily. Dx code: 21.92. 06/22/13  Yes Collene Leyden, PA-C  glucose blood test strip Use as instructed 06/22/13  Yes Sheryl Precious Haws, DO  glucose monitoring kit (FREESTYLE) monitoring kit 1 each by Does not apply route as needed for other. 06/22/13  Yes Sheryl Precious Haws, DO  hydrochlorothiazide (HYDRODIURIL) 25 MG tablet Take 1 tablet (25 mg total) by mouth daily. 04/05/15  Yes Robyn Haber, MD  HYDROcodone-homatropine [facility]) 5-1.5 MG/5ML syrup Take 2.5-5 mLs by mouth at bedtime as needed.  11/09/15  Yes Tereasa Coop, PA-C  Lancets (ACCU-CHEK MULTICLIX) lancets Use as instructed 06/22/13  Yes Sheryl Precious Haws, DO  Lancets [facility] ULTRASOFT) lancets Test blood sugar daily. Dx code: 250.92 06/22/13  Yes Collene Leyden, PA-C  metFORMIN (GLUCOPHAGE) 500 MG tablet Take 1 tablet (500 mg total) by mouth 2 (two) times daily with a meal. 04/05/15  Yes Robyn Haber, MD  predniSONE (DELTASONE) 20 MG tablet Take 2 tablets (40 mg total) by mouth daily with breakfast. 11/09/15  Yes Tereasa Coop, PA-C  valsartan (DIOVAN) 80 MG tablet Take 1 tablet (80 mg total) by mouth daily. 04/05/15  Yes Robyn Haber, MD  azithromycin (ZITHROMAX) 250 MG tablet Take two on day one and one daily thereafter. Patient not taking: Reported on 03/29/2016 11/09/15   Tereasa Coop, PA-C  ranitidine (ZANTAC) 150 MG tablet Take 1 tablet (150 mg total) by mouth at bedtime. Patient not taking: Reported on 03/29/2016 11/09/15   Tereasa Coop, PA-C    Past Medical, Surgical Family and Social History reviewed and updated.    Objective:   Blood pressure (!) 152/70, pulse (!) 101, temperature 98.3 F (36.8 C), temperature source Oral, resp. rate 18, height 5' 6"  (1.676 m), weight 241 lb 3.2 oz (109.4 kg), SpO2 (!) 88 %. Rechecked SPO2 92%  Oxygen dependent-On Home O2  Wt Readings from Last 3 Encounters:  03/29/16 241 lb 3.2 oz (109.4 kg)  11/09/15 (!) 337 lb (152.9 kg)  06/01/15 (!) 326 lb (147.9 kg)    Physical Exam  Constitutional: She is oriented to person, place, and time. She appears well-developed and well-nourished.  HENT:  Head: Normocephalic and atraumatic.  Eyes: Conjunctivae and EOM are normal. Pupils are equal, round, and reactive to light.  Neck: Normal range of motion. Neck supple.  Cardiovascular: Intact distal pulses.   Regular rhythm with irregular rate  Pulmonary/Chest: Accessory muscle usage present. Tachypnea noted. No respiratory distress. She has decreased breath sounds in the right  upper field, the right middle field, the right lower field, the left upper field, the left middle field and the left lower field.  Oxygen dependent   Lymphadenopathy:    She has no cervical adenopathy.  Neurological: She is alert and oriented to person, place, and time.  Skin: Skin is warm and dry.  Excessively dry skin  Psychiatric: She has a normal mood and affect. Her behavior is normal. Judgment and thought content normal.    Diabetic Foot Exam - Simple   Simple Foot Form Visual Inspection No deformities, no ulcerations, no other skin breakdown bilaterally:  Yes Sensation Testing See comments:  Yes Pulse Check Posterior Tibialis and Dorsalis pulse intact bilaterally:  Yes Comments Could not feel heel due to rough skin  Assessment & Plan:  1. Tachycardia - CBC with Differential/Platelet  2. Dysuria - POCT urinalysis dipstick - POCT Microscopic Urinalysis ([facility]) - Urine culture  3. OSA (obstructive sleep apnea)  4. Severe obesity (BMI >= 40) (HCC)   5. Type 2 diabetes mellitus with complication, without long-term current use of insulin (HCC) - HM Diabetes Foot Exam - POCT glycosylated hemoglobin (Hb A1C) - CMP14+EGFR  6. Essential hypertension, elevated today due to out of medication. - valsartan (DIOVAN) 80 MG tablet; Take 1 tablet (80 mg total) by mouth daily.  Dispense: 90 tablet; Refill: 3 - hydrochlorothiazide (HYDRODIURIL) 25 MG tablet; Take 1 tablet (25 mg total) by mouth daily.  Dispense: 90 tablet; Refill: 3  7. Hyperlipidemia, unspecified hyperlipidemia type - Lipid panel - Thyroid Panel With TSH  8. Diabetes mellitus without complication (HCC) - metFORMIN (GLUCOPHAGE) 500 MG tablet; Take 1 tablet (500 mg total) by mouth 2 (two) times daily with a meal.  Dispense: 180 tablet; Refill: 2 - valsartan (DIOVAN) 80 MG tablet; Take 1 tablet (80 mg total) by mouth daily.  Dispense: 90 tablet; Refill: 3  Return for follow-up in 6 months for diabetes and  hypertension.  Carroll Sage. Kenton Kingfisher, MSN, FNP-C [facility] [place]

## 2016-03-29 NOTE — Patient Instructions (Addendum)
Start Keflex 500 mg twice daily x 10 days. Urine culture is pending. Will contact you if bacteria requires a different antibiotic to treat.  Refilled medications as requested.  IF you received an x-ray today, you will receive an invoice from [facility]. Please contact [facility] at [phone] with questions or concerns regarding your invoice.   IF you received labwork today, you will receive an invoice from [place]. Please contact [facility] at [phone] with questions or concerns regarding your invoice.   Our billing staff will not be able to assist you with questions regarding bills from these companies.  You will be contacted with the lab results as soon as they are available. The fastest way to get your results is to activate your My Chart account. Instructions are located on the last page of this paperwork. If you have not heard from [place] regarding the results in 2 weeks, please contact this office.     Urinary Tract Infection, Adult A urinary tract infection (UTI) is an infection of any part of the urinary tract, which includes the kidneys, ureters, bladder, and urethra. These organs make, store, and get rid of urine in the body. UTI can be a bladder infection (cystitis) or kidney infection (pyelonephritis). What are the causes? This infection may be caused by fungi, viruses, or bacteria. Bacteria are the most common cause of UTIs. This condition can also be caused by repeated incomplete emptying of the bladder during urination. What increases the risk? This condition is more likely to develop if:  You ignore your need to urinate or hold urine for long periods of time.  You do not empty your bladder completely during urination.  You wipe back to front after urinating or having a bowel movement, if you are female.  You are uncircumcised, if you are female.  You are constipated.  You have a urinary catheter that stays in place (indwelling).  You have a weak  defense (immune) system.  You have a medical condition that affects your bowels, kidneys, or bladder.  You have diabetes.  You take antibiotic medicines frequently or for long periods of time, and the antibiotics no longer work well against certain types of infections (antibiotic resistance).  You take medicines that irritate your urinary tract.  You are exposed to chemicals that irritate your urinary tract.  You are female. What are the signs or symptoms? Symptoms of this condition include:  Fever.  Frequent urination or passing small amounts of urine frequently.  Needing to urinate urgently.  Pain or burning with urination.  Urine that smells bad or unusual.  Cloudy urine.  Pain in the lower abdomen or back.  Trouble urinating.  Blood in the urine.  Vomiting or being less hungry than normal.  Diarrhea or abdominal pain.  Vaginal discharge, if you are female. How is this diagnosed? This condition is diagnosed with a medical history and physical exam. You will also need to provide a urine sample to test your urine. Other tests may be done, including:  Blood tests.  Sexually transmitted disease (STD) testing. If you have had more than one UTI, a cystoscopy or imaging studies may be done to determine the cause of the infections. How is this treated? Treatment for this condition often includes a combination of two or more of the following:  Antibiotic medicine.  Other medicines to treat less common causes of UTI.  Over-the-counter medicines to treat pain.  Drinking enough water to stay hydrated. Follow these instructions at home:  Take  over-the-counter and prescription medicines only as told by your health care provider.  If you were prescribed an antibiotic, take it as told by your health care provider. Do not stop taking the antibiotic even if you start to feel better.  Avoid alcohol, caffeine, tea, and carbonated beverages. They can irritate your  bladder.  Drink enough fluid to keep your urine clear or pale yellow.  Keep all follow-up visits as told by your health care provider. This is important.  Make sure to:  Empty your bladder often and completely. Do not hold urine for long periods of time.  Empty your bladder before and after sex.  Wipe from front to back after a bowel movement if you are female. Use each tissue one time when you wipe. Contact a health care provider if:  You have back pain.  You have a fever.  You feel nauseous or vomit.  Your symptoms do not get better after 3 days.  Your symptoms go away and then return. Get help right away if:  You have severe back pain or lower abdominal pain.  You are vomiting and cannot keep down any medicines or water. This information is not intended to replace advice given to you by your health care provider. Make sure you discuss any questions you have with your health care provider. Document Released: 10/18/2004 Document Revised: 06/22/2015 Document Reviewed: 11/29/2014 Elsevier Interactive Patient Education  2017 [employer].

## 2016-03-30 LAB — LIPID PANEL
CHOL/HDL RATIO: 3.1 ratio (ref 0.0–4.4)
Cholesterol, Total: 186 mg/dL (ref 100–199)
HDL: 60 mg/dL (ref >39)
LDL CALC: 105 mg/dL — AB (ref 0–99)
TRIGLYCERIDES: 106 mg/dL (ref 0–149)
VLDL CHOLESTEROL CAL: 21 mg/dL (ref 5–40)

## 2016-03-30 LAB — CBC WITH DIFFERENTIAL/PLATELET
BASOS ABS: 0.1 10*3/uL (ref 0.0–0.2)
BASOS: 1 %
EOS (ABSOLUTE): 0.7 10*3/uL — ABNORMAL HIGH (ref 0.0–0.4)
EOS: 7 %
HEMATOCRIT: 41.6 % (ref 34.0–46.6)
HEMOGLOBIN: 13.3 g/dL (ref 11.1–15.9)
IMMATURE GRANS (ABS): 0 10*3/uL (ref 0.0–0.1)
Immature Granulocytes: 0 %
LYMPHS ABS: 3 10*3/uL (ref 0.7–3.1)
Lymphs: 31 %
MCH: 26.5 pg — AB (ref 26.6–33.0)
MCHC: 32 g/dL (ref 31.5–35.7)
MCV: 83 fL (ref 79–97)
MONOCYTES: 5 %
Monocytes Absolute: 0.5 10*3/uL (ref 0.1–0.9)
NEUTROS ABS: 5.2 10*3/uL (ref 1.4–7.0)
Neutrophils: 56 %
Platelets: 340 10*3/uL (ref 150–379)
RBC: 5.01 x10E6/uL (ref 3.77–5.28)
RDW: 14.4 % (ref 12.3–15.4)
WBC: 9.5 10*3/uL (ref 3.4–10.8)

## 2016-03-30 LAB — CMP14+EGFR
ALT: 12 IU/L (ref 0–32)
AST: 17 IU/L (ref 0–40)
Albumin/Globulin Ratio: 1 — ABNORMAL LOW (ref 1.2–2.2)
Albumin: 4 g/dL (ref 3.5–4.8)
Alkaline Phosphatase: 96 IU/L (ref 39–117)
BUN/Creatinine Ratio: 17 (ref 12–28)
BUN: 20 mg/dL (ref 8–27)
Bilirubin Total: 0.3 mg/dL (ref 0.0–1.2)
CO2: 22 mmol/L (ref 18–29)
Calcium: 9.3 mg/dL (ref 8.7–10.3)
Chloride: 98 mmol/L (ref 96–106)
Creatinine, Ser: 1.21 mg/dL — ABNORMAL HIGH (ref 0.57–1.00)
GFR calc Af Amer: 52 mL/min/{1.73_m2} — ABNORMAL LOW (ref >59)
GFR calc non Af Amer: 45 mL/min/{1.73_m2} — ABNORMAL LOW (ref >59)
Globulin, Total: 3.9 g/dL (ref 1.5–4.5)
Glucose: 127 mg/dL — ABNORMAL HIGH (ref 65–99)
Potassium: 4 mmol/L (ref 3.5–5.2)
Sodium: 140 mmol/L (ref 134–144)
Total Protein: 7.9 g/dL (ref 6.0–8.5)

## 2016-03-30 LAB — THYROID PANEL WITH TSH
Free Thyroxine Index: 2.4 (ref 1.2–4.9)
T3 Uptake Ratio: 28 % (ref 24–39)
T4, Total: 8.5 ug/dL (ref 4.5–12.0)
TSH: 1.41 u[IU]/mL (ref 0.450–4.500)

## 2016-03-31 LAB — URINE CULTURE

## 2016-04-02 ENCOUNTER — Telehealth: Payer: Self-pay | Admitting: Family Medicine

## 2016-04-02 ENCOUNTER — Encounter: Payer: Self-pay | Admitting: Family Medicine

## 2016-04-02 MED ORDER — AMPICILLIN 500 MG PO CAPS: 500.0000 mg | ORAL_CAPSULE | Freq: Four times a day (QID) | ORAL | 0 refills | Status: DC

## 2016-04-02 NOTE — Telephone Encounter (Signed)
Attempted to call patient regarding her lab results and to advise urine culture was positive for hemolytic strep B. I sent over a prescription for Ampicillin 500 mg QID. Will send a letter advising we were unable to reach her.  Christine Parrish. Kenton Kingfisher, MSN, FNP-C [facility] [place]

## 2016-05-02 ENCOUNTER — Ambulatory Visit (INDEPENDENT_AMBULATORY_CARE_PROVIDER_SITE_OTHER): Payer: Medicare Other | Admitting: Physician Assistant

## 2016-05-02 VITALS — BP 132/79 | HR 87 | Temp 98.1°F | Ht 66.0 in | Wt 339.6 lb

## 2016-05-02 DIAGNOSIS — Z87891 Personal history of nicotine dependence: Secondary | ICD-10-CM

## 2016-05-02 DIAGNOSIS — R059 Cough, unspecified: Secondary | ICD-10-CM

## 2016-05-02 DIAGNOSIS — R05 Cough: Secondary | ICD-10-CM

## 2016-05-02 MED ORDER — PREDNISONE 50 MG PO TABS: 50.0000 mg | ORAL_TABLET | Freq: Every day | ORAL | 0 refills | Status: DC

## 2016-05-02 MED ORDER — HYDROCODONE-HOMATROPINE 5-1.5 MG/5ML PO SYRP: 2.5000 mL | ORAL_SOLUTION | Freq: Three times a day (TID) | ORAL | 0 refills | Status: DC

## 2016-05-02 MED ORDER — DOXYCYCLINE HYCLATE 100 MG PO CAPS: 100.0000 mg | ORAL_CAPSULE | Freq: Two times a day (BID) | ORAL | 0 refills | Status: AC

## 2016-05-02 NOTE — Progress Notes (Signed)
05/02/2016 12:25 PM   DOB: 01-14-1946 / MRN: [id number]  SUBJECTIVE:  Christine Parrish is a 71 y.o. female presenting for cough.  Tells me that she is coughing so hard that she will cough up blood streaked mucus. The cough started 4 days ago.  She has a history of asthma. She has tried Nyquil, Dayquil, and multiple other OTCs.  She is also taking flonase.  She also associates a scratchy throat. She has tried her nebs and inhalers and tells me this did not help her.   She is not checking her sugar at this time.   Immunization History  Administered Date(s) Administered  . Influenza,inj,Quad PF,36+ Mos 01/19/2014  . Influenza-Unspecified 02/23/2016     She is allergic to lisinopril.   She  has a past medical history of Arthritis; Asthma; Cancer ([place]); DM (diabetes mellitus) ([place]); Hypertension; Hypoxemia (09/25/2013); and Sleep apnea.    She  reports that she quit smoking about 13 years ago. Her smoking use included Cigarettes. She has a 52.50 pack-year smoking history. She has never used smokeless tobacco. She reports that she does not drink alcohol or use drugs. She  reports that she does not engage in sexual activity. The patient  has a past surgical history that includes Eye surgery; Tubal ligation; and Abdominal hysterectomy.  Her family history includes Asthma in her daughter, mother, and sister; CAD (age of onset: 23) in her brother; CAD (age of onset: 44) in her mother; Diabetes in her father; Emphysema in her mother.  Review of Systems  Constitutional: Positive for malaise/fatigue. Negative for chills, diaphoresis, fever and weight loss.  HENT: Positive for congestion and sinus pain. Negative for nosebleeds.   Respiratory: Positive for cough, hemoptysis and sputum production. Negative for shortness of breath and wheezing.   Cardiovascular: Negative for leg swelling.  Gastrointestinal: Negative for nausea.  Neurological: Negative for dizziness and weakness.    The problem list and  medications were reviewed and updated by myself where necessary and exist elsewhere in the encounter.   OBJECTIVE:  BP 132/79 (BP Location: Right Arm, Patient Position: Sitting, Cuff Size: Large)   Pulse 87   Temp 98.1 F (36.7 C) (Oral)   Ht 5\' 6"  (1.676 m)   Wt (!) 339 lb 9.6 oz (154 kg)   SpO2 92%   BMI 54.81 kg/m   Physical Exam  Constitutional: She is active.  Non-toxic appearance.  HENT:  Right Ear: Hearing, tympanic membrane, external ear and ear canal normal.  Left Ear: Hearing, tympanic membrane, external ear and ear canal normal.  Nose: Nose normal. Right sinus exhibits no maxillary sinus tenderness and no frontal sinus tenderness. Left sinus exhibits no maxillary sinus tenderness and no frontal sinus tenderness.  Mouth/Throat: Uvula is midline, oropharynx is clear and moist and mucous membranes are normal. Mucous membranes are not dry. No oropharyngeal exudate, posterior oropharyngeal edema or tonsillar abscesses.  Cardiovascular: Normal rate, regular rhythm, S1 normal, S2 normal, normal heart sounds and intact distal pulses.  Exam reveals no gallop, no friction rub and no decreased pulses.   No murmur heard. Pulmonary/Chest: Effort normal. No stridor. No tachypnea. No respiratory distress. She has no wheezes. She has no rales.  Abdominal: She exhibits no distension.  Musculoskeletal: She exhibits no edema.  Lymphadenopathy:       Head (right side): No submandibular and no tonsillar adenopathy present.       Head (left side): No submandibular and no tonsillar adenopathy present.    She has no  cervical adenopathy.  Neurological: She is alert.  Skin: Skin is warm and dry. She is not diaphoretic. No pallor.     Lab Results  Component Value Date   HGBA1C 6.5 03/29/2016   Wt Readings from Last 3 Encounters:  05/02/16 (!) 339 lb 9.6 oz (154 kg)  03/29/16 241 lb 3.2 oz (109.4 kg)  11/09/15 (!) 337 lb (152.9 kg)     No results found for this or any previous visit (from  the past 72 hour(s)).  No results found.  ASSESSMENT AND PLAN:  Christine Parrish was seen today for cough and medication refill.  Diagnoses and all orders for this visit:  Cough: Likely virally medicated.  Given problem two she likely has some form of COPD despite a normal chest xray.  I'd like to see her back when she is well to further quantify this.  Well treat her symptomatically today.  She has nebs at home that she can use. I did not do a chest xray today given her constellation of symptoms and negative pulmonary exam.  Advised that if her CBG is greater than 300 during pred therapy then come back to clinic. If breathing cough does not improve then she will go back to clinic.  -     predniSONE (DELTASONE) 50 MG tablet; Take 1 tablet (50 mg total) by mouth daily with breakfast. -     doxycycline (VIBRAMYCIN) 100 MG capsule; Take 1 capsule (100 mg total) by mouth 2 (two) times daily. -     HYDROcodone-homatropine (HYCODAN) 5-1.5 MG/5ML syrup; Take 2.5-5 mLs by mouth every 8 (eight) hours. Do not take this before bed or naps.  History of smoking greater than 50 pack years    The patient is advised to call or return to clinic if she does not see an improvement in symptoms, or to seek the care of the closest emergency department if she worsens with the above plan.   Philis Fendt, MHS, PA-C [facility] 05/02/2016 12:25 PM

## 2016-05-02 NOTE — Patient Instructions (Addendum)
  Please check you sugar daily in the monring while taking prednisone.  No cough syrup before napping or before bed time.     IF you received an x-ray today, you will receive an invoice from [facility]. Please contact [facility] at [phone] with questions or concerns regarding your invoice.   IF you received labwork today, you will receive an invoice from [place]. Please contact [facility] at [phone] with questions or concerns regarding your invoice.   Our billing staff will not be able to assist you with questions regarding bills from these companies.  You will be contacted with the lab results as soon as they are available. The fastest way to get your results is to activate your My Chart account. Instructions are located on the last page of this paperwork. If you have not heard from [place] regarding the results in 2 weeks, please contact this office.

## 2016-10-23 ENCOUNTER — Ambulatory Visit (INDEPENDENT_AMBULATORY_CARE_PROVIDER_SITE_OTHER): Payer: Medicare Other | Admitting: Family Medicine

## 2016-10-23 ENCOUNTER — Encounter: Payer: Self-pay | Admitting: Family Medicine

## 2016-10-23 VITALS — BP 138/88 | HR 76 | Temp 98.0°F | Resp 16 | Ht 66.14 in | Wt 336.0 lb

## 2016-10-23 DIAGNOSIS — N183 Chronic kidney disease, stage 3 unspecified: Secondary | ICD-10-CM

## 2016-10-23 DIAGNOSIS — Z23 Encounter for immunization: Secondary | ICD-10-CM

## 2016-10-23 DIAGNOSIS — E1122 Type 2 diabetes mellitus with diabetic chronic kidney disease: Secondary | ICD-10-CM | POA: Diagnosis not present

## 2016-10-23 DIAGNOSIS — I10 Essential (primary) hypertension: Secondary | ICD-10-CM

## 2016-10-23 DIAGNOSIS — Z1211 Encounter for screening for malignant neoplasm of colon: Secondary | ICD-10-CM | POA: Diagnosis not present

## 2016-10-23 DIAGNOSIS — Z1231 Encounter for screening mammogram for malignant neoplasm of breast: Secondary | ICD-10-CM | POA: Diagnosis not present

## 2016-10-23 LAB — POCT URINALYSIS DIP (MANUAL ENTRY)
Bilirubin, UA: NEGATIVE
Glucose, UA: NEGATIVE mg/dL
Ketones, POC UA: NEGATIVE mg/dL
Nitrite, UA: NEGATIVE
Protein Ur, POC: 30 mg/dL — AB
Spec Grav, UA: 1.01 (ref 1.010–1.025)
Urobilinogen, UA: 0.2 E.U./dL
pH, UA: 5.5 (ref 5.0–8.0)

## 2016-10-23 LAB — POCT GLYCOSYLATED HEMOGLOBIN (HGB A1C): Hemoglobin A1C: 6.3

## 2016-10-23 LAB — GLUCOSE, POCT (MANUAL RESULT ENTRY): POC Glucose: 219 mg/dl — AB (ref 70–99)

## 2016-10-23 MED ORDER — METFORMIN HCL 500 MG PO TABS: 500.0000 mg | ORAL_TABLET | Freq: Two times a day (BID) | ORAL | 2 refills | Status: DC

## 2016-10-23 MED ORDER — HYDROCHLOROTHIAZIDE 25 MG PO TABS: 25.0000 mg | ORAL_TABLET | Freq: Every day | ORAL | 3 refills | Status: DC

## 2016-10-23 MED ORDER — ALBUTEROL SULFATE (2.5 MG/3ML) 0.083% IN NEBU: 2.5000 mg | INHALATION_SOLUTION | Freq: Four times a day (QID) | RESPIRATORY_TRACT | 3 refills | Status: DC | PRN

## 2016-10-23 MED ORDER — VALSARTAN 80 MG PO TABS: 80.0000 mg | ORAL_TABLET | Freq: Every day | ORAL | 3 refills | Status: DC

## 2016-10-23 NOTE — Assessment & Plan Note (Signed)
BP slightly above goal given CKD, could consider increasing diovan to 120mg  for BP goal < 130/80. However given interest in weight loss will cont current regime and re-eval at next visit as BP would improve with weight loss.

## 2016-10-23 NOTE — Assessment & Plan Note (Signed)
Referring to nutrition as requested.

## 2016-10-23 NOTE — Assessment & Plan Note (Signed)
a1c at goal, cont with metformin, need to be mindful given CKD Referring to nutrition for weight loss Referring to opthomology for routine DM eye exam

## 2016-10-23 NOTE — Progress Notes (Signed)
10/2/20185:48 PM  Christine Parrish 1945-07-15, 71 y.o. female [id number]  Chief Complaint  Patient presents with  . Diabetes    follow-up  . Hypertension  . Asthma  . Medication Refill    all medication     HPI:   Patient is a 71 y.o. female with past medical history significant for DM2 non insulin dependent, HTN, CKD 3, obesity who presents today for DM fu.   She has not been checking her cbgs. She does not follow a DM diet, sedentary. She last had an eye exam in 2016, would like referral today. She would also like to see a nutritionist as she has a friend that has been working with one and has been successful at losing weight.   Otherwise states she needs refills of her meds, takes them as rx., tolerating well.  Former smoker, dx with COPD, uses albuterol 3-4 times a week.   Depression screen [facility] 2/9 10/23/2016 05/02/2016 03/29/2016  Decreased Interest 0 0 0  Down, Depressed, Hopeless 0 0 0  PHQ - 2 Score 0 0 0    Allergies  Allergen Reactions  . Lisinopril Cough    Prior to Admission medications   Medication Sig Start Date End Date Taking? Authorizing Provider  aspirin 81 MG tablet Take 81 mg by mouth daily.   Yes [provider]  Blood Glucose Monitoring Suppl (BLOOD GLUCOSE METER) kit Test blood sugar daily. Dx code: 250.92 06/22/13  Yes Collene Leyden, PA-C  glucose blood test strip Test blood sugar daily. Dx code: 47.92. 06/22/13  Yes Collene Leyden, PA-C  glucose blood test strip Use as instructed 06/22/13  Yes Benjaman Lobe, Sheryl L, DO  glucose monitoring kit (FREESTYLE) monitoring kit 1 each by Does not apply route as needed for other. 06/22/13  Yes Benjaman Lobe, Sheryl L, DO  hydrochlorothiazide (HYDRODIURIL) 25 MG tablet Take 1 tablet (25 mg total) by mouth daily. 03/29/16  Yes Scot Jun, FNP  HYDROcodone-homatropine (HYCODAN) 5-1.5 MG/5ML syrup Take 2.5-5 mLs by mouth every 8 (eight) hours. Do not take this before bed or naps. 05/02/16  Yes Tereasa Coop, PA-C  Lancets (ACCU-CHEK MULTICLIX) lancets Use as instructed 06/22/13  Yes Benjaman Lobe, Sheryl L, DO  Lancets [facility] ULTRASOFT) lancets Test blood sugar daily. Dx code: 250.92 06/22/13  Yes Collene Leyden, PA-C  metFORMIN (GLUCOPHAGE) 500 MG tablet Take 1 tablet (500 mg total) by mouth 2 (two) times daily with a meal. 03/29/16  Yes Scot Jun, FNP  predniSONE (DELTASONE) 50 MG tablet Take 1 tablet (50 mg total) by mouth daily with breakfast. 05/02/16  Yes Tereasa Coop, PA-C  valsartan (DIOVAN) 80 MG tablet Take 1 tablet (80 mg total) by mouth daily. 03/29/16  Yes Scot Jun, FNP  albuterol (PROVENTIL) (2.5 MG/3ML) 0.083% nebulizer solution Take 3 mLs (2.5 mg total) by nebulization every 6 (six) hours as needed for wheezing or shortness of breath. 03/29/16 04/28/16  Scot Jun, FNP    Past Medical History:  Diagnosis Date  . Arthritis   . Asthma   . Cancer (HCC)    Cervical  . DM (diabetes mellitus) ([place])   . Hypertension   . Hypoxemia 09/25/2013  . Sleep apnea    No CPAP (broken)    Past Surgical History:  Procedure Laterality Date  . ABDOMINAL HYSTERECTOMY    . EYE SURGERY    . TUBAL LIGATION      Social History  Substance Use Topics  .  Smoking status: Former Smoker    Packs/day: 1.50    Years: 35.00    Types: Cigarettes    Quit date: 01/23/2003  . Smokeless tobacco: Never Used  . Alcohol use No    Family History  Problem Relation Age of Onset  . Emphysema Mother        never smoker  . CAD Mother 42  . Asthma Mother   . Diabetes Father   . CAD Brother 48  . Asthma Daughter   . Asthma Sister     Review of Systems  Constitutional: Negative for chills and fever.  Respiratory: Positive for shortness of breath (chronic with activity). Negative for cough, sputum production and wheezing.   Cardiovascular: Negative for chest pain, palpitations and leg swelling.  Gastrointestinal: Negative for abdominal pain, nausea and vomiting.      OBJECTIVE:  Blood pressure (!) 142/82, pulse 76, temperature 98 F (36.7 C), temperature source Oral, resp. rate 16, height 5' 6.14" (1.68 m), weight (!) 336 lb (152.4 kg), SpO2 94 %.  Repeat BP 138/88  Physical Exam  Constitutional: She is oriented to person, place, and time and well-developed, well-nourished, and in no distress.  HENT:  Head: Normocephalic and atraumatic.  Mouth/Throat: Oropharynx is clear and moist. No oropharyngeal exudate.  Eyes: Pupils are equal, round, and reactive to light. EOM are normal. No scleral icterus.  Neck: Neck supple.  Cardiovascular: Normal rate, regular rhythm and normal heart sounds.  Exam reveals no gallop and no friction rub.   No murmur heard. Pulmonary/Chest: Effort normal and breath sounds normal. She has no wheezes. She has no rales.  Musculoskeletal: She exhibits no edema.  Neurological: She is alert and oriented to person, place, and time. Gait normal.  Skin: Skin is warm and dry.    Results for orders placed or performed in visit on 10/23/16 (from the past 24 hour(s))  POCT glucose (manual entry)     Status: Abnormal   Collection Time: 10/23/16  2:57 PM  Result Value Ref Range   POC Glucose 219 (A) 70 - 99 mg/dl  POCT glycosylated hemoglobin (Hb A1C)     Status: None   Collection Time: 10/23/16  2:57 PM  Result Value Ref Range   Hemoglobin A1C 6.3   POCT urinalysis dipstick     Status: Abnormal   Collection Time: 10/23/16  2:57 PM  Result Value Ref Range   Color, UA yellow yellow   Clarity, UA cloudy (A) clear   Glucose, UA negative negative mg/dL   Bilirubin, UA negative negative   Ketones, POC UA negative negative mg/dL   Spec Grav, UA 1.010 1.010 - 1.025   Blood, UA trace-lysed (A) negative   pH, UA 5.5 5.0 - 8.0   Protein Ur, POC =30 (A) negative mg/dL   Urobilinogen, UA 0.2 0.2 or 1.0 E.U./dL   Nitrite, UA Negative Negative   Leukocytes, UA Large (3+) (A) Negative    No results found.   ASSESSMENT and  PLAN  Problem List Items Addressed This Visit    Essential hypertension    BP slightly above goal given CKD, could consider increasing diovan to 184m for BP goal < 130/80. However given interest in weight loss will cont current regime and re-eval at next visit as BP would improve with weight loss.       Relevant Medications   hydrochlorothiazide (HYDRODIURIL) 25 MG tablet   valsartan (DIOVAN) 80 MG tablet   Other Relevant Orders   Comprehensive metabolic panel  Microalbumin/Creatinine Ratio, Urine   Ambulatory referral to Ophthalmology   DM (diabetes mellitus) - Primary    a1c at goal, cont with metformin, need to be mindful given CKD Referring to nutrition for weight loss Referring to opthomology for routine DM eye exam      Relevant Medications   metFORMIN (GLUCOPHAGE) 500 MG tablet   valsartan (DIOVAN) 80 MG tablet   Other Relevant Orders   POCT glucose (manual entry) (Completed)   POCT glycosylated hemoglobin (Hb A1C) (Completed)   POCT urinalysis dipstick (Completed)   Comprehensive metabolic panel   Microalbumin/Creatinine Ratio, Urine   Ambulatory referral to Ophthalmology   Care order/instruction:   Referral to Nutrition and Diabetes Services   Severe obesity (BMI >= 40) ([place])    Referring to nutrition as requested.      Relevant Medications   metFORMIN (GLUCOPHAGE) 500 MG tablet   Other Relevant Orders   Referral to Nutrition and Diabetes Services    Other Visit Diagnoses    Need for prophylactic vaccination and inoculation against influenza       Relevant Orders   Flu Vaccine QUAD 36+ mos IM (Completed)   Encounter for screening mammogram for breast cancer       Relevant Orders   MM DIGITAL SCREENING BILATERAL   Colon cancer screening       Relevant Orders   Ambulatory referral to Gastroenterology      Return in about 6 months (around 04/23/2017).    Rutherford Guys, MD Primary [facility] at [address], [place] [address] Ph.   [phone] Fax [phone]

## 2016-10-23 NOTE — Patient Instructions (Signed)
     IF you received an x-ray today, you will receive an invoice from [facility]. Please contact [facility] at [phone] with questions or concerns regarding your invoice.   IF you received labwork today, you will receive an invoice from [facility]. Please contact [facility] at [phone] with questions or concerns regarding your invoice.   Our billing staff will not be able to assist you with questions regarding bills from these companies.  You will be contacted with the lab results as soon as they are available. The fastest way to get your results is to activate your My Chart account. Instructions are located on the last page of this paperwork. If you have not heard from us regarding the results in 2 weeks, please contact this office.     

## 2016-10-24 ENCOUNTER — Telehealth: Payer: Self-pay | Admitting: Family Medicine

## 2016-10-24 DIAGNOSIS — E1122 Type 2 diabetes mellitus with diabetic chronic kidney disease: Secondary | ICD-10-CM

## 2016-10-24 DIAGNOSIS — N183 Chronic kidney disease, stage 3 unspecified: Secondary | ICD-10-CM

## 2016-10-24 DIAGNOSIS — I10 Essential (primary) hypertension: Secondary | ICD-10-CM

## 2016-10-24 LAB — COMPREHENSIVE METABOLIC PANEL
ALT: 12 IU/L (ref 0–32)
AST: 14 IU/L (ref 0–40)
Albumin/Globulin Ratio: 1.1 — ABNORMAL LOW (ref 1.2–2.2)
Albumin: 4.1 g/dL (ref 3.5–4.8)
Alkaline Phosphatase: 100 IU/L (ref 39–117)
BUN/Creatinine Ratio: 14 (ref 12–28)
BUN: 19 mg/dL (ref 8–27)
Bilirubin Total: 0.2 mg/dL (ref 0.0–1.2)
CO2: 22 mmol/L (ref 20–29)
Calcium: 9.4 mg/dL (ref 8.7–10.3)
Chloride: 100 mmol/L (ref 96–106)
Creatinine, Ser: 1.34 mg/dL — ABNORMAL HIGH (ref 0.57–1.00)
GFR calc Af Amer: 46 mL/min/{1.73_m2} — ABNORMAL LOW (ref >59)
GFR calc non Af Amer: 40 mL/min/{1.73_m2} — ABNORMAL LOW (ref >59)
Globulin, Total: 3.9 g/dL (ref 1.5–4.5)
Glucose: 166 mg/dL — ABNORMAL HIGH (ref 65–99)
Potassium: 3.8 mmol/L (ref 3.5–5.2)
Sodium: 140 mmol/L (ref 134–144)
Total Protein: 8 g/dL (ref 6.0–8.5)

## 2016-10-24 LAB — MICROALBUMIN / CREATININE URINE RATIO
Creatinine, Urine: 69.4 mg/dL
Microalb/Creat Ratio: 106.8 mg/g creat — ABNORMAL HIGH (ref 0.0–30.0)
Microalbumin, Urine: 74.1 ug/mL

## 2016-10-24 NOTE — Telephone Encounter (Signed)
PATIENT STATES SHE SAW DR. SANTIAGO TUES. (10/23/16) AND SHE REFILLED HER MEDICATIONS. SHE SAID SHE TOLD DR. SANTIAGO THAT THE VALSARTAN (DIOVAN) HAS BEEN PULLED OFF THE SHELF BECAUSE IT IS DISCONTINUED. SHE NEEDS A SUBSTITUTE AS SOON AS POSSIBLE BECAUSE HER BLOOD PRESSURE IS ELEVATED. PLEASE CALL ALL OF HER MEDICATIONS FROM TUES. TO [place]. SHE DOES NOT USE [facility] ON [address] ANY MORE. PLEASE CALL HER WHEN IT HAS BEEN DONE. BEST PHONE [phone] (CELL) [place]

## 2016-10-24 NOTE — Telephone Encounter (Signed)
Please advise 

## 2016-10-25 ENCOUNTER — Telehealth: Payer: Self-pay | Admitting: Family Medicine

## 2016-10-25 MED ORDER — HYDROCHLOROTHIAZIDE 25 MG PO TABS: 25.0000 mg | ORAL_TABLET | Freq: Every day | ORAL | 3 refills | Status: DC

## 2016-10-25 MED ORDER — METFORMIN HCL 500 MG PO TABS: 500.0000 mg | ORAL_TABLET | Freq: Two times a day (BID) | ORAL | 3 refills | Status: DC

## 2016-10-25 MED ORDER — LOSARTAN POTASSIUM 50 MG PO TABS: 50.0000 mg | ORAL_TABLET | Freq: Every day | ORAL | 3 refills | Status: DC

## 2016-10-25 MED ORDER — ALBUTEROL SULFATE (2.5 MG/3ML) 0.083% IN NEBU: 2.5000 mg | INHALATION_SOLUTION | Freq: Four times a day (QID) | RESPIRATORY_TRACT | 3 refills | Status: DC | PRN

## 2016-10-25 NOTE — Telephone Encounter (Signed)
Pt. Called in to say that the RX valsartan is not being made anymore. States that she needs something to replace it. She needs it sent to [facility] on [address].   I explained to her that it has been replaced with the new RX and is waiting for her at [facility] on [address]

## 2016-10-25 NOTE — Telephone Encounter (Signed)
Lm detailed per release

## 2016-10-25 NOTE — Telephone Encounter (Signed)
Sorry about the valsartan, I changed it to losartan 50mg  once a day. Sent all meds to [facility] on [address]. Please have her come back in about 2 weeks for nurse BP check to make sure new BP medication is working well. thanks

## 2016-11-15 ENCOUNTER — Telehealth: Payer: Self-pay

## 2016-11-15 NOTE — Telephone Encounter (Signed)
Called pt to schedule Medicare Annual Wellness Visit. -nr  

## 2016-11-22 ENCOUNTER — Encounter: Payer: Self-pay | Admitting: Dietician

## 2016-11-22 ENCOUNTER — Encounter: Payer: Medicare Other | Attending: Family Medicine | Admitting: Dietician

## 2016-11-22 DIAGNOSIS — E1122 Type 2 diabetes mellitus with diabetic chronic kidney disease: Secondary | ICD-10-CM | POA: Diagnosis not present

## 2016-11-22 DIAGNOSIS — Z713 Dietary counseling and surveillance: Secondary | ICD-10-CM | POA: Insufficient documentation

## 2016-11-22 DIAGNOSIS — N183 Chronic kidney disease, stage 3 (moderate): Secondary | ICD-10-CM | POA: Diagnosis not present

## 2016-11-22 NOTE — Patient Instructions (Addendum)
Make an eye appointment with Dr. Katy Fitch Consider making an appointment to get your dentures to fit better so you can eat better. Recommend using the C-pap every time you sleep.   Eat Breakfast, Lunch, and Dinner daily, small snacks when hungry. Pay attention to your body.  "When am I full?" Choose foods that have nutrition and avoid those that don't (such as the sugar containing foods). Eat more vegetables.  1 sandwich rather than 2

## 2016-11-22 NOTE — Progress Notes (Signed)
Diabetes Self-Management Education  Visit Type: First/Initial  Appt. Start Time: 1100 Appt. End Time: 1200  11/22/2016  Christine Parrish, identified by name and date of birth, is a 71 y.o. female with a diagnosis of Diabetes: Type 2.  Other hx includes COPD, HTN, asthma and OSA.  She does not sue C-pap.  Her sleeping habits are very poor.  She naps on and off.  Sometimes she will go 2-3 days without consistent sleep.  She is unable to get up out of a chair so eats in bed and always lays down.  She states that her back hurts.  She states that since retiring, she eats what she wants, when she wants even in the middle of the night when she can't remember eating it.  She wants to lose weight to lose weight to help her back not hurt, avoid dialysis and would like to get another job and buy a car so she can go to the gym. Labs include:  GFR 46, BUN 19, Creatine 1.34, A1C 6.3% (10/23/16) Medication includes:    Patient, daughter, and grandson live together.  Her daughter does the shopping and cooking.  Her daughter prefers to bake foods but patient requests fried.  She gives her daughter a list of foods to buy.  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  She states that she never slept well and has not transitioned to a daytime schedule.  Her weight has increased since retiring.  She was in Weight Watchers and other program in the past and lost weight but gained it back.  She eats increased amounts of sandwiches.  ASSESSMENT  Height 5\' 8"  (1.727 m), weight (!) 335 lb (152 kg). Body mass index is 50.94 kg/m.      Diabetes Self-Management Education - 11/22/16 1118      Visit Information   Visit Type First/Initial     Initial Visit   Diabetes Type Type 2   Are you currently following a meal plan? No   Are you taking your medications as prescribed? Yes   Date Diagnosed 3-4 years     Health Coping   How would you rate your overall health? Fair     Psychosocial  Assessment   Patient Belief/Attitude about Diabetes Other (comment)  unmotivated   Self-care barriers None   Self-management support Doctor's office;Family   Other persons present Patient;Family Member   Patient Concerns Nutrition/Meal planning;Glycemic Control;Weight Control   Special Needs Simplified materials   Preferred Learning Style No preference indicated   Learning Readiness Ready   How often do you need to have someone help you when you read instructions, pamphlets, or other written materials from your doctor or pharmacy? 1 - Never   What is the last grade level you completed in school? 9th grade     Pre-Education Assessment   Patient understands the diabetes disease and treatment process. Needs Instruction   Patient understands incorporating nutritional management into lifestyle. Needs Instruction   Patient undertands incorporating physical activity into lifestyle. Needs Instruction   Patient understands using medications safely. Needs Instruction   Patient understands monitoring blood glucose, interpreting and using results Needs Instruction   Patient understands prevention, detection, and treatment of acute complications. Needs Instruction   Patient understands prevention, detection, and treatment of chronic complications. Needs Instruction   Patient understands how to develop strategies to address psychosocial issues. Needs Instruction   Patient understands how to develop strategies to promote health/change behavior. Needs  Instruction     Complications   Last HgB A1C per patient/outside source 6.3 %  10/23/16 decreased from 6.5% 03/2016   How often do you check your blood sugar? 0 times/day (not testing)  just when she feels bad and never over 200   Number of hypoglycemic episodes per month 0   Number of hyperglycemic episodes per week 0   Have you had a dilated eye exam in the past 12 months? Yes   Have you had a dental exam in the past 12 months? Yes   Are you checking  your feet? Yes   How many days per week are you checking your feet? 7     Dietary Intake   Breakfast bacon, grits eggs, 3 slices [employer] toast, diet soda  11 am   Snack (morning) grazes   Lunch grazes   Snack (afternoon) cake, peanut butter sandwich, fruit   Dinner 2 peanut butter sandwiches or 2 bologna or ham sandwiches OR boiled chicken, peas, corn, okra and noodles  midnight   Snack (evening) sandwiches or Oodles of noodles, occasional salad  3 am   Beverage(s) diet soda, water, sweet tea     Exercise   Exercise Type ADL's  lays down all of the time because she can't get up from a chair   How many days per week to you exercise? 0   How many minutes per day do you exercise? 0   Total minutes per week of exercise 0     Patient Education   Previous Diabetes Education No   Nutrition management  Role of diet in the treatment of diabetes and the relationship between the three main macronutrients and blood glucose level;Meal options for control of blood glucose level and chronic complications.;Other (comment)  mindful eating, nutrient density   Psychosocial adjustment Worked with patient to identify barriers to care and solutions;Identified and addressed patients feelings and concerns about diabetes   Personal strategies to promote health Lifestyle issues that need to be addressed for better diabetes care     Individualized Goals (developed by patient)   Nutrition General guidelines for healthy choices and portions discussed   Physical Activity Exercise 1-2 times per week;15 minutes per day   Medications take my medication as prescribed   Problem Solving meal timing, portion sizes   Reducing Risk do foot checks daily;Other (comment)  nutrient dense choices   Health Coping ask for help with (comment)  MD/RD     Post-Education Assessment   Patient understands the diabetes disease and treatment process. Needs Instruction   Patient understands incorporating nutritional management into  lifestyle. Needs Review   Patient undertands incorporating physical activity into lifestyle. Needs Review   Patient understands using medications safely. Demonstrates understanding / competency   Patient understands monitoring blood glucose, interpreting and using results Demonstrates understanding / competency   Patient understands prevention, detection, and treatment of acute complications. Demonstrates understanding / competency   Patient understands prevention, detection, and treatment of chronic complications. Demonstrates understanding / competency   Patient understands how to develop strategies to address psychosocial issues. Needs Review   Patient understands how to develop strategies to promote health/change behavior. Needs Review     Outcomes   Expected Outcomes Other (comment)  demonstrated interest in learing but barriers to change   Future DMSE 4-6 wks   Program Status Not Completed      Individualized Plan for Diabetes Self-Management Training:   Learning Objective:  Patient will have a greater understanding of  diabetes self-management. Patient education plan is to attend individual and/or group sessions per assessed needs and concerns.   Plan:   Patient Instructions  Make an eye appointment with Dr. Katy Fitch Consider making an appointment to get your dentures to fit better so you can eat better. Recommend using the C-pap every time you sleep.   Eat Breakfast, Lunch, and Dinner daily, small snacks when hungry. Pay attention to your body.  "When am I full?" Choose foods that have nutrition and avoid those that don't (such as the sugar containing foods). Eat more vegetables.  1 sandwich rather than 2     Expected Outcomes:  Other (comment) (demonstrated interest in learing but barriers to change)  Education material provided: Meal plan card, My Plate and Snack sheet  If problems or questions, patient to contact team via:  Phone  Future DSME appointment: 4-6  wks

## 2016-12-07 ENCOUNTER — Ambulatory Visit: Payer: Self-pay

## 2016-12-27 ENCOUNTER — Ambulatory Visit: Payer: Medicare Other | Admitting: Dietician

## 2017-01-02 ENCOUNTER — Encounter: Payer: Self-pay | Admitting: Family Medicine

## 2017-04-04 ENCOUNTER — Encounter: Payer: Self-pay | Admitting: Family Medicine

## 2017-04-04 ENCOUNTER — Ambulatory Visit: Payer: Medicare Other | Admitting: Family Medicine

## 2017-04-04 ENCOUNTER — Other Ambulatory Visit: Payer: Self-pay

## 2017-04-04 VITALS — BP 144/82 | Temp 98.3°F | Ht 65.75 in | Wt 331.6 lb

## 2017-04-04 DIAGNOSIS — Z23 Encounter for immunization: Secondary | ICD-10-CM

## 2017-04-04 DIAGNOSIS — E1122 Type 2 diabetes mellitus with diabetic chronic kidney disease: Secondary | ICD-10-CM

## 2017-04-04 DIAGNOSIS — Z7409 Other reduced mobility: Secondary | ICD-10-CM

## 2017-04-04 DIAGNOSIS — Z789 Other specified health status: Secondary | ICD-10-CM

## 2017-04-04 DIAGNOSIS — N183 Chronic kidney disease, stage 3 unspecified: Secondary | ICD-10-CM

## 2017-04-04 DIAGNOSIS — M25512 Pain in left shoulder: Secondary | ICD-10-CM | POA: Diagnosis not present

## 2017-04-04 DIAGNOSIS — I10 Essential (primary) hypertension: Secondary | ICD-10-CM | POA: Diagnosis not present

## 2017-04-04 LAB — HEMOGLOBIN A1C
Est. average glucose Bld gHb Est-mCnc: 134 mg/dL
Hgb A1c MFr Bld: 6.3 % — ABNORMAL HIGH (ref 4.8–5.6)

## 2017-04-04 MED ORDER — TRIAMCINOLONE ACETONIDE 40 MG/ML IJ SUSP
40.0000 mg | Freq: Once | INTRAMUSCULAR | Status: AC
  Administered 2017-04-04: 40 mg via INTRAMUSCULAR

## 2017-04-04 MED ORDER — METFORMIN HCL 500 MG PO TABS: 500.0000 mg | ORAL_TABLET | Freq: Two times a day (BID) | ORAL | 3 refills | Status: DC

## 2017-04-04 MED ORDER — LOSARTAN POTASSIUM 50 MG PO TABS: 50.0000 mg | ORAL_TABLET | Freq: Every day | ORAL | 3 refills | Status: DC

## 2017-04-04 MED ORDER — HYDROCHLOROTHIAZIDE 25 MG PO TABS: 25.0000 mg | ORAL_TABLET | Freq: Every day | ORAL | 3 refills | Status: DC

## 2017-04-04 NOTE — Progress Notes (Signed)
3/14/201910:31 AM  Christine Parrish 1945-11-21, 72 y.o. female [id number]  Chief Complaint  Patient presents with  . Follow-up    follow up on diabetes mellitus. Needing refills on on diabetes and bp meds as well. Needs referral for eye exam and Mammogram    HPI:   Patient is a 72 y.o. female with past medical history significant for DM2, HTN, arthritis who presents today for routine followup on chronic medical conditions  1. HTN - Did not take BP meds this morning, does not check BP at home, needs refills of medications 2. DM - meet with nutritionist once, felt that suggestions were difficult to implement as "those are all the foods that I eat, I dont know how to cook anything else" she also eats lots of take out, mostly chinese and barbeque because they deliver, she continues to bake desserts. She however it trying to eat healthier. She has cut down on chocolate, from every day to 2-3 times a month, she does not bake as often. When she orders out she always gets veggies and non fried foods. At home when they cook, they bake their meats as well.  She does not check her cbgs Last a1c 6.3, oct 2018 Needs refills of metformin 2. Missed her ophtho and mammogram appts, missed placed appt information, needs info to reschedule 3. She had a pneumonia vaccine about 10 years ago 4 She is c/o flare up of her left shoulder pain, has been going on for about 6 months, getting stiffer, harder to move, cant brush her hair, bathe her back properly, denies any injuries, falls, surgeries 5. She is also requesting a lifting chair, she is morbidly obese, unable to stand from low lying seats without assistance from 2 people, has a raised toilet seat, has a high bed, very sedentary, spends most of the time in bed as she is able to get up from bed on her own  Depression screen [facility] 2/9 04/04/2017 11/22/2016 10/23/2016  Decreased Interest 0 0 0  Down, Depressed, Hopeless 0 0 0  PHQ - 2 Score 0 0 0    Allergies    Allergen Reactions  . Lisinopril Cough    Prior to Admission medications   Medication Sig Start Date End Date Taking? Authorizing Provider  aspirin 81 MG tablet Take 81 mg by mouth daily.   Yes [provider]  Blood Glucose Monitoring Suppl (BLOOD GLUCOSE METER) kit Test blood sugar daily. Dx code: 250.92 06/22/13  Yes Collene Leyden, PA-C  glucose blood test strip Test blood sugar daily. Dx code: 57.92. 06/22/13  Yes Collene Leyden, PA-C  glucose blood test strip Use as instructed 06/22/13  Yes Benjaman Lobe, Sheryl L, DO  glucose monitoring kit (FREESTYLE) monitoring kit 1 each by Does not apply route as needed for other. 06/22/13  Yes Benjaman Lobe, Sheryl L, DO  hydrochlorothiazide (HYDRODIURIL) 25 MG tablet Take 1 tablet (25 mg total) by mouth daily. 10/25/16  Yes Rutherford Guys, MD  Lancets (ACCU-CHEK MULTICLIX) lancets Use as instructed 06/22/13  Yes Benjaman Lobe, Sheryl L, DO  Lancets [facility] ULTRASOFT) lancets Test blood sugar daily. Dx code: 250.92 06/22/13  Yes Marte, Marsh Dolly, PA-C  losartan (COZAAR) 50 MG tablet Take 1 tablet (50 mg total) by mouth daily. 10/25/16  Yes Rutherford Guys, MD  metFORMIN (GLUCOPHAGE) 500 MG tablet Take 1 tablet (500 mg total) by mouth 2 (two) times daily with a meal. 10/25/16  Yes Rutherford Guys, MD  albuterol (PROVENTIL) (2.5  MG/3ML) 0.083% nebulizer solution Take 3 mLs (2.5 mg total) by nebulization every 6 (six) hours as needed for wheezing or shortness of breath. 10/25/16 11/24/16  Rutherford Guys, MD    Past Medical History:  Diagnosis Date  . Arthritis   . Asthma   . Cancer (HCC)    Cervical  . DM (diabetes mellitus) ([place])   . Hypertension   . Hypoxemia 09/25/2013  . Sleep apnea    No CPAP (broken)    Past Surgical History:  Procedure Laterality Date  . ABDOMINAL HYSTERECTOMY    . EYE SURGERY    . TUBAL LIGATION      Social History   Tobacco Use  . Smoking status: Former Smoker    Packs/day: 1.50    Years: 35.00    Pack years:  52.50    Types: Cigarettes    Last attempt to quit: 01/23/2003    Years since quitting: 14.2  . Smokeless tobacco: Never Used  Substance Use Topics  . Alcohol use: No    Family History  Problem Relation Age of Onset  . Emphysema Mother        never smoker  . CAD Mother 20  . Asthma Mother   . Diabetes Father   . CAD Brother 68  . Asthma Daughter   . Asthma Sister     Review of Systems  Constitutional: Negative for chills and fever.  Respiratory: Negative for cough and shortness of breath.   Cardiovascular: Negative for chest pain, palpitations and leg swelling.  Gastrointestinal: Negative for abdominal pain, nausea and vomiting.     OBJECTIVE:  Blood pressure (!) 144/82, temperature 98.3 F (36.8 C), temperature source Oral, height 5' 5.75" (1.67 m), weight (!) 331 lb 9.6 oz (150.4 kg), SpO2 94 %.  Wt Readings from Last 3 Encounters:  04/04/17 (!) 331 lb 9.6 oz (150.4 kg)  11/22/16 (!) 335 lb (152 kg)  10/23/16 (!) 336 lb (152.4 kg)    Physical Exam  Constitutional: She is oriented to person, place, and time and well-developed, well-nourished, and in no distress.  HENT:  Head: Normocephalic and atraumatic.  Mouth/Throat: Oropharynx is clear and moist. No oropharyngeal exudate.  Eyes: EOM are normal. Pupils are equal, round, and reactive to light. No scleral icterus.  Neck: Neck supple.  Cardiovascular: Normal rate, regular rhythm and normal heart sounds. Exam reveals no gallop and no friction rub.  No murmur heard. Pulmonary/Chest: Effort normal and breath sounds normal. She has no wheezes. She has no rales.  Musculoskeletal:       Left shoulder: She exhibits decreased range of motion (flexion, abduction and internal rotation).  Neurological: She is alert and oriented to person, place, and time.  Skin: Skin is warm and dry.    Xray left shoulder, 2013, mild degenerative changes  Procedure note: Risk and benefits of procedure discussed. Verbal consent obtained.  Area was cleansed with betadine. Ethyl chloride was used for topical anesthetic. Left subacromial shoulder injection with 73m kenalog and 2 cc 1% lidocaine given without any difficulty. Patient tolerated procedure well. Improved pain and mobility after injection.   ASSESSMENT and PLAN  1. Type 2 diabetes mellitus with stage 3 chronic kidney disease, without long-term current use of insulin (HCC) Last a1c at goal, rechecking today. Encouraged again small changes to diet, keep working on limiting sweets and increasing vegetables. Encouraged rescheduling ophtho appt, contact information given.  - Comprehensive metabolic panel - Lipid panel - TSH - Hemoglobin A1c - metFORMIN (GLUCOPHAGE)  500 MG tablet; Take 1 tablet (500 mg total) by mouth 2 (two) times daily with a meal.  2. Essential hypertension Elevated today, patient has not taken BP meds. Discussed importance of taking meds. Recheck at next visit. - Comprehensive metabolic panel - CBC - Lipid panel - TSH - losartan (COZAAR) 50 MG tablet; Take 1 tablet (50 mg total) by mouth daily. - hydrochlorothiazide (HYDRODIURIL) 25 MG tablet; Take 1 tablet (25 mg total) by mouth daily.  3. Nontraumatic pain of left shoulder Concern for frozen shoulder. Patient with CKD so nsaids not appropriate, tolerated injection well. Improved symptoms, discussed rehab exercises. Patient educational handout given  - triamcinolone acetonide (KENALOG-40) injection 40 mg  4. Severe obesity (BMI >= 40) (HCC) 5 lbs weight loss since last visit, continue with making small changes to diet.  - CBC - Lipid panel  5. Impaired mobility and activities of daily living Patient with mobility issues due to morbid obesity, deconditioning and chronic pain of left shoulder, uses assistive devices for toileting, needs assistance with transfers, sending rx to DME agency. Power Warden/ranger - Chair Lift And Sofa Stand Assist, qty 1 [facility]  Other orders -  Pneumococcal conjugate vaccine 13-valent IM - given today  Return in about 6 months (around 10/05/2017).    Rutherford Guys, MD Primary Care at [place], [place] [address] Ph.  [phone] Fax [phone]

## 2017-04-04 NOTE — Patient Instructions (Addendum)
[place] [address] [phone]  2. [facility] [place] [phone]    IF you received an x-ray today, you will receive an invoice from [facility]. Please contact [facility] at [phone] with questions or concerns regarding your invoice.   IF you received labwork today, you will receive an invoice from [facility]. Please contact [facility] at [phone] with questions or concerns regarding your invoice.   Our billing staff will not be able to assist you with questions regarding bills from these companies.  You will be contacted with the lab results as soon as they are available. The fastest way to get your results is to activate your My Chart account. Instructions are located on the last page of this paperwork. If you have not heard from [place] regarding the results in 2 weeks, please contact this office.

## 2017-04-05 LAB — CBC
Hematocrit: 38.6 % (ref 34.0–46.6)
Hemoglobin: 12.8 g/dL (ref 11.1–15.9)
MCH: 27.3 pg (ref 26.6–33.0)
MCHC: 33.2 g/dL (ref 31.5–35.7)
MCV: 82 fL (ref 79–97)
Platelets: 305 10*3/uL (ref 150–379)
RBC: 4.69 x10E6/uL (ref 3.77–5.28)
RDW: 14 % (ref 12.3–15.4)
WBC: 9.8 10*3/uL (ref 3.4–10.8)

## 2017-04-05 LAB — COMPREHENSIVE METABOLIC PANEL
ALT: 11 IU/L (ref 0–32)
AST: 14 IU/L (ref 0–40)
Albumin/Globulin Ratio: 1 — ABNORMAL LOW (ref 1.2–2.2)
Albumin: 4.1 g/dL (ref 3.5–4.8)
Alkaline Phosphatase: 93 IU/L (ref 39–117)
BUN/Creatinine Ratio: 17 (ref 12–28)
BUN: 20 mg/dL (ref 8–27)
Bilirubin Total: 0.3 mg/dL (ref 0.0–1.2)
CO2: 25 mmol/L (ref 20–29)
Calcium: 9.5 mg/dL (ref 8.7–10.3)
Chloride: 99 mmol/L (ref 96–106)
Creatinine, Ser: 1.21 mg/dL — ABNORMAL HIGH (ref 0.57–1.00)
GFR calc Af Amer: 52 mL/min/{1.73_m2} — ABNORMAL LOW (ref >59)
GFR calc non Af Amer: 45 mL/min/{1.73_m2} — ABNORMAL LOW (ref >59)
Globulin, Total: 4 g/dL (ref 1.5–4.5)
Glucose: 118 mg/dL — ABNORMAL HIGH (ref 65–99)
Potassium: 3.8 mmol/L (ref 3.5–5.2)
Sodium: 139 mmol/L (ref 134–144)
Total Protein: 8.1 g/dL (ref 6.0–8.5)

## 2017-04-05 LAB — LIPID PANEL
Chol/HDL Ratio: 2.9 ratio (ref 0.0–4.4)
Cholesterol, Total: 173 mg/dL (ref 100–199)
HDL: 60 mg/dL (ref >39)
LDL Calculated: 92 mg/dL (ref 0–99)
Triglycerides: 103 mg/dL (ref 0–149)
VLDL Cholesterol Cal: 21 mg/dL (ref 5–40)

## 2017-04-05 LAB — TSH: TSH: 2.37 u[IU]/mL (ref 0.450–4.500)

## 2017-04-11 ENCOUNTER — Telehealth: Payer: Self-pay

## 2017-04-11 DIAGNOSIS — I10 Essential (primary) hypertension: Secondary | ICD-10-CM

## 2017-04-11 NOTE — Telephone Encounter (Signed)
LORSARTAN 50 MG has been recalled. Can it  Be changed to 25 or 100mg 

## 2017-04-16 MED ORDER — LOSARTAN POTASSIUM 100 MG PO TABS: 50.0000 mg | ORAL_TABLET | Freq: Every day | ORAL | 2 refills | Status: DC

## 2017-04-16 NOTE — Telephone Encounter (Signed)
Please let patient know that I sent prescription for 100mg  tablets. She is to take 1/2 tab daily. Thanks.

## 2017-04-25 ENCOUNTER — Ambulatory Visit: Payer: Medicare Other | Admitting: Family Medicine

## 2017-10-29 ENCOUNTER — Ambulatory Visit: Payer: Medicare Other | Admitting: Family Medicine

## 2017-11-25 ENCOUNTER — Ambulatory Visit: Payer: Medicare Other | Admitting: Family Medicine

## 2017-11-25 ENCOUNTER — Encounter: Payer: Self-pay | Admitting: Family Medicine

## 2017-11-25 ENCOUNTER — Other Ambulatory Visit: Payer: Self-pay

## 2017-11-25 ENCOUNTER — Telehealth: Payer: Self-pay | Admitting: Family Medicine

## 2017-11-25 VITALS — BP 159/86 | HR 111 | Temp 98.7°F | Resp 18 | Ht 65.5 in | Wt 332.4 lb

## 2017-11-25 DIAGNOSIS — I10 Essential (primary) hypertension: Secondary | ICD-10-CM

## 2017-11-25 DIAGNOSIS — Z860100 Personal history of colon polyps, unspecified: Secondary | ICD-10-CM

## 2017-11-25 DIAGNOSIS — N183 Chronic kidney disease, stage 3 unspecified: Secondary | ICD-10-CM

## 2017-11-25 DIAGNOSIS — Z1231 Encounter for screening mammogram for malignant neoplasm of breast: Secondary | ICD-10-CM

## 2017-11-25 DIAGNOSIS — E1122 Type 2 diabetes mellitus with diabetic chronic kidney disease: Secondary | ICD-10-CM | POA: Diagnosis not present

## 2017-11-25 DIAGNOSIS — Z1159 Encounter for screening for other viral diseases: Secondary | ICD-10-CM

## 2017-11-25 DIAGNOSIS — E785 Hyperlipidemia, unspecified: Secondary | ICD-10-CM

## 2017-11-25 DIAGNOSIS — Z1211 Encounter for screening for malignant neoplasm of colon: Secondary | ICD-10-CM

## 2017-11-25 DIAGNOSIS — B372 Candidiasis of skin and nail: Secondary | ICD-10-CM

## 2017-11-25 MED ORDER — METFORMIN HCL 500 MG PO TABS: 500.0000 mg | ORAL_TABLET | Freq: Two times a day (BID) | ORAL | 3 refills | Status: DC

## 2017-11-25 MED ORDER — HYDROCHLOROTHIAZIDE 25 MG PO TABS: 25.0000 mg | ORAL_TABLET | Freq: Every day | ORAL | 3 refills | Status: DC

## 2017-11-25 MED ORDER — ATORVASTATIN CALCIUM 20 MG PO TABS: 20.0000 mg | ORAL_TABLET | Freq: Every day | ORAL | 3 refills | Status: DC

## 2017-11-25 MED ORDER — NYSTATIN 100000 UNIT/GM EX CREA: 1.0000 "application " | TOPICAL_CREAM | Freq: Two times a day (BID) | CUTANEOUS | 3 refills | Status: DC

## 2017-11-25 MED ORDER — LOSARTAN POTASSIUM 50 MG PO TABS: 50.0000 mg | ORAL_TABLET | Freq: Every day | ORAL | 1 refills | Status: DC

## 2017-11-25 NOTE — Patient Instructions (Signed)
° ° ° °  If you have lab work done today you will be contacted with your lab results within the next 2 weeks.  If you have not heard from us then please contact us. The fastest way to get your results is to register for My Chart. ° ° °IF you received an x-ray today, you will receive an invoice from [facility]. Please contact [facility] at [phone] with questions or concerns regarding your invoice.  ° °IF you received labwork today, you will receive an invoice from [facility]. Please contact [facility] at [phone] with questions or concerns regarding your invoice.  ° °Our billing staff will not be able to assist you with questions regarding bills from these companies. ° °You will be contacted with the lab results as soon as they are available. The fastest way to get your results is to activate your My Chart account. Instructions are located on the last page of this paperwork. If you have not heard from us regarding the results in 2 weeks, please contact this office. °  ° ° ° °

## 2017-11-25 NOTE — Telephone Encounter (Signed)
Copied from [place] [id number]. Topic: General - Inquiry >> Nov 25, 2017  1:33 PM Conception Chancy, NT wrote: Reason for CRM: patient is calling to see if her lab results has came back from her visit this morning 11/25/17.

## 2017-11-25 NOTE — Progress Notes (Signed)
11/4/201911:27 AM  Christine Parrish Oct 08, 1945, 72 y.o. female [id number]  Chief Complaint  Patient presents with  . Follow-up    diebetes- need refill on all medication    HPI:   Patient is a 72 y.o. female with past medical history significant for DM2, HTN, arthritis who presents today for routine followup on chronic medical conditions  Has been doing well since our last visit Has not acute concerns today Has not taken meds this morning Does not check BP or glucose at home No numbness or tingling of feet, does have occ sharp pain Has noticed decreased vision Has significant issues with  Transportation, does not like to leave the house Has h/o polyps, would like to have referral for colonoscopy Also for mammogram and eye doctor Requesting some yeast underneath breast Needs refills of all her meds  Lab Results  Component Value Date   HGBA1C 6.3 (H) 04/04/2017   HGBA1C 6.3 10/23/2016   HGBA1C 6.5 03/29/2016   Lab Results  Component Value Date   LDLCALC 92 04/04/2017   CREATININE 1.21 (H) 04/04/2017    Fall Risk  11/25/2017 11/25/2017 04/04/2017 11/22/2016 10/23/2016  Falls in the past year? 0 0 No No No  Number falls in past yr: - - - - -  Injury with Fall? - - - - -     Depression screen [facility] 2/9 11/25/2017 11/25/2017 04/04/2017  Decreased Interest 0 0 0  Down, Depressed, Hopeless 0 0 0  PHQ - 2 Score 0 0 0    Allergies  Allergen Reactions  . Lisinopril Cough    Prior to Admission medications   Medication Sig Start Date End Date Taking? Authorizing Provider  aspirin 81 MG tablet Take 81 mg by mouth daily.   Yes [provider]  Blood Glucose Monitoring Suppl (BLOOD GLUCOSE METER) kit Test blood sugar daily. Dx code: 250.92 06/22/13  Yes Collene Leyden, PA-C  glucose blood test strip Test blood sugar daily. Dx code: 73.92. 06/22/13  Yes Collene Leyden, PA-C  glucose blood test strip Use as instructed 06/22/13  Yes Benjaman Lobe, Sheryl L, DO  glucose  monitoring kit (FREESTYLE) monitoring kit 1 each by Does not apply route as needed for other. 06/22/13  Yes Benjaman Lobe, Sheryl L, DO  hydrochlorothiazide (HYDRODIURIL) 25 MG tablet Take 1 tablet (25 mg total) by mouth daily. 04/04/17  Yes Rutherford Guys, MD  Lancets (ACCU-CHEK MULTICLIX) lancets Use as instructed 06/22/13  Yes Benjaman Lobe, Sheryl L, DO  Lancets [facility] ULTRASOFT) lancets Test blood sugar daily. Dx code: 250.92 06/22/13  Yes Marte, Marsh Dolly, PA-C  losartan (COZAAR) 100 MG tablet Take 0.5 tablets (50 mg total) by mouth daily. 04/16/17  Yes Rutherford Guys, MD  metFORMIN (GLUCOPHAGE) 500 MG tablet Take 1 tablet (500 mg total) by mouth 2 (two) times daily with a meal. 04/04/17  Yes Rutherford Guys, MD  albuterol (PROVENTIL) (2.5 MG/3ML) 0.083% nebulizer solution Take 3 mLs (2.5 mg total) by nebulization every 6 (six) hours as needed for wheezing or shortness of breath. 10/25/16 11/24/16  Rutherford Guys, MD    Past Medical History:  Diagnosis Date  . Arthritis   . Asthma   . Cancer (HCC)    Cervical  . DM (diabetes mellitus) ([place])   . Hypertension   . Hypoxemia 09/25/2013  . Sleep apnea    No CPAP (broken)    Past Surgical History:  Procedure Laterality Date  . ABDOMINAL HYSTERECTOMY    . EYE  SURGERY    . TUBAL LIGATION      Social History   Tobacco Use  . Smoking status: Former Smoker    Packs/day: 1.50    Years: 35.00    Pack years: 52.50    Types: Cigarettes    Last attempt to quit: 01/23/2003    Years since quitting: 14.8  . Smokeless tobacco: Never Used  Substance Use Topics  . Alcohol use: No    Family History  Problem Relation Age of Onset  . Emphysema Mother        never smoker  . CAD Mother 48  . Asthma Mother   . Diabetes Father   . CAD Brother 32  . Asthma Daughter   . Asthma Sister     Review of Systems  Constitutional: Negative for chills and fever.  Respiratory: Negative for cough and shortness of breath.   Cardiovascular: Negative for chest  pain, palpitations and leg swelling.  Gastrointestinal: Negative for abdominal pain, nausea and vomiting.     OBJECTIVE:  Blood pressure (!) 159/86, pulse (!) 111, temperature 98.7 F (37.1 C), temperature source Oral, resp. rate 18, height 5' 5.5" (1.664 m), weight (!) 332 lb 6.4 oz (150.8 kg), SpO2 92 %. Body mass index is 54.47 kg/m.   Wt Readings from Last 3 Encounters:  11/25/17 (!) 332 lb 6.4 oz (150.8 kg)  04/04/17 (!) 331 lb 9.6 oz (150.4 kg)  11/22/16 (!) 335 lb (152 kg)   BP Readings from Last 3 Encounters:  11/25/17 (!) 159/86  04/04/17 (!) 144/82  10/23/16 138/88    Physical Exam  Constitutional: She is oriented to person, place, and time. She appears well-developed and well-nourished.  HENT:  Head: Normocephalic and atraumatic.  Mouth/Throat: Oropharynx is clear and moist. No oropharyngeal exudate.  Eyes: Pupils are equal, round, and reactive to light. Conjunctivae and EOM are normal. No scleral icterus.  Neck: Neck supple.  Cardiovascular: Normal rate, regular rhythm and normal heart sounds. Exam reveals no gallop and no friction rub.  No murmur heard. Pulmonary/Chest: Effort normal and breath sounds normal. She has no wheezes. She has no rales.  Musculoskeletal: She exhibits no edema.  Neurological: She is alert and oriented to person, place, and time.  Skin: Skin is warm and dry.  Psychiatric: She has a normal mood and affect.  Nursing note and vitals reviewed.    Diabetic Foot Exam - Simple   Simple Foot Form Diabetic Foot exam was performed with the following findings:  Yes 11/25/2017 11:35 AM  Visual Inspection No deformities, no ulcerations, no other skin breakdown bilaterally:  Yes Sensation Testing Intact to touch and monofilament testing bilaterally:  Yes Pulse Check Posterior Tibialis and Dorsalis pulse intact bilaterally:  Yes Comments     ASSESSMENT and PLAN  1. Essential hypertension Uncontrolled in setting of not taking meds today.    - Comprehensive metabolic panel - hydrochlorothiazide (HYDRODIURIL) 25 MG tablet; Take 1 tablet (25 mg total) by mouth daily. - losartan (COZAAR) 50 MG tablet; Take 1 tablet (50 mg total) by mouth daily.  2. Type 2 diabetes mellitus with stage 3 chronic kidney disease, without long-term current use of insulin ([place]) Checking labs today, medications will be adjusted as needed.  - Hemoglobin A1c - HM Diabetes Foot Exam - Microalbumin/Creatinine Ratio, Urine - Ambulatory referral to Ophthalmology - metFORMIN (GLUCOPHAGE) 500 MG tablet; Take 1 tablet (500 mg total) by mouth 2 (two) times daily with a meal.  3. Encounter for screening mammogram for  breast cancer  4. Colon cancer screening - Ambulatory referral to Gastroenterology  5. Hyperlipidemia, unspecified hyperlipidemia type Starting atorvastatin given DM2.  6. History of colonic polyps - Ambulatory referral to Gastroenterology  7. Visit for screening mammogram - MM DIGITAL SCREENING BILATERAL; Future  8. Candidiasis, intertriginous -nystatin  9. Encounter for hepatitis C screening test for low risk patient - HCV Ab w/Rflx to Verification  Other orders - atorvastatin (LIPITOR) 20 MG tablet; Take 1 tablet (20 mg total) by mouth daily.  Return in about 3 months (around 02/25/2018) for HTN.    Rutherford Guys, MD Primary Care at [place], [place] [address] Ph.  [phone] Fax [phone]

## 2017-11-26 LAB — COMPREHENSIVE METABOLIC PANEL
ALT: 12 IU/L (ref 0–32)
AST: 16 IU/L (ref 0–40)
Albumin/Globulin Ratio: 1 — ABNORMAL LOW (ref 1.2–2.2)
Albumin: 4.2 g/dL (ref 3.5–4.8)
Alkaline Phosphatase: 99 IU/L (ref 39–117)
BUN/Creatinine Ratio: 16 (ref 12–28)
BUN: 24 mg/dL (ref 8–27)
Bilirubin Total: 0.3 mg/dL (ref 0.0–1.2)
CO2: 22 mmol/L (ref 20–29)
Calcium: 9.7 mg/dL (ref 8.7–10.3)
Chloride: 98 mmol/L (ref 96–106)
Creatinine, Ser: 1.48 mg/dL — ABNORMAL HIGH (ref 0.57–1.00)
GFR calc Af Amer: 40 mL/min/{1.73_m2} — ABNORMAL LOW (ref >59)
GFR calc non Af Amer: 35 mL/min/{1.73_m2} — ABNORMAL LOW (ref >59)
Globulin, Total: 4.2 g/dL (ref 1.5–4.5)
Glucose: 123 mg/dL — ABNORMAL HIGH (ref 65–99)
Potassium: 3.5 mmol/L (ref 3.5–5.2)
Sodium: 139 mmol/L (ref 134–144)
Total Protein: 8.4 g/dL (ref 6.0–8.5)

## 2017-11-26 LAB — MICROALBUMIN / CREATININE URINE RATIO
Creatinine, Urine: 143.5 mg/dL
Microalb/Creat Ratio: 25.8 mg/g creat (ref 0.0–30.0)
Microalbumin, Urine: 37 ug/mL

## 2017-11-26 LAB — HEMOGLOBIN A1C
Est. average glucose Bld gHb Est-mCnc: 128 mg/dL
Hgb A1c MFr Bld: 6.1 % — ABNORMAL HIGH (ref 4.8–5.6)

## 2017-11-26 LAB — HCV INTERPRETATION

## 2017-11-26 LAB — HCV AB W/RFLX TO VERIFICATION: HCV Ab: <0.1 s/co ratio (ref 0.0–0.9)

## 2017-11-29 ENCOUNTER — Encounter: Payer: Self-pay | Admitting: Gastroenterology

## 2017-12-18 ENCOUNTER — Telehealth: Payer: Self-pay

## 2017-12-18 NOTE — Telephone Encounter (Signed)
I would like to see her in the office first. Thanks

## 2017-12-18 NOTE — Telephone Encounter (Signed)
Dr. Havery Moros,  Christine Parrish is a direct colonoscopy screening. Her BMI is 54.5,  weight 332 lb. Do you want to see this patient in the office or would you like for her to be a direct hospital colonoscopy?  Please advise . Thanks!  Riki Sheer, LPN ( PV )

## 2017-12-23 ENCOUNTER — Encounter: Payer: Self-pay | Admitting: Physician Assistant

## 2017-12-23 NOTE — Telephone Encounter (Signed)
Patient needs to be scheduled for an OV with one of our PA's or with Dr. Havery Moros. Patient is scheduled for PV on 12/27/17 and a direct colonoscopy on 01/08/18. Patients BMI is 54.5 and will need to be scheduled at the hospital. Dr. Havery Moros wants her seen in the office prior to a procedure. PV and colonoscopy on 01/08/18 need to be cancelled once she schedules an OV. I tried to schedule her while I had her on the phone but Shavonda states she needs to talk to her daughter because she will be bringing her to her appointments. Patient states she will call back about 1:30 this afternoon.   Riki Sheer, LPN ( PV )

## 2017-12-23 NOTE — Telephone Encounter (Signed)
Pt scheduled for ov on 12/31/17 with Ellouise Newer, PA.

## 2017-12-31 ENCOUNTER — Ambulatory Visit: Payer: Medicare Other | Admitting: Physician Assistant

## 2018-01-08 ENCOUNTER — Encounter: Payer: Medicare Other | Admitting: Gastroenterology

## 2018-02-04 ENCOUNTER — Ambulatory Visit: Payer: Medicare Other | Admitting: Physician Assistant

## 2018-02-12 ENCOUNTER — Encounter: Payer: Self-pay | Admitting: Physician Assistant

## 2018-02-12 ENCOUNTER — Other Ambulatory Visit: Payer: Self-pay

## 2018-02-12 ENCOUNTER — Ambulatory Visit: Payer: Medicare Other | Admitting: Physician Assistant

## 2018-02-12 ENCOUNTER — Telehealth: Payer: Self-pay

## 2018-02-12 VITALS — BP 132/74 | HR 82 | Ht 65.5 in | Wt 331.0 lb

## 2018-02-12 DIAGNOSIS — Z01818 Encounter for other preprocedural examination: Secondary | ICD-10-CM

## 2018-02-12 DIAGNOSIS — Z1211 Encounter for screening for malignant neoplasm of colon: Secondary | ICD-10-CM

## 2018-02-12 DIAGNOSIS — Z9981 Dependence on supplemental oxygen: Secondary | ICD-10-CM | POA: Diagnosis not present

## 2018-02-12 DIAGNOSIS — Z1212 Encounter for screening for malignant neoplasm of rectum: Secondary | ICD-10-CM

## 2018-02-12 NOTE — Telephone Encounter (Signed)
-----   Message from Yetta Flock, MD sent at 02/12/2018  1:02 PM EST ----- Regarding: RE: needs colon hosp Thanks Anderson Malta. I believe February is full. I will add her to the wait list to do this sometime in March. Does not sound urgent. Thanks   Jan, FYI, can you place this patient on the wait list for hospital cases - if anyone cancels in Feb can add her there. Otherwise will need to await March schedule. Thanks ----- Message ----- From: Levin Erp, PA Sent: 02/12/2018  11:55 AM EST To: Yetta Flock, MD Subject: needs colon hosp                               Needs colon in hosp due to BMI and O2 use. When would you like this done?  Thanks-JLL

## 2018-02-12 NOTE — Progress Notes (Signed)
error 

## 2018-02-12 NOTE — Progress Notes (Signed)
Agree with assessment and plan as outlined.  

## 2018-02-12 NOTE — Telephone Encounter (Signed)
Called and LM for pt that Dr. Havery Moros has an opening for a colon at [facility] on Tuesday, 2-4 at 8:30am to arrive at 7:00am.  We need to have her come in next week for a previsit. Asked her to call back to confirm colon date of 2-4 and to pick a date and time for previsit.

## 2018-02-12 NOTE — Progress Notes (Signed)
Chief Complaint: Preprocedural visit for a screening colonoscopy  HPI:    [name] is a 73 year old AA female with a past medical history as listed below, previously scheduled for colonoscopy with Dr. Havery Moros, who was referred to me by Wardell Honour, MD for a preprocedural visit for a screening colonoscopy due to increased BMI.      Today, the patient presents clinic and explains it has been over 10 years since her last colonoscopy.  She is unsure where this was done exactly but was told that she had polyps.  She does not recall what they were.  Currently the patient does use oxygen, 2 L at night.  Describes some chronic issues with constipation and hemorrhoids and seeing some bright red blood when wiping but uses a daily stool softener as well as MiraLAX which helps to keep her more regular.    Denies fever, chills, weight loss, change in bowel habits, nausea, vomiting, heartburn, reflux or symptoms that awaken her from sleep.     Past Medical History:  Diagnosis Date  . Arthritis   . Asthma   . Cancer (HCC)    Cervical  . DM (diabetes mellitus) ([place])   . Hypertension   . Hypoxemia 09/25/2013  . Sleep apnea    No CPAP (broken)    Past Surgical History:  Procedure Laterality Date  . ABDOMINAL HYSTERECTOMY    . EYE SURGERY    . TUBAL LIGATION      Current Outpatient Medications  Medication Sig Dispense Refill  . albuterol (PROVENTIL) (2.5 MG/3ML) 0.083% nebulizer solution Take 3 mLs (2.5 mg total) by nebulization every 6 (six) hours as needed for wheezing or shortness of breath. 150 mL 3  . aspirin 81 MG tablet Take 81 mg by mouth daily.    Marland Kitchen atorvastatin (LIPITOR) 20 MG tablet Take 1 tablet (20 mg total) by mouth daily. 90 tablet 3  . Blood Glucose Monitoring Suppl (BLOOD GLUCOSE METER) kit Test blood sugar daily. Dx code: 250.92 1 each 0  . glucose blood test strip Test blood sugar daily. Dx code: 250.92. 100 each 3  . glucose blood test strip Use as instructed 100 each 12   . glucose monitoring kit (FREESTYLE) monitoring kit 1 each by Does not apply route as needed for other. 1 each 0  . hydrochlorothiazide (HYDRODIURIL) 25 MG tablet Take 1 tablet (25 mg total) by mouth daily. 90 tablet 3  . Lancets (ACCU-CHEK MULTICLIX) lancets Use as instructed 100 each 12  . Lancets (ONETOUCH ULTRASOFT) lancets Test blood sugar daily. Dx code: 250.92 100 each 3  . losartan (COZAAR) 50 MG tablet Take 1 tablet (50 mg total) by mouth daily. 90 tablet 1  . metFORMIN (GLUCOPHAGE) 500 MG tablet Take 1 tablet (500 mg total) by mouth 2 (two) times daily with a meal. 180 tablet 3  . nystatin cream (MYCOSTATIN) Apply 1 application topically 2 (two) times daily. 30 g 3   No current facility-administered medications for this visit.     Allergies as of 02/12/2018 - Review Complete 11/25/2017  Allergen Reaction Noted  . Lisinopril Cough 04/05/2015    Family History  Problem Relation Age of Onset  . Emphysema Mother        never smoker  . CAD Mother 32  . Asthma Mother   . Diabetes Father   . CAD Brother 79  . Asthma Daughter   . Asthma Sister     Social History   Socioeconomic History  .  Marital status: Widowed    Spouse name: Not on file  . Number of children: 7  . Years of education: 34  . Highest education level: Not on file  Occupational History  . Occupation: Retired  Scientific laboratory technician  . Financial resource strain: Not on file  . Food insecurity:    Worry: Not on file    Inability: Not on file  . Transportation needs:    Medical: Not on file    Non-medical: Not on file  Tobacco Use  . Smoking status: Former Smoker    Packs/day: 1.50    Years: 35.00    Pack years: 52.50    Types: Cigarettes    Last attempt to quit: 01/23/2003    Years since quitting: 15.0  . Smokeless tobacco: Never Used  Substance and Sexual Activity  . Alcohol use: No  . Drug use: No  . Sexual activity: Never  Lifestyle  . Physical activity:    Days per week: Not on file    Minutes  per session: Not on file  . Stress: Not on file  Relationships  . Social connections:    Talks on phone: Not on file    Gets together: Not on file    Attends religious service: Not on file    Active member of club or organization: Not on file    Attends meetings of clubs or organizations: Not on file    Relationship status: Not on file  . Intimate partner violence:    Fear of current or ex partner: Not on file    Emotionally abused: Not on file    Physically abused: Not on file    Forced sexual activity: Not on file  Other Topics Concern  . Not on file  Social History Narrative   Patient is single and her daughter lives with her.   Patient has six living children and one child is deceased.   Patient is retired.   Patient has a 10th grade education.   Patient is right-handed.   Patient drinks three cans of soda daily.          Review of Systems:    Constitutional: No weight loss, fever or chills Skin: No rash  Cardiovascular: No chest pain Respiratory: No SOB  Gastrointestinal: See HPI and otherwise negative Genitourinary: No dysuria Neurological: No headache, dizziness or syncope Musculoskeletal: No new muscle or joint pain Hematologic: No bleeding Psychiatric: No history of depression or anxiety   Physical Exam:  Vital signs: BP 132/74   Pulse 82   Ht 5' 5.5" (1.664 m)   Wt (!) 331 lb (150.1 kg)   BMI 54.24 kg/m   Constitutional:   Pleasant Obese AA female appears to be in NAD, Well developed, Well nourished, alert and cooperative Head:  Normocephalic and atraumatic. Eyes:   PEERL, EOMI. No icterus. Conjunctiva pink. Ears:  Normal auditory acuity. Neck:  Supple Throat: Oral cavity and pharynx without inflammation, swelling or lesion.  Respiratory: Respirations even and unlabored. Lungs clear to auscultation bilaterally.   No wheezes, crackles, or rhonchi.  Cardiovascular: Normal S1, S2. No MRG. Regular rate and rhythm. No peripheral edema, cyanosis or pallor.    Gastrointestinal:  Soft, nondistended, nontender. No rebound or guarding. Normal bowel sounds. No appreciable masses or hepatomegaly. Rectal:  Not performed.  Msk:  Symmetrical without gross deformities. Without edema, no deformity or joint abnormality.  Neurologic:  Alert and  oriented x4;  grossly normal neurologically.  Skin:   Dry and intact  without significant lesions or rashes. Psychiatric: Demonstrates good judgement and reason without abnormal affect or behaviors.  No recent labs or imaging.  Assessment: 1.  Screening for colorectal cancer: patient is 57 and describes previous history of colon polyps 02+ yrs ago, uncertain where this was done 2.  Morbid obesity 3. O2 use  Plan: 1.  Scheduled patient for a colonoscopy at the hospital with Dr. Havery Moros given increased BMI and O2 use.  Discussed risks, benefits, limitations and alternatives and the patient agrees to proceed. 2.  Patient to follow in clinic per recommendations from Dr. Havery Moros after time of procedure.  Ellouise Newer, PA-C [facility] 02/12/2018, 11:22 AM  Cc: Wardell Honour, MD

## 2018-02-12 NOTE — Patient Instructions (Signed)
We will call you with an appointment for your colonoscopy.

## 2018-02-13 NOTE — Telephone Encounter (Signed)
LM for pt to call back.

## 2018-02-17 NOTE — Telephone Encounter (Signed)
LM for pt that we need to confirm her appt for colon at [facility] next week on 2-4 with [place] and she needs to come in for a previsit THIS week.

## 2018-02-18 NOTE — Telephone Encounter (Signed)
Called and spoke to pt.  She cannot make next Tuesday, 2-4 work for her colonoscopy at [employer]. She asked that we put her on a list for March. Dr. Havery Moros notified

## 2018-02-19 ENCOUNTER — Telehealth: Payer: Self-pay | Admitting: Family Medicine

## 2018-02-19 NOTE — Telephone Encounter (Signed)
Copied from [place] [phone]. Topic: Medicare AWV >> Feb 17, 2018  4:39 PM Vernona Rieger wrote: Reason for CRM: Robin with [facility] would like her to have an [place]. She states that she really needs one, she does not drive, it needs to be set out about a month so she can try to get transportation there. >> Feb 18, 2018 10:58 AM Margot Ables wrote: Shirlean Mylar with [facility] called again to check status of scheduling pt for AWV and will call again tomorrow to see if pt is scheduled.  >> Feb 19, 2018 12:09 PM Berneta Levins wrote: Shirlean Mylar with [facility] calling again to find out when pt's AWV will be scheduled.

## 2018-02-19 NOTE — Telephone Encounter (Signed)
Copied from [place] [phone]. Topic: Medicare AWV >> Feb 17, 2018  4:39 PM Vernona Rieger wrote: Reason for CRM: Robin with [facility] would like her to have an [place]. She states that she really needs one, she does not drive, it needs to be set out about a month so she can try to get transportation there. >> Feb 18, 2018 10:58 AM Margot Ables wrote: Shirlean Mylar with [facility] called again to check status of scheduling pt for AWV and will call again tomorrow to see if pt is scheduled.  >> Feb 19, 2018 12:09 PM Berneta Levins wrote: Shirlean Mylar with [facility] calling again to find out when pt's AWV will be scheduled.

## 2018-02-20 NOTE — Telephone Encounter (Signed)
Robin from [facility] calling again to find out when pt will be scheduled for her AWV.

## 2018-02-21 ENCOUNTER — Other Ambulatory Visit: Payer: Self-pay

## 2018-02-21 ENCOUNTER — Emergency Department (HOSPITAL_BASED_OUTPATIENT_CLINIC_OR_DEPARTMENT_OTHER)
Admission: EM | Admit: 2018-02-21 | Discharge: 2018-02-22 | Disposition: A | Discharge status: Home / Self Care | Payer: Medicare Other | Attending: Emergency Medicine | Admitting: Emergency Medicine

## 2018-02-21 ENCOUNTER — Encounter (HOSPITAL_BASED_OUTPATIENT_CLINIC_OR_DEPARTMENT_OTHER): Payer: Self-pay | Admitting: *Deleted

## 2018-02-21 DIAGNOSIS — T783XXA Angioneurotic edema, initial encounter: Secondary | ICD-10-CM

## 2018-02-21 DIAGNOSIS — K140 Glossitis: Secondary | ICD-10-CM | POA: Diagnosis present

## 2018-02-21 DIAGNOSIS — J45909 Unspecified asthma, uncomplicated: Secondary | ICD-10-CM | POA: Diagnosis not present

## 2018-02-21 DIAGNOSIS — Z87891 Personal history of nicotine dependence: Secondary | ICD-10-CM | POA: Diagnosis not present

## 2018-02-21 DIAGNOSIS — Z7982 Long term (current) use of aspirin: Secondary | ICD-10-CM | POA: Diagnosis not present

## 2018-02-21 DIAGNOSIS — E119 Type 2 diabetes mellitus without complications: Secondary | ICD-10-CM | POA: Diagnosis not present

## 2018-02-21 DIAGNOSIS — Z79899 Other long term (current) drug therapy: Secondary | ICD-10-CM | POA: Insufficient documentation

## 2018-02-21 DIAGNOSIS — I10 Essential (primary) hypertension: Secondary | ICD-10-CM | POA: Diagnosis not present

## 2018-02-21 DIAGNOSIS — Z7984 Long term (current) use of oral hypoglycemic drugs: Secondary | ICD-10-CM | POA: Insufficient documentation

## 2018-02-21 MED ORDER — METHYLPREDNISOLONE SODIUM SUCC 125 MG IJ SOLR
125.0000 mg | Freq: Once | INTRAMUSCULAR | Status: AC
  Administered 2018-02-21: 125 mg via INTRAVENOUS
  Filled 2018-02-21: qty 2

## 2018-02-21 MED ORDER — EPINEPHRINE 0.3 MG/0.3ML IJ SOAJ
0.3000 mg | Freq: Once | INTRAMUSCULAR | Status: AC
  Administered 2018-02-21: 0.3 mg via INTRAMUSCULAR
  Filled 2018-02-21: qty 0.3

## 2018-02-21 MED ORDER — DIPHENHYDRAMINE HCL 50 MG/ML IJ SOLN
25.0000 mg | Freq: Once | INTRAMUSCULAR | Status: AC
  Administered 2018-02-21: 25 mg via INTRAVENOUS
  Filled 2018-02-21: qty 1

## 2018-02-21 MED ORDER — FAMOTIDINE IN NACL 20-0.9 MG/50ML-% IV SOLN
20.0000 mg | Freq: Once | INTRAVENOUS | Status: AC
  Administered 2018-02-21: 20 mg via INTRAVENOUS
  Filled 2018-02-21: qty 50

## 2018-02-21 NOTE — ED Notes (Signed)
Pt states she feels like she is improving

## 2018-02-21 NOTE — Telephone Encounter (Signed)
Robin with [employer] calling to speak to Almyra Free to get patient scheduled for AWV. States that she will call again on Monday.

## 2018-02-21 NOTE — ED Provider Notes (Signed)
[facility] EMERGENCY DEPARTMENT Provider Note   CSN: [id number] Arrival date & time: 02/21/18  2239     History   Chief Complaint Chief Complaint  Patient presents with  . Oral Swelling    HPI Christine Parrish is a 73 y.o. female.  Patient presented to the emergency department for evaluation of tongue swelling.  Patient reports that around 8:30 PM tonight she felt a bump on her tongue.  She noted rapid swelling of the entire tongue after that.  She had some external facial swelling as well.  Patient was able to swallow without difficulty.  She did not feel any shortness of breath.  Patient denies ever having similar swelling in the past.  She has not used any new medications or eaten any new foods today.  She denies any rash.     Past Medical History:  Diagnosis Date  . Arthritis   . Asthma   . Cancer (HCC)    Cervical  . DM (diabetes mellitus) ([place])   . Hypertension   . Hypoxemia 09/25/2013  . Sleep apnea    No CPAP (broken)    Patient Active Problem List   Diagnosis Date Noted  . Nontraumatic pain of left shoulder 04/04/2017  . Impaired mobility and activities of daily living 04/04/2017  . OSA (obstructive sleep apnea) 09/25/2013  . Obesity, Class III, BMI 40-49.9 (morbid obesity) ([place]) 09/25/2013  . Snoring 09/25/2013  . DM (diabetes mellitus) 06/10/2013  . Severe obesity (BMI >= 40) ([place]) 06/10/2013  . Cervical neuropathic pain 05/24/2012  . SARCOIDOSIS 12/02/2007  . Essential hypertension 12/02/2007  . ASTHMA 12/02/2007  . SLEEP APNEA 12/02/2007  . CERVICAL CANCER, HX OF 12/02/2007    Past Surgical History:  Procedure Laterality Date  . ABDOMINAL HYSTERECTOMY    . EYE SURGERY    . TUBAL LIGATION       OB History   No obstetric history on file.      Home Medications    Prior to Admission medications   Medication Sig Start Date End Date Taking? Authorizing Provider  aspirin 81 MG tablet Take 81 mg by mouth daily.    [provider]  atorvastatin (LIPITOR) 20 MG tablet Take 1 tablet (20 mg total) by mouth daily. 11/25/17   Rutherford Guys, MD  Blood Glucose Monitoring Suppl (BLOOD GLUCOSE METER) kit Test blood sugar daily. Dx code: 250.92 06/22/13   Collene Leyden, PA-C  glucose blood test strip Test blood sugar daily. Dx code: 106.92. 06/22/13   Collene Leyden, PA-C  glucose blood test strip Use as instructed 06/22/13   Ferman Hamming L, DO  glucose monitoring kit (FREESTYLE) monitoring kit 1 each by Does not apply route as needed for other. 06/22/13   Ferman Hamming L, DO  hydrochlorothiazide (HYDRODIURIL) 25 MG tablet Take 1 tablet (25 mg total) by mouth daily. 11/25/17   Rutherford Guys, MD  Lancets (ACCU-CHEK MULTICLIX) lancets Use as instructed 06/22/13   Ferman Hamming L, DO  Lancets [facility] ULTRASOFT) lancets Test blood sugar daily. Dx code: 250.92 06/22/13   Collene Leyden, PA-C  metFORMIN (GLUCOPHAGE) 500 MG tablet Take 1 tablet (500 mg total) by mouth 2 (two) times daily with a meal. 11/25/17   Rutherford Guys, MD  nystatin cream (MYCOSTATIN) Apply 1 application topically 2 (two) times daily. 11/25/17   Rutherford Guys, MD  OXYGEN Inhale 2 L into the lungs as needed. At home    [provider]  predniSONE (DELTASONE) 20 MG tablet 3 tabs po daily x 3 days, then 2 tabs x 3 days, then 1.5 tabs x 3 days, then 1 tab x 3 days, then 0.5 tabs x 3 days 02/22/18   Orpah Greek, MD    Family History Family History  Problem Relation Age of Onset  . Emphysema Mother        never smoker  . CAD Mother 79  . Asthma Mother   . Diabetes Father   . CAD Brother 75  . Asthma Daughter   . Asthma Sister     Social History Social History   Tobacco Use  . Smoking status: Former Smoker    Packs/day: 1.50    Years: 35.00    Pack years: 52.50    Types: Cigarettes    Last attempt to quit: 01/23/2003    Years since quitting: 15.0  . Smokeless tobacco: Never Used  Substance Use Topics  .  Alcohol use: No  . Drug use: No     Allergies   Lisinopril   Review of Systems Review of Systems  HENT: Positive for facial swelling.   All other systems reviewed and are negative.    Physical Exam Updated Vital Signs BP (!) 148/78   Pulse 97   Temp 99.2 F (37.3 C)   Resp 16   Ht _0  (1.651 m)   Wt (!) 150 kg   SpO2 94%   BMI 55.03 kg/m   Physical Exam Vitals signs and nursing note reviewed.  Constitutional:      General: She is not in acute distress.    Appearance: Normal appearance. She is well-developed.  HENT:     Head: Normocephalic and atraumatic.     Right Ear: Hearing normal.     Left Ear: Hearing normal.     Nose: Nose normal.     Mouth/Throat:     Comments: Anterior tongue edema, normal posterior oropharynx Eyes:     Conjunctiva/sclera: Conjunctivae normal.     Pupils: Pupils are equal, round, and reactive to light.  Neck:     Musculoskeletal: Normal range of motion and neck supple.  Cardiovascular:     Rate and Rhythm: Regular rhythm.     Heart sounds: S1 normal and S2 normal. No murmur. No friction rub. No gallop.   Pulmonary:     Effort: Pulmonary effort is normal. No respiratory distress.     Breath sounds: Normal breath sounds.  Chest:     Chest wall: No tenderness.  Abdominal:     General: Bowel sounds are normal.     Palpations: Abdomen is soft.     Tenderness: There is no abdominal tenderness. There is no guarding or rebound. Negative signs include Murphy's sign and McBurney's sign.     Hernia: No hernia is present.  Musculoskeletal: Normal range of motion.  Skin:    General: Skin is warm and dry.     Findings: No rash.  Neurological:     Mental Status: She is alert and oriented to person, place, and time.     GCS: GCS eye subscore is 4. GCS verbal subscore is 5. GCS motor subscore is 6.     Cranial Nerves: No cranial nerve deficit.     Sensory: No sensory deficit.     Coordination: Coordination normal.  Psychiatric:         Speech: Speech normal.        Behavior: Behavior normal.  Thought Content: Thought content normal.      ED Treatments / Results  Labs (all labs ordered are listed, but only abnormal results are displayed) Labs Reviewed - No data to display  EKG EKG Interpretation  Date/Time:  Friday February 21 2018 22:49:01 EST Ventricular Rate:  116 PR Interval:    QRS Duration: 108 QT Interval:  322 QTC Calculation: 448 R Axis:   118 Text Interpretation:  Sinus tachycardia Right axis deviation Baseline wander in lead(s) I II III aVL aVF V1 V6 Confirmed by Orpah Greek [phone]) on 02/21/2018 11:13:37 PM   Radiology No results found.  Procedures Procedures (including critical care time)  Medications Ordered in ED Medications  diphenhydrAMINE (BENADRYL) injection 25 mg (25 mg Intravenous Given 02/21/18 2258)  methylPREDNISolone sodium succinate (SOLU-MEDROL) 125 mg/2 mL injection 125 mg (125 mg Intravenous Given 02/21/18 2258)  famotidine (PEPCID) IVPB 20 mg premix (0 mg Intravenous Stopped 02/21/18 2310)  EPINEPHrine (EPI-PEN) injection 0.3 mg (0.3 mg Intramuscular Given 02/21/18 2259)     Initial Impression / Assessment and Plan / ED Course  I have reviewed the triage vital signs and the nursing notes.  Pertinent labs & imaging results that were available during my care of the patient were reviewed by me and considered in my medical decision making (see chart for details).     Patient presents with acute angioedema of unclear etiology.  She is on losartan, will stop.  Patient treated here in the ER with Benadryl, Pepcid, Solu-Medrol, epinephrine.  She has been monitored for 3 hours.  She has slowly improved.  And swelling is significantly better at this time.  It is therefore felt that she is safe for discharge.  We will continue prednisone and Benadryl, follow-up with PCP as soon as possible.  Given return precautions.  Final Clinical Impressions(s) / ED Diagnoses    Final diagnoses:  Angioedema, initial encounter    ED Discharge Orders         Ordered    predniSONE (DELTASONE) 20 MG tablet     02/22/18 0225           Orpah Greek, MD 02/22/18 [phone]

## 2018-02-21 NOTE — ED Triage Notes (Signed)
Pt c/o tongue swelling x 2 hrs

## 2018-02-21 NOTE — ED Notes (Signed)
ED Provider at bedside. 

## 2018-02-22 ENCOUNTER — Telehealth: Payer: Self-pay | Admitting: Family Medicine

## 2018-02-22 MED ORDER — PREDNISONE 20 MG PO TABS: ORAL_TABLET | ORAL | 0 refills | Status: DC

## 2018-02-22 NOTE — ED Notes (Signed)
Pt's tongue swelling has significantly improved and pt states she feels much better

## 2018-02-22 NOTE — Telephone Encounter (Signed)
Pt called the office and stated that she was seen in the ED on last night. Patient stated that "she had eaten girl scout cookies, diet soda, bologna sandwich and taking Losartan. Pt also, stated that her tongue 1/2 of her tongue was swollen, she is being treated with Prednisone. Per pt told by ED doctor that it may be her blood pressure medication. I spoke with Dr. Mitchel Honour and was advised to have pt stop Losartan. Pt advised and an appointment was scheduled for Tuesday, 02/25/2018 to follow up regarding blood pressure medication, per Solmon Ice, Clerical.

## 2018-02-22 NOTE — Discharge Instructions (Addendum)
Take Benadryl every 6 hours for the next 2 days and then as needed.  Take the prednisone as prescribed.  Watch her blood sugar closely while you are on the prednisone.  Stop taking losartan.  Casual follow-up with your primary doctor as soon as possible for recheck and determining if you need new blood pressure medication off losartan.

## 2018-02-22 NOTE — ED Notes (Signed)
Pt verbalizes understanding of d/c instructions and denies any further needs at this time. 

## 2018-02-22 NOTE — Telephone Encounter (Signed)
Pt wants to talk to Dr about current BP medicine losartaen 50 mg. Causing toungue swell. Wants to change . Also allergic to lisonopril  FR

## 2018-02-23 NOTE — Telephone Encounter (Signed)
Noted.  Has appt with me in 2 days Will address then

## 2018-02-25 ENCOUNTER — Ambulatory Visit: Payer: Medicare Other | Admitting: Family Medicine

## 2018-02-25 ENCOUNTER — Encounter: Payer: Self-pay | Admitting: Family Medicine

## 2018-02-25 ENCOUNTER — Ambulatory Visit (HOSPITAL_COMMUNITY): Admit: 2018-02-25 | Payer: Medicare Other | Admitting: Gastroenterology

## 2018-02-25 ENCOUNTER — Encounter (HOSPITAL_COMMUNITY): Payer: Self-pay

## 2018-02-25 ENCOUNTER — Other Ambulatory Visit: Payer: Self-pay

## 2018-02-25 VITALS — BP 128/85 | HR 71 | Temp 97.6°F | Ht 65.0 in | Wt 334.0 lb

## 2018-02-25 DIAGNOSIS — N183 Chronic kidney disease, stage 3 unspecified: Secondary | ICD-10-CM

## 2018-02-25 DIAGNOSIS — N393 Stress incontinence (female) (male): Secondary | ICD-10-CM | POA: Diagnosis not present

## 2018-02-25 DIAGNOSIS — I10 Essential (primary) hypertension: Secondary | ICD-10-CM

## 2018-02-25 DIAGNOSIS — E1122 Type 2 diabetes mellitus with diabetic chronic kidney disease: Secondary | ICD-10-CM

## 2018-02-25 DIAGNOSIS — E785 Hyperlipidemia, unspecified: Secondary | ICD-10-CM

## 2018-02-25 SURGERY — COLONOSCOPY WITH PROPOFOL
Anesthesia: Monitor Anesthesia Care

## 2018-02-25 MED ORDER — BLOOD GLUCOSE METER KIT: PACK | 11 refills | Status: AC

## 2018-02-25 MED ORDER — ALBUTEROL SULFATE (2.5 MG/3ML) 0.083% IN NEBU: 2.5000 mg | INHALATION_SOLUTION | Freq: Four times a day (QID) | RESPIRATORY_TRACT | 0 refills | Status: DC | PRN

## 2018-02-25 NOTE — Progress Notes (Signed)
2/4/202011:30 AM  Christine Parrish 01-13-1946, 73 y.o. female [id number]  Chief Complaint  Patient presents with  . Diabetes     wants infor about the freestyle glucose monitor.  . Hypertension    no longer taken the losartan. Want sto talk about lab work. Feels she should not be on certain meds   . Follow-up    went to hospital because tongue from the losartan    HPI:   Patient is a 73 y.o. female with past medical history significant for DM2, HTN, arthritis who presents today for ER followup  Patient seen in ER for angioedema from her losartan on Feb 21 2018 Angioedema has resolved Only taking HCTZ 56m once a day Checks cbgs occasionally only if she feels "off" Takes only metformin, not on insulin Requesting a refill for her albuterol neb solution She uses very prn She did want a back brace Still having issues with her right shoulder, cant do exercises too painful, declines referral at this time Reports she got flu vaccine at [facility] this season Rescheduled colonoscopy due to cost Getting irritated skin from using pull-ups/pads. Uses as she occ will leak when she coughs, etc. Denies urge.   Lab Results  Component Value Date   HGBA1C 6.1 (H) 11/25/2017   HGBA1C 6.3 (H) 04/04/2017   HGBA1C 6.3 10/23/2016   Lab Results  Component Value Date   LDLCALC 92 04/04/2017   CREATININE 1.48 (H) 11/25/2017    Fall Risk  02/25/2018 02/25/2018 11/25/2017 11/25/2017 04/04/2017  Falls in the past year? - 0 0 0 No  Number falls in past yr: 0 0 - - -  Injury with Fall? - 0 - - -  Risk for fall due to : - Impaired mobility - - -     [facility] 02/25/2018 11/25/2017 11/25/2017  Decreased Interest 0 0 0  Down, Depressed, Hopeless 0 0 0  PHQ - 2 Score 0 0 0    Allergies  Allergen Reactions  . Lisinopril Cough    Prior to Admission medications   Medication Sig Start Date End Date Taking? Authorizing Provider  aspirin 81 MG tablet Take 81 mg by mouth daily.   Yes  [provider]  atorvastatin (LIPITOR) 20 MG tablet Take 1 tablet (20 mg total) by mouth daily. 11/25/17  Yes SRutherford Guys MD  Blood Glucose Monitoring Suppl (BLOOD GLUCOSE METER) kit Test blood sugar daily. Dx code: 250.92 06/22/13  Yes MCollene Leyden PA-C  glucose blood test strip Test blood sugar daily. Dx code: 273.92 06/22/13  Yes MCollene Leyden PA-C  glucose blood test strip Use as instructed 06/22/13  Yes GBenjaman Lobe Sheryl L, DO  glucose monitoring kit (FREESTYLE) monitoring kit 1 each by Does not apply route as needed for other. 06/22/13  Yes GBenjaman Lobe Sheryl L, DO  hydrochlorothiazide (HYDRODIURIL) 25 MG tablet Take 1 tablet (25 mg total) by mouth daily. 11/25/17  Yes SRutherford Guys MD  Lancets (ACCU-CHEK MULTICLIX) lancets Use as instructed 06/22/13  Yes GBenjaman Lobe Sheryl L, DO  Lancets ([facility]) lancets Test blood sugar daily. Dx code: 250.92 06/22/13  Yes MCollene Leyden PA-C  metFORMIN (GLUCOPHAGE) 500 MG tablet Take 1 tablet (500 mg total) by mouth 2 (two) times daily with a meal. 11/25/17  Yes SRutherford Guys MD  nystatin cream (MYCOSTATIN) Apply 1 application topically 2 (two) times daily. 11/25/17  Yes SRutherford Guys MD  OXYGEN Inhale 2 L into the lungs as needed.  At home   Yes [provider]  predniSONE (DELTASONE) 20 MG tablet 3 tabs po daily x 3 days, then 2 tabs x 3 days, then 1.5 tabs x 3 days, then 1 tab x 3 days, then 0.5 tabs x 3 days 02/22/18  Yes Pollina, Gwenyth Allegra, MD    Past Medical History:  Diagnosis Date  . Arthritis   . Asthma   . Cancer (HCC)    Cervical  . DM (diabetes mellitus) ([place])   . Hypertension   . Hypoxemia 09/25/2013  . Sleep apnea    No CPAP (broken)    Past Surgical History:  Procedure Laterality Date  . ABDOMINAL HYSTERECTOMY    . EYE SURGERY    . TUBAL LIGATION      Social History   Tobacco Use  . Smoking status: Former Smoker    Packs/day: 1.50    Years: 35.00    Pack years: 52.50    Types:  Cigarettes    Last attempt to quit: 01/23/2003    Years since quitting: 15.1  . Smokeless tobacco: Never Used  Substance Use Topics  . Alcohol use: No    Family History  Problem Relation Age of Onset  . Emphysema Mother        never smoker  . CAD Mother 67  . Asthma Mother   . Diabetes Father   . CAD Brother 66  . Asthma Daughter   . Asthma Sister     Review of Systems  Constitutional: Negative for chills and fever.  Respiratory: Negative for cough and shortness of breath.   Cardiovascular: Negative for chest pain, palpitations and leg swelling.  Gastrointestinal: Negative for abdominal pain, nausea and vomiting.     OBJECTIVE:  Blood pressure 128/85, pulse 71, temperature 97.6 F (36.4 C), temperature source Oral, height 5' 5"  (1.651 m), weight (!) 334 lb (151.5 kg), SpO2 96 %. Body mass index is 55.58 kg/m.   Wt Readings from Last 3 Encounters:  02/25/18 (!) 334 lb (151.5 kg)  02/21/18 (!) 330 lb 11 oz (150 kg)  02/12/18 (!) 331 lb (150.1 kg)    Physical Exam Vitals signs and nursing note reviewed.  Constitutional:      Appearance: She is well-developed.  HENT:     Head: Normocephalic and atraumatic.     Mouth/Throat:     Pharynx: No oropharyngeal exudate.  Eyes:     General: No scleral icterus.    Conjunctiva/sclera: Conjunctivae normal.     Pupils: Pupils are equal, round, and reactive to light.  Neck:     Musculoskeletal: Neck supple.  Cardiovascular:     Rate and Rhythm: Normal rate and regular rhythm.     Heart sounds: Normal heart sounds. No murmur. No friction rub. No gallop.   Pulmonary:     Effort: Pulmonary effort is normal.     Breath sounds: Normal breath sounds. No wheezing or rales.  Skin:    General: Skin is warm and dry.  Neurological:     Mental Status: She is alert and oriented to person, place, and time.     ASSESSMENT and PLAN  1. Essential hypertension Controlled. Doing well off losartan. Continue current regime. Reviewed LFM    2. Type 2 diabetes mellitus with stage 3 chronic kidney disease, without long-term current use of insulin (HCC) Controlled. Continue current regime. Discussed medicare requirements for CGM, patient does not qualify, cont with traditional glucose meter - CMP14+EGFR - TSH  3. Hyperlipidemia, unspecified hyperlipidemia type  Checking labs today, medications will be adjusted as needed.  - TSH - Lipid panel  4. SUI (stress urinary incontinence, female) Discussed use of A+D ointment to protect from irritation of pull-ups. Discussed treatment options.  Other orders - blood glucose meter kit and supplies; Per insurance preference. Use to check blood glucose once a day. Dx E 11.22 - albuterol (PROVENTIL) (2.5 MG/3ML) 0.083% nebulizer solution; Take 3 mLs (2.5 mg total) by nebulization every 6 (six) hours as needed for wheezing or shortness of breath.    Return in about 3 months (around 05/26/2018), or if symptoms worsen or fail to improve.    Rutherford Guys, MD [facility] [place], [place] [address] Ph.  [phone] Fax [phone]

## 2018-02-25 NOTE — Patient Instructions (Signed)
° ° ° °  If you have lab work done today you will be contacted with your lab results within the next 2 weeks.  If you have not heard from us then please contact us. The fastest way to get your results is to register for My Chart. ° ° °IF you received an x-ray today, you will receive an invoice from [facility]. Please contact  Radiology at [phone] with questions or concerns regarding your invoice.  ° °IF you received labwork today, you will receive an invoice from [facility]. Please contact [facility] at [phone] with questions or concerns regarding your invoice.  ° °Our billing staff will not be able to assist you with questions regarding bills from these companies. ° °You will be contacted with the lab results as soon as they are available. The fastest way to get your results is to activate your My Chart account. Instructions are located on the last page of this paperwork. If you have not heard from us regarding the results in 2 weeks, please contact this office. °  ° ° ° °

## 2018-02-26 ENCOUNTER — Other Ambulatory Visit: Payer: Self-pay

## 2018-02-26 ENCOUNTER — Telehealth: Payer: Self-pay

## 2018-02-26 DIAGNOSIS — Z1212 Encounter for screening for malignant neoplasm of rectum: Principal | ICD-10-CM

## 2018-02-26 DIAGNOSIS — Z9981 Dependence on supplemental oxygen: Secondary | ICD-10-CM

## 2018-02-26 DIAGNOSIS — Z1211 Encounter for screening for malignant neoplasm of colon: Secondary | ICD-10-CM

## 2018-02-26 LAB — LIPID PANEL
Chol/HDL Ratio: 2.2 ratio (ref 0.0–4.4)
Cholesterol, Total: 159 mg/dL (ref 100–199)
HDL: 72 mg/dL (ref >39)
LDL Calculated: 65 mg/dL (ref 0–99)
Triglycerides: 110 mg/dL (ref 0–149)
VLDL Cholesterol Cal: 22 mg/dL (ref 5–40)

## 2018-02-26 LAB — CMP14+EGFR
ALT: 7 IU/L (ref 0–32)
AST: 8 IU/L (ref 0–40)
Albumin/Globulin Ratio: 1.2 (ref 1.2–2.2)
Albumin: 4.1 g/dL (ref 3.7–4.7)
Alkaline Phosphatase: 99 IU/L (ref 39–117)
BUN/Creatinine Ratio: 26 (ref 12–28)
BUN: 41 mg/dL — ABNORMAL HIGH (ref 8–27)
Bilirubin Total: <0.2 mg/dL (ref 0.0–1.2)
CO2: 19 mmol/L — ABNORMAL LOW (ref 20–29)
Calcium: 9.3 mg/dL (ref 8.7–10.3)
Chloride: 101 mmol/L (ref 96–106)
Creatinine, Ser: 1.56 mg/dL — ABNORMAL HIGH (ref 0.57–1.00)
GFR calc Af Amer: 38 mL/min/{1.73_m2} — ABNORMAL LOW (ref >59)
GFR calc non Af Amer: 33 mL/min/{1.73_m2} — ABNORMAL LOW (ref >59)
Globulin, Total: 3.5 g/dL (ref 1.5–4.5)
Glucose: 144 mg/dL — ABNORMAL HIGH (ref 65–99)
Potassium: 3.8 mmol/L (ref 3.5–5.2)
Sodium: 140 mmol/L (ref 134–144)
Total Protein: 7.6 g/dL (ref 6.0–8.5)

## 2018-02-26 LAB — TSH: TSH: 1.92 u[IU]/mL (ref 0.450–4.500)

## 2018-02-26 NOTE — Telephone Encounter (Signed)
Called and spoke to pt.  She is scheduled for colon at [facility] on 3-9 with [place] at 11:15am to arrive at 9:45am. (BMI is over and she uses O2 at night).She understands she must have transportation. Previsit scheduled for Monday, 03-17-18 at 10:00am.

## 2018-03-05 NOTE — Telephone Encounter (Signed)
Pt called back and she can NOT move up to the earlier slot due to her transportation.

## 2018-03-05 NOTE — Telephone Encounter (Signed)
Called and LM for pt to see if she can move up her time on Dr. Doyne Keel hospital schedule for 03-31-18 to the 9:15 slot (would need to arrive at 7:45am - 1 1/2 hours prior). Asked her to call me as soon as possible to confirm or decline

## 2018-03-13 NOTE — Addendum Note (Signed)
Addended by: Rutherford Guys on: 03/13/2018 07:37 PM   Modules accepted: Orders

## 2018-03-17 ENCOUNTER — Ambulatory Visit (AMBULATORY_SURGERY_CENTER): Payer: Self-pay | Admitting: *Deleted

## 2018-03-17 VITALS — Ht 66.0 in | Wt 328.0 lb

## 2018-03-17 DIAGNOSIS — Z1211 Encounter for screening for malignant neoplasm of colon: Secondary | ICD-10-CM

## 2018-03-17 MED ORDER — PEG-KCL-NACL-NASULF-NA ASC-C 140 G PO SOLR: 1.0000 | Freq: Once | ORAL | 0 refills | Status: AC

## 2018-03-17 NOTE — Progress Notes (Signed)
Patient denies any allergies to eggs or soy. Patient denies any problems with anesthesia/sedation. Patient does use oxygen at home. Patient denies taking any diet/weight loss medications or blood thinners. plenvu sample given to pt.

## 2018-03-27 ENCOUNTER — Telehealth: Payer: Self-pay | Admitting: *Deleted

## 2018-03-27 NOTE — Telephone Encounter (Signed)
Please inform patient that her appointment was cancelled on 03/28/2018 fo AWV.  Due to insurance changes with [facility], the docotors have to do AWV nos.   Dr. Nolon Rod will incorporate this appointment in when she comes in to see her again.

## 2018-03-30 NOTE — Anesthesia Preprocedure Evaluation (Addendum)
Anesthesia Evaluation  Patient identified by MRN, date of birth, ID band Patient awake    Reviewed: Allergy & Precautions, NPO status , Patient's Chart, lab work & pertinent test results  Airway Mallampati: II  TM Distance: >3 FB Neck ROM: Full    Dental no notable dental hx.    Pulmonary neg pulmonary ROS, asthma , sleep apnea , COPD, former smoker,    Pulmonary exam normal breath sounds clear to auscultation       Cardiovascular hypertension, Pt. on medications negative cardio ROS Normal cardiovascular exam Rhythm:Regular Rate:Normal     Neuro/Psych negative neurological ROS  negative psych ROS   GI/Hepatic negative GI ROS, Neg liver ROS,   Endo/Other  negative endocrine ROSdiabetes, Type 1Morbid obesity  Renal/GU negative Renal ROS  negative genitourinary   Musculoskeletal negative musculoskeletal ROS (+) Arthritis ,   Abdominal (+) + obese,   Peds negative pediatric ROS (+)  Hematology negative hematology ROS (+)   Anesthesia Other Findings   Reproductive/Obstetrics negative OB ROS                            Anesthesia Physical Anesthesia Plan  ASA: IV  Anesthesia Plan: MAC   Post-op Pain Management:    Induction: Intravenous  PONV Risk Score and Plan:   Airway Management Planned: Natural Airway and Nasal Cannula  Additional Equipment:   Intra-op Plan:   Post-operative Plan:   Informed Consent:     Dental advisory given  Plan Discussed with:   Anesthesia Plan Comments:         Anesthesia Quick Evaluation

## 2018-03-31 ENCOUNTER — Ambulatory Visit (HOSPITAL_COMMUNITY): Payer: Medicare Other | Admitting: Anesthesiology

## 2018-03-31 ENCOUNTER — Encounter (HOSPITAL_COMMUNITY): Payer: Self-pay | Admitting: *Deleted

## 2018-03-31 ENCOUNTER — Ambulatory Visit (HOSPITAL_COMMUNITY)
Admission: RE | Admit: 2018-03-31 | Discharge: 2018-03-31 | Disposition: A | Discharge status: Home / Self Care | Payer: Medicare Other | Attending: Gastroenterology | Admitting: Gastroenterology

## 2018-03-31 ENCOUNTER — Other Ambulatory Visit: Payer: Self-pay

## 2018-03-31 ENCOUNTER — Encounter (HOSPITAL_COMMUNITY)
Admission: RE | Disposition: A | Discharge status: Home / Self Care | Payer: Self-pay | Source: Home / Self Care | Attending: Gastroenterology

## 2018-03-31 DIAGNOSIS — E109 Type 1 diabetes mellitus without complications: Secondary | ICD-10-CM | POA: Insufficient documentation

## 2018-03-31 DIAGNOSIS — Z7982 Long term (current) use of aspirin: Secondary | ICD-10-CM | POA: Diagnosis not present

## 2018-03-31 DIAGNOSIS — Z7984 Long term (current) use of oral hypoglycemic drugs: Secondary | ICD-10-CM | POA: Diagnosis not present

## 2018-03-31 DIAGNOSIS — D127 Benign neoplasm of rectosigmoid junction: Secondary | ICD-10-CM | POA: Diagnosis not present

## 2018-03-31 DIAGNOSIS — Z9981 Dependence on supplemental oxygen: Secondary | ICD-10-CM | POA: Diagnosis not present

## 2018-03-31 DIAGNOSIS — K5730 Diverticulosis of large intestine without perforation or abscess without bleeding: Secondary | ICD-10-CM | POA: Insufficient documentation

## 2018-03-31 DIAGNOSIS — I10 Essential (primary) hypertension: Secondary | ICD-10-CM | POA: Insufficient documentation

## 2018-03-31 DIAGNOSIS — Z87891 Personal history of nicotine dependence: Secondary | ICD-10-CM | POA: Insufficient documentation

## 2018-03-31 DIAGNOSIS — Z1211 Encounter for screening for malignant neoplasm of colon: Secondary | ICD-10-CM | POA: Insufficient documentation

## 2018-03-31 DIAGNOSIS — K5900 Constipation, unspecified: Secondary | ICD-10-CM | POA: Diagnosis not present

## 2018-03-31 DIAGNOSIS — J449 Chronic obstructive pulmonary disease, unspecified: Secondary | ICD-10-CM | POA: Diagnosis not present

## 2018-03-31 DIAGNOSIS — D126 Benign neoplasm of colon, unspecified: Secondary | ICD-10-CM

## 2018-03-31 DIAGNOSIS — E785 Hyperlipidemia, unspecified: Secondary | ICD-10-CM | POA: Insufficient documentation

## 2018-03-31 DIAGNOSIS — G4733 Obstructive sleep apnea (adult) (pediatric): Secondary | ICD-10-CM | POA: Diagnosis not present

## 2018-03-31 DIAGNOSIS — Z1212 Encounter for screening for malignant neoplasm of rectum: Secondary | ICD-10-CM

## 2018-03-31 DIAGNOSIS — K648 Other hemorrhoids: Secondary | ICD-10-CM | POA: Insufficient documentation

## 2018-03-31 DIAGNOSIS — Z6841 Body Mass Index (BMI) 40.0 and over, adult: Secondary | ICD-10-CM | POA: Diagnosis not present

## 2018-03-31 DIAGNOSIS — Z79899 Other long term (current) drug therapy: Secondary | ICD-10-CM | POA: Insufficient documentation

## 2018-03-31 DIAGNOSIS — M1990 Unspecified osteoarthritis, unspecified site: Secondary | ICD-10-CM | POA: Insufficient documentation

## 2018-03-31 HISTORY — PX: POLYPECTOMY: SHX5525

## 2018-03-31 HISTORY — PX: COLONOSCOPY WITH PROPOFOL: SHX5780

## 2018-03-31 LAB — GLUCOSE, CAPILLARY: Glucose-Capillary: 142 mg/dL — ABNORMAL HIGH (ref 70–99)

## 2018-03-31 SURGERY — COLONOSCOPY WITH PROPOFOL
Anesthesia: Monitor Anesthesia Care

## 2018-03-31 MED ORDER — PROPOFOL 10 MG/ML IV BOLUS
INTRAVENOUS | Status: AC
  Filled 2018-03-31: qty 60

## 2018-03-31 MED ORDER — PROPOFOL 10 MG/ML IV BOLUS
INTRAVENOUS | Status: AC
  Filled 2018-03-31: qty 20

## 2018-03-31 MED ORDER — LACTATED RINGERS IV SOLN
INTRAVENOUS | Status: DC
  Administered 2018-03-31: 1000 mL via INTRAVENOUS

## 2018-03-31 MED ORDER — PROPOFOL 500 MG/50ML IV EMUL
INTRAVENOUS | Status: DC | PRN
  Administered 2018-03-31: 150 ug/kg/min via INTRAVENOUS

## 2018-03-31 MED ORDER — PROPOFOL 10 MG/ML IV BOLUS
INTRAVENOUS | Status: DC | PRN
  Administered 2018-03-31: 20 mg via INTRAVENOUS

## 2018-03-31 MED ORDER — SODIUM CHLORIDE 0.9 % IV SOLN: INTRAVENOUS | Status: DC

## 2018-03-31 SURGICAL SUPPLY — 22 items

## 2018-03-31 NOTE — Op Note (Signed)
[facility] Patient Name: Christine Parrish Procedure Date: 03/31/2018 MRN: [id number] Attending MD: Carlota Raspberry. [name] , MD Date of Birth: [birth year] CSN: [id number] Age: 73 Admit Type: Outpatient Procedure:                Colonoscopy Indications:              Screening for colorectal malignant neoplasm Providers:                Christine Lipps P. Havery Moros, MD, Christine Parrish,                            RN, William Dalton, Technician Referring MD:              Medicines:                Monitored Anesthesia Care Complications:            No immediate complications. Estimated blood loss:                            Minimal. Estimated Blood Loss:     Estimated blood loss was minimal. Procedure:                Pre-Anesthesia Assessment:                           - Prior to the procedure, a History and Physical                            was performed, and patient medications and                            allergies were reviewed. The patient's tolerance of                            previous anesthesia was also reviewed. The risks                            and benefits of the procedure and the sedation                            options and risks were discussed with the patient.                            All questions were answered, and informed consent                            was obtained. Prior Anticoagulants: The patient has                            taken no previous anticoagulant or antiplatelet                            agents. ASA Grade Assessment: III - A patient with  severe systemic disease. After reviewing the risks                            and benefits, the patient was deemed in                            satisfactory condition to undergo the procedure.                           After obtaining informed consent, the colonoscope                            was passed under direct vision. Throughout the   procedure, the patient's blood pressure, pulse, and                            oxygen saturations were monitored continuously. The                            CF-HQ190L ([id number]) Olympus colonoscope was                            introduced through the anus and advanced to the the                            cecum, identified by appendiceal orifice and                            ileocecal valve. The colonoscopy was performed                            without difficulty. The patient tolerated the                            procedure well. The quality of the bowel                            preparation was good. The ileocecal valve,                            appendiceal orifice, and rectum were photographed. Scope In: 11:08:32 AM Scope Out: 11:33:12 AM Scope Withdrawal Time: 0 hours 17 minutes 24 seconds  Total Procedure Duration: 0 hours 24 minutes 40 seconds  Findings:      The perianal and digital rectal examinations were normal.      A 4 mm polyp was found in the recto-sigmoid colon. The polyp was       sessile. The polyp was removed with a cold snare. Resection and       retrieval were complete.      Many small-mouthed diverticula were found in the left colon.      The colon was long and redundant.      Internal hemorrhoids and hypertrophied anal papillae were found during       retroflexion.      The exam was otherwise without abnormality. Impression:               -  One 4 mm polyp at the recto-sigmoid colon,                            removed with a cold snare. Resected and retrieved.                           - Diverticulosis in the left colon.                           - Redundant colon.                           - Internal hemorrhoids with hypertrophied anal                            papillae.                           - The examination was otherwise normal. Moderate Sedation:      No moderate sedation, case performed with MAC Recommendation:           - Patient has a  contact number available for                            emergencies. The signs and symptoms of potential                            delayed complications were discussed with the                            patient. Return to normal activities tomorrow.                            Written discharge instructions were provided to the                            patient.                           - Resume previous diet.                           - Continue present medications.                           - Await pathology results. Procedure Code(s):        --- Professional ---                           [id number], Colonoscopy, flexible; with removal of                            tumor(s), polyp(s), or other lesion(s) by snare                            technique Diagnosis Code(s):        --- Professional ---  Z12.11, Encounter for screening for malignant                            neoplasm of colon                           D12.7, Benign neoplasm of rectosigmoid junction                           K64.8, Other hemorrhoids                           K57.30, Diverticulosis of large intestine without                            perforation or abscess without bleeding                           Q43.8, Other specified congenital malformations of                            intestine CPT copyright 2018 American Medical Association. All rights reserved. The codes documented in this report are preliminary and upon coder review may  be revised to meet current compliance requirements. Christine Lipps P. Hayle Parisi, MD 03/31/2018 11:38:20 AM This report has been signed electronically. Number of Addenda: 0

## 2018-03-31 NOTE — H&P (Signed)
HPI:   Christine Parrish is a 73 y.o. female with a history of morbid obesity, OSA, oxygen requirement, here for screening colonoscopy. BAseline constipation. Otherwise no complaints. Case done at hospital due to anesthesia needs.  Past Medical History:  Diagnosis Date  . Arthritis   . Asthma   . Cancer (HCC)    Cervical  . COPD (chronic obstructive pulmonary disease) ([place])   . DM (diabetes mellitus) ([place])   . Hyperlipidemia   . Hypertension   . Hypoxemia 09/25/2013  . Oxygen deficiency   . Sleep apnea    No CPAP (broken)    Past Surgical History:  Procedure Laterality Date  . ABDOMINAL HYSTERECTOMY    . COLONOSCOPY  2000?   Dr.Johnson  . EYE SURGERY    . TUBAL LIGATION      Family History  Problem Relation Age of Onset  . Emphysema Mother        never smoker  . CAD Mother 19  . Asthma Mother   . Colon polyps Mother   . Diabetes Father   . CAD Brother 49  . Asthma Daughter   . Asthma Sister   . Colon cancer Cousin        77's  . Esophageal cancer Neg Hx   . Rectal cancer Neg Hx   . Stomach cancer Neg Hx      Social History   Tobacco Use  . Smoking status: Former Smoker    Packs/day: 1.50    Years: 35.00    Pack years: 52.50    Types: Cigarettes    Last attempt to quit: 01/23/2003    Years since quitting: 15.1  . Smokeless tobacco: Never Used  Substance Use Topics  . Alcohol use: Not Currently  . Drug use: No    Prior to Admission medications   Medication Sig Start Date End Date Taking? Authorizing Provider  acetaminophen (TYLENOL) 500 MG tablet Take 1,000 mg by mouth every 6 (six) hours as needed for moderate pain.   Yes [provider]  aspirin 81 MG tablet Take 81 mg by mouth daily.   Yes [provider]  atorvastatin (LIPITOR) 20 MG tablet Take 1 tablet (20 mg total) by mouth daily. 11/25/17  Yes Rutherford Guys, MD  blood glucose meter kit and supplies Per insurance preference. Use to check blood glucose once a  day. Dx E 11.22 02/25/18  Yes Rutherford Guys, MD  diphenhydrAMINE (SOMINEX) 25 MG tablet Take 25-50 mg by mouth at bedtime as needed for sleep.   Yes [provider]  fluticasone (FLONASE) 50 MCG/ACT nasal spray Place 1 spray into both nostrils daily as needed for allergies or rhinitis.   Yes [provider]  hydrochlorothiazide (HYDRODIURIL) 25 MG tablet Take 1 tablet (25 mg total) by mouth daily. 11/25/17  Yes Rutherford Guys, MD  loratadine (CLARITIN) 10 MG tablet Take 10 mg by mouth daily as needed for allergies.   Yes [provider]  metFORMIN (GLUCOPHAGE) 500 MG tablet Take 1 tablet (500 mg total) by mouth 2 (two) times daily with a meal. 11/25/17  Yes Rutherford Guys, MD  nystatin cream (MYCOSTATIN) Apply 1 application topically 2 (two) times daily. Patient taking differently: Apply 1 application topically 2 (two) times daily as needed (yeast infection).  11/25/17  Yes Rutherford Guys, MD  OXYGEN Inhale 2 L into the lungs as needed. At home  Yes [provider]  Pramoxine-Camphor-Zinc Acetate (ANTI ITCH EX) Apply 1 application topically daily as needed (itching).   Yes [provider]  ranitidine (ZANTAC) 150 MG capsule Take 150 mg by mouth daily as needed for heartburn.   Yes [provider]  albuterol (PROVENTIL) (2.5 MG/3ML) 0.083% nebulizer solution Take 3 mLs (2.5 mg total) by nebulization every 6 (six) hours as needed for wheezing or shortness of breath. 02/25/18   Rutherford Guys, MD    Current Facility-Administered Medications  Medication Dose Route Frequency Provider Last Rate Last Dose  . 0.9 %  sodium chloride infusion   Intravenous Continuous Alesandra Smart, Carlota Raspberry, MD      . lactated ringers infusion   Intravenous Continuous Macaela Presas, Carlota Raspberry, MD 10 mL/hr at 03/31/18 1038 1,000 mL at 03/31/18 1038   Facility-Administered Medications Ordered in Other Encounters  Medication Dose Route Frequency Provider Last Rate Last Dose    . propofol (DIPRIVAN) 10 mg/mL bolus/IV push   Intravenous Anesthesia Intra-op Lind Covert, CRNA   20 mg at 03/31/18 1101  . propofol (DIPRIVAN) 500 MG/50ML infusion    Continuous PRN Lind Covert, CRNA 132.7 mL/hr at 03/31/18 1100 150 mcg/kg/min at 03/31/18 1100    Allergies as of 02/26/2018 - Review Complete 02/25/2018  Allergen Reaction Noted  . Lisinopril Cough 04/05/2015  . Losartan Swelling 02/25/2018     Review of Systems:    As per HPI, otherwise negative    Physical Exam:  Vital signs in last 24 hours: Temp:  [97.7 F (36.5 C)] 97.7 F (36.5 C) (03/09 1022) Pulse Rate:  [110] 110 (03/09 1022) Resp:  [18] 18 (03/09 1022) BP: (168)/(100) 168/100 (03/09 1022) SpO2:  [94 %] 94 % (03/09 1022) Weight:  [147.4 kg] 147.4 kg (03/09 1022)   General:   Pleasant female in NAD Lungs:  Respirations even and unlabored.  Heart:  Regular rate and rhythm; no MRG Abdomen:  Soft, nondistended, nontender.  Lab Results  Component Value Date   WBC 9.8 04/04/2017   HGB 12.8 04/04/2017   HCT 38.6 04/04/2017   MCV 82 04/04/2017   PLT 305 04/04/2017    Lab Results  Component Value Date   CREATININE 1.56 (H) 02/25/2018   BUN 41 (H) 02/25/2018   NA 140 02/25/2018   K 3.8 02/25/2018   CL 101 02/25/2018   CO2 19 (L) 02/25/2018    Lab Results  Component Value Date   ALT 7 02/25/2018   AST 8 02/25/2018   ALKPHOS 99 02/25/2018   BILITOT <0.2 02/25/2018      Impression / Plan:   73 y/o female here for screening colonoscopy, done at the hospital for anethesia support due to oxygen requirements and weight. I have discussed risks / benefits with the patient and she wishes to proceed.   Corning Cellar, MD [facility]

## 2018-03-31 NOTE — Anesthesia Postprocedure Evaluation (Signed)
Anesthesia Post Note  Patient: Christine Parrish  Procedure(s) Performed: COLONOSCOPY WITH PROPOFOL (N/A )     Patient location during evaluation: Endoscopy Anesthesia Type: MAC Level of consciousness: awake and alert Pain management: pain level controlled Vital Signs Assessment: post-procedure vital signs reviewed and stable Respiratory status: spontaneous breathing, nonlabored ventilation, respiratory function stable and patient connected to nasal cannula oxygen Cardiovascular status: blood pressure returned to baseline and stable Postop Assessment: no apparent nausea or vomiting Anesthetic complications: no    Last Vitals:  Vitals:   03/31/18 1149 03/31/18 1201  BP: (!) 174/100 138/79  Pulse: (!) 108 96  Resp: 19 18  Temp:    SpO2: 94% 97%    Last Pain:  Vitals:   03/31/18 1201  TempSrc:   PainSc: 0-No pain                 Barnet Glasgow

## 2018-03-31 NOTE — Discharge Instructions (Signed)
Colonoscopy, Adult, Care After °This sheet gives you information about how to care for yourself after your procedure. Your doctor may also give you more specific instructions. If you have problems or questions, call your doctor. °What can I expect after the procedure? °After the procedure, it is common to have: °· A small amount of blood in your poop for 24 hours. °· Some gas. °· Mild cramping or bloating in your belly. °Follow these instructions at home: °General instructions °· For the first 24 hours after the procedure: °? Do not drive or use machinery. °? Do not sign important documents. °? Do not drink alcohol. °? Do your daily activities more slowly than normal. °? Eat foods that are soft and easy to digest. °· Take over-the-counter or prescription medicines only as told by your doctor. °To help cramping and bloating: ° °· Try walking around. °· Put heat on your belly (abdomen) as told by your doctor. Use a heat source that your doctor recommends, such as a moist heat pack or a heating pad. °? Put a towel between your skin and the heat source. °? Leave the heat on for 20-30 minutes. °? Remove the heat if your skin turns bright red. This is especially important if you cannot feel pain, heat, or cold. You can get burned. °Eating and drinking ° °· Drink enough fluid to keep your pee (urine) clear or pale yellow. °· Return to your normal diet as told by your doctor. Avoid heavy or fried foods that are hard to digest. °· Avoid drinking alcohol for as long as told by your doctor. °Contact a doctor if: °· You have blood in your poop (stool) 2-3 days after the procedure. °Get help right away if: °· You have more than a small amount of blood in your poop. °· You see large clumps of tissue (blood clots) in your poop. °· Your belly is swollen. °· You feel sick to your stomach (nauseous). °· You throw up (vomit). °· You have a fever. °· You have belly pain that gets worse, and medicine does not help your  pain. °Summary °· After the procedure, it is common to have a small amount of blood in your poop. You may also have mild cramping and bloating in your belly. °· For the first 24 hours after the procedure, do not drive or use machinery, do not sign important documents, and do not drink alcohol. °· Get help right away if you have a lot of blood in your poop, feel sick to your stomach, have a fever, or have more belly pain. °This information is not intended to replace advice given to you by your health care provider. Make sure you discuss any questions you have with your health care provider. °Document Released: 02/10/2010 Document Revised: 11/08/2016 Document Reviewed: 10/03/2015 °Elsevier Interactive Patient Education © 2019 Elsevier Inc. ° °

## 2018-03-31 NOTE — Transfer of Care (Signed)
Immediate Anesthesia Transfer of Care Note  Patient: JESI JURGENS  Procedure(s) Performed: COLONOSCOPY WITH PROPOFOL (N/A )  Patient Location: PACU  Anesthesia Type:MAC  Level of Consciousness: sedated  Airway & Oxygen Therapy: Patient Spontanous Breathing and Patient connected to nasal cannula oxygen  Post-op Assessment: Report given to RN and Post -op Vital signs reviewed and stable  Post vital signs: Reviewed and stable  Last Vitals:  Vitals Value Taken Time  BP    Temp    Pulse 110 03/31/2018 11:38 AM  Resp 22 03/31/2018 11:38 AM  SpO2 91 % 03/31/2018 11:38 AM  Vitals shown include unvalidated device data.  Last Pain:  Vitals:   03/31/18 1022  TempSrc: Oral  PainSc: 0-No pain         Complications: No apparent anesthesia complications

## 2018-03-31 NOTE — Interval H&P Note (Signed)
History and Physical Interval Note:  03/31/2018 11:05 AM  Christine Parrish  has presented today for surgery, with the diagnosis of colon ca screening/O2 use/jmh.  The various methods of treatment have been discussed with the patient and family. After consideration of risks, benefits and other options for treatment, the patient has consented to  Procedure(s): COLONOSCOPY WITH PROPOFOL (N/A) as a surgical intervention.  The patient's history has been reviewed, patient examined, no change in status, stable for surgery.  I have reviewed the patient's chart and labs.  Questions were answered to the patient's satisfaction.     [place]

## 2018-04-01 ENCOUNTER — Ambulatory Visit: Payer: Self-pay

## 2018-04-01 ENCOUNTER — Encounter (HOSPITAL_COMMUNITY): Payer: Self-pay | Admitting: Gastroenterology

## 2018-04-02 ENCOUNTER — Encounter: Payer: Self-pay | Admitting: Gastroenterology

## 2018-04-23 ENCOUNTER — Telehealth: Payer: Medicare Other | Admitting: Family Medicine

## 2018-04-23 ENCOUNTER — Ambulatory Visit: Payer: Self-pay

## 2018-04-23 ENCOUNTER — Other Ambulatory Visit: Payer: Self-pay

## 2018-04-23 ENCOUNTER — Telehealth (INDEPENDENT_AMBULATORY_CARE_PROVIDER_SITE_OTHER): Payer: Medicare Other | Admitting: Family Medicine

## 2018-04-23 DIAGNOSIS — M25571 Pain in right ankle and joints of right foot: Secondary | ICD-10-CM

## 2018-04-23 DIAGNOSIS — L03119 Cellulitis of unspecified part of limb: Secondary | ICD-10-CM

## 2018-04-23 MED ORDER — CEPHALEXIN 500 MG PO CAPS: 500.0000 mg | ORAL_CAPSULE | Freq: Two times a day (BID) | ORAL | 0 refills | Status: DC

## 2018-04-23 MED ORDER — TRAMADOL HCL 50 MG PO TABS: 50.0000 mg | ORAL_TABLET | Freq: Three times a day (TID) | ORAL | 0 refills | Status: DC | PRN

## 2018-04-23 NOTE — Progress Notes (Signed)
Virtual Visit via Telephone Note  I connected with Christine Parrish on 04/23/18 at 4:31 PM by telephone and verified that I am speaking with the correct person using two identifiers.   I discussed the limitations, risks, security and privacy concerns of performing an evaluation and management service by telephone and the availability of in person appointments. I also discussed with the patient that there may be a patient responsible charge related to this service. The patient expressed understanding and agreed to proceed, consent obtained  Chief complaint: Foot pain  History of Present Illness:   R foot pain.  Started 9 days ago.  NKI. Started with pain at great toe, and little toe, and then into top of foot. More swelling past few days. Trouble with weight bear. Having to use a walker.  Feels warm from leg to knee, but not now - improved after tylenol. no measured fever. Was hot and sweaty last night - sweats last night - on and off in past as well - not new.  No cough, no dyspnea.  No hx of DVT. No calf pain.  No hx of gout.  Pain about the same - sore after tylenol wears off - taking multiple times per day.  Redness on foot past week as well.  started after had colonoscopy.        Patient Active Problem List   Diagnosis Date Noted  . Screening for colorectal cancer   . Requires supplemental oxygen   . Benign neoplasm of colon   . Nontraumatic pain of left shoulder 04/04/2017  . Impaired mobility and activities of daily living 04/04/2017  . OSA (obstructive sleep apnea) 09/25/2013  . Obesity, Class III, BMI 40-49.9 (morbid obesity) ([place]) 09/25/2013  . Snoring 09/25/2013  . DM (diabetes mellitus) 06/10/2013  . Severe obesity (BMI >= 40) ([place]) 06/10/2013  . Cervical neuropathic pain 05/24/2012  . SARCOIDOSIS 12/02/2007  . Essential hypertension 12/02/2007  . ASTHMA 12/02/2007  . SLEEP APNEA 12/02/2007  . CERVICAL CANCER, HX OF 12/02/2007   Past Medical History:   Diagnosis Date  . Arthritis   . Asthma   . Cancer (HCC)    Cervical  . COPD (chronic obstructive pulmonary disease) ([place])   . DM (diabetes mellitus) ([place])   . Hyperlipidemia   . Hypertension   . Hypoxemia 09/25/2013  . Oxygen deficiency   . Sleep apnea    No CPAP (broken)   Past Surgical History:  Procedure Laterality Date  . ABDOMINAL HYSTERECTOMY    . COLONOSCOPY  2000?   Dr.Johnson  . COLONOSCOPY WITH PROPOFOL N/A 03/31/2018   Procedure: COLONOSCOPY WITH PROPOFOL;  Surgeon: Yetta Flock, MD;  Location: [facility];  Service: Gastroenterology;  Laterality: N/A;  . EYE SURGERY    . POLYPECTOMY  03/31/2018   Procedure: POLYPECTOMY;  Surgeon: Yetta Flock, MD;  Location: [facility];  Service: Gastroenterology;;  . TUBAL LIGATION     Allergies  Allergen Reactions  . Lisinopril Cough  . Losartan Swelling   Prior to Admission medications   Medication Sig Start Date End Date Taking? Authorizing Provider  acetaminophen (TYLENOL) 500 MG tablet Take 1,000 mg by mouth every 6 (six) hours as needed for moderate pain.   Yes [provider]  aspirin 81 MG tablet Take 81 mg by mouth daily.   Yes [provider]  atorvastatin (LIPITOR) 20 MG tablet Take 1 tablet (20 mg total) by mouth daily. 11/25/17  Yes Rutherford Guys, MD  blood glucose  meter kit and supplies Per insurance preference. Use to check blood glucose once a day. Dx E 11.22 02/25/18  Yes Rutherford Guys, MD  diphenhydrAMINE (SOMINEX) 25 MG tablet Take 25-50 mg by mouth at bedtime as needed for sleep.   Yes [provider]  loratadine (CLARITIN) 10 MG tablet Take 10 mg by mouth daily as needed for allergies.   Yes [provider]  metFORMIN (GLUCOPHAGE) 500 MG tablet Take 1 tablet (500 mg total) by mouth 2 (two) times daily with a meal. 11/25/17  Yes Rutherford Guys, MD  nystatin cream (MYCOSTATIN) Apply 1 application topically 2 (two) times daily. Patient taking differently:  Apply 1 application topically 2 (two) times daily as needed (yeast infection).  11/25/17  Yes Rutherford Guys, MD  OXYGEN Inhale 2 L into the lungs as needed. At home   Yes [provider]  albuterol (PROVENTIL) (2.5 MG/3ML) 0.083% nebulizer solution Take 3 mLs (2.5 mg total) by nebulization every 6 (six) hours as needed for wheezing or shortness of breath. Patient not taking: Reported on 04/23/2018 02/25/18   Rutherford Guys, MD  fluticasone [facility]) 50 MCG/ACT nasal spray Place 1 spray into both nostrils daily as needed for allergies or rhinitis.    [provider]  hydrochlorothiazide (HYDRODIURIL) 25 MG tablet Take 1 tablet (25 mg total) by mouth daily. 11/25/17   Rutherford Guys, MD  Pramoxine-Camphor-Zinc Acetate (ANTI [facility] EX) Apply 1 application topically daily as needed (itching).    [provider]  ranitidine (ZANTAC) 150 MG capsule Take 150 mg by mouth daily as needed for heartburn.    [provider]   Social History   Socioeconomic History  . Marital status: Widowed    Spouse name: Not on file  . Number of children: 7  . Years of education: 31  . Highest education level: Not on file  Occupational History  . Occupation: Retired  Scientific laboratory technician  . Financial resource strain: Not on file  . Food insecurity:    Worry: Not on file    Inability: Not on file  . Transportation needs:    Medical: Not on file    Non-medical: Not on file  Tobacco Use  . Smoking status: Former Smoker    Packs/day: 1.50    Years: 35.00    Pack years: 52.50    Types: Cigarettes    Last attempt to quit: 01/23/2003    Years since quitting: 15.2  . Smokeless tobacco: Never Used  Substance and Sexual Activity  . Alcohol use: Not Currently  . Drug use: No  . Sexual activity: Never  Lifestyle  . Physical activity:    Days per week: Not on file    Minutes per session: Not on file  . Stress: Not on file  Relationships  . Social connections:    Talks on phone: Not on  file    Gets together: Not on file    Attends religious service: Not on file    Active member of club or organization: Not on file    Attends meetings of clubs or organizations: Not on file    Relationship status: Not on file  . Intimate partner violence:    Fear of current or ex partner: Not on file    Emotionally abused: Not on file    Physically abused: Not on file    Forced sexual activity: Not on file  Other Topics Concern  . Not on file  Social History Narrative  Patient is single and her daughter lives with her.   Patient has six living children and one child is deceased.   Patient is retired.   Patient has a 10th grade education.   Patient is right-handed.   Patient drinks three cans of soda daily.           Observations/Objective: Initial history through telephone, then switched to face time: Daughter present to assist with camera: right foot: erythema across dorsal foot, noted tenderness to palpation with light touch from assistance of daughter to the dorsum of her foot, slightly to the lateral foot and posterior ankle, not tender at the calf or Achilles.  Additionally not tender or significant erythema/edema at the first MTP.  No wounds were visualized.   Assessment and Plan: Pain, joint, foot, right - Plan: cephALEXin (KEFLEX) 500 MG capsule, traMADol (ULTRAM) 50 MG tablet  Cellulitis of foot - Plan: cephALEXin (KEFLEX) 500 MG capsule, traMADol (ULTRAM) 50 MG tablet  Although the limitations of the exam over a phone/face time were discussed, based on what we gathered above it does appear to be possible cellulitis.  Denies systemic symptoms at this time that are new, will try initial outpatient treatment  -Keflex 500 mg twice daily, tramadol if needed for pain, potential side effects and risk discussed.    -Follow-up visit by tele-med/FaceTime/Webex in the next 24 to 48 hours with ER precautions given if worsening symptoms.  Understanding expressed.  Follow Up  Instructions:  Patient Instructions   Foot pain appears to be due to a skin infection or cellulitis.  Start antibiotic twice per day, pain medication up to every 8 hours as needed.  If not bad pain can take Tylenol instead.  Recheck in the next 24 to 48 hours with myself or Dr. Pamella Pert.  If any measured fevers, shaking chills or other worsening symptoms be seen sooner or the emergency room.   Cellulitis, Adult  Cellulitis is a skin infection. The infected area is usually warm, red, swollen, and tender. This condition occurs most often in the arms and lower legs. The infection can travel to the muscles, blood, and underlying tissue and become serious. It is very important to get treated for this condition. What are the causes? Cellulitis is caused by bacteria. The bacteria enter through a break in the skin, such as a cut, burn, insect bite, open sore, or crack. What increases the risk? This condition is more likely to occur in people who:  Have a weak body defense system (immune system).  Have open wounds on the skin, such as cuts, burns, bites, and scrapes. Bacteria can enter the body through these open wounds.  Are older than 73 years of age.  Have diabetes.  Have a type of long-lasting (chronic) liver disease (cirrhosis) or kidney disease.  Are obese.  Have a skin condition such as: ? Itchy rash (eczema). ? Slow movement of blood in the veins (venous stasis). ? Fluid buildup below the skin (edema).  Have had radiation therapy.  Use IV drugs. What are the signs or symptoms? Symptoms of this condition include:  Redness, streaking, or spotting on the skin.  Swollen area of the skin.  Tenderness or pain when an area of the skin is touched.  Warm skin.  A fever.  Chills.  Blisters. How is this diagnosed? This condition is diagnosed based on a medical history and physical exam. You may also have tests, including:  Blood tests.  Imaging tests. How is this treated?  Treatment  for this condition may include:  Medicines, such as antibiotic medicines or medicines to treat allergies (antihistamines).  Supportive care, such as rest and application of cold or warm cloths (compresses) to the skin.  Hospital care, if the condition is severe. The infection usually starts to get better within 1-2 days of treatment. Follow these instructions at home:  Medicines  Take over-the-counter and prescription medicines only as told by your health care provider.  If you were prescribed an antibiotic medicine, take it as told by your health care provider. Do not stop taking the antibiotic even if you start to feel better. General instructions  Drink enough fluid to keep your urine pale yellow.  Do not touch or rub the infected area.  Raise (elevate) the infected area above the level of your heart while you are sitting or lying down.  Apply warm or cold compresses to the affected area as told by your health care provider.  Keep all follow-up visits as told by your health care provider. This is important. These visits let your health care provider make sure a more serious infection is not developing. Contact a health care provider if:  You have a fever.  Your symptoms do not begin to improve within 1-2 days of starting treatment.  Your bone or joint underneath the infected area becomes painful after the skin has healed.  Your infection returns in the same area or another area.  You notice a swollen bump in the infected area.  You develop new symptoms.  You have a general ill feeling (malaise) with muscle aches and pains. Get help right away if:  Your symptoms get worse.  You feel very sleepy.  You develop vomiting or diarrhea that persists.  You notice red streaks coming from the infected area.  Your red area gets larger or turns dark in color. These symptoms may represent a serious problem that is an emergency. Do not wait to see if the symptoms  will go away. Get medical help right away. Call your local emergency services (911 in the [place] Do not drive yourself to the hospital. Summary  Cellulitis is a skin infection. This condition occurs most often in the arms and lower legs.  Treatment for this condition may include medicines, such as antibiotic medicines or antihistamines.  Take over-the-counter and prescription medicines only as told by your health care provider. If you were prescribed an antibiotic medicine, do not stop taking the antibiotic even if you start to feel better.  Contact a health care provider if your symptoms do not begin to improve within 1-2 days of starting treatment or your symptoms get worse.  Keep all follow-up visits as told by your health care provider. This is important. These visits let your health care provider make sure that a more serious infection is not developing. This information is not intended to replace advice given to you by your health care provider. Make sure you discuss any questions you have with your health care provider. Document Released: 10/18/2004 Document Revised: 05/30/2017 Document Reviewed: 05/30/2017 Elsevier Interactive Patient Education  [employer].     If you have lab work done today you will be contacted with your lab results within the next 2 weeks.  If you have not heard from [place] then please contact us. The fastest way to get your results is to register for My Chart.   IF you received an x-ray today, you will receive an invoice from [facility]. Please contact [facility] at  [phone] with questions or concerns regarding your invoice.   IF you received labwork today, you will receive an invoice from [facility]. Please contact [facility] at [phone] with questions or concerns regarding your invoice.   Our billing staff will not be able to assist you with questions regarding bills from these companies.  You will be contacted with the lab  results as soon as they are available. The fastest way to get your results is to activate your My Chart account. Instructions are located on the last page of this paperwork. If you have not heard from [place] regarding the results in 2 weeks, please contact this office.          I discussed the assessment and treatment plan with the patient. The patient was provided an opportunity to ask questions and all were answered. The patient agreed with the plan and demonstrated an understanding of the instructions.   The patient was advised to call back or seek an in-person evaluation if the symptoms worsen or if the condition fails to improve as anticipated.  I provided 9 initial minutes, then  60mn "facetime"minutes of non-face-to-face time during this encounter.  Signed,   JMerri Ray MD [facility]  04/23/18

## 2018-04-23 NOTE — Patient Instructions (Addendum)
Foot pain appears to be due to a skin infection or cellulitis.  Start antibiotic twice per day, pain medication up to every 8 hours as needed.  If not bad pain can take Tylenol instead.  Recheck in the next 24 to 48 hours with myself or Dr. Pamella Pert.  If any measured fevers, shaking chills or other worsening symptoms be seen sooner or the emergency room.   Cellulitis, Adult  Cellulitis is a skin infection. The infected area is usually warm, red, swollen, and tender. This condition occurs most often in the arms and lower legs. The infection can travel to the muscles, blood, and underlying tissue and become serious. It is very important to get treated for this condition. What are the causes? Cellulitis is caused by bacteria. The bacteria enter through a break in the skin, such as a cut, burn, insect bite, open sore, or crack. What increases the risk? This condition is more likely to occur in people who:  Have a weak body defense system (immune system).  Have open wounds on the skin, such as cuts, burns, bites, and scrapes. Bacteria can enter the body through these open wounds.  Are older than 73 years of age.  Have diabetes.  Have a type of long-lasting (chronic) liver disease (cirrhosis) or kidney disease.  Are obese.  Have a skin condition such as: ? Itchy rash (eczema). ? Slow movement of blood in the veins (venous stasis). ? Fluid buildup below the skin (edema).  Have had radiation therapy.  Use IV drugs. What are the signs or symptoms? Symptoms of this condition include:  Redness, streaking, or spotting on the skin.  Swollen area of the skin.  Tenderness or pain when an area of the skin is touched.  Warm skin.  A fever.  Chills.  Blisters. How is this diagnosed? This condition is diagnosed based on a medical history and physical exam. You may also have tests, including:  Blood tests.  Imaging tests. How is this treated? Treatment for this condition may  include:  Medicines, such as antibiotic medicines or medicines to treat allergies (antihistamines).  Supportive care, such as rest and application of cold or warm cloths (compresses) to the skin.  Hospital care, if the condition is severe. The infection usually starts to get better within 1-2 days of treatment. Follow these instructions at home:  Medicines  Take over-the-counter and prescription medicines only as told by your health care provider.  If you were prescribed an antibiotic medicine, take it as told by your health care provider. Do not stop taking the antibiotic even if you start to feel better. General instructions  Drink enough fluid to keep your urine pale yellow.  Do not touch or rub the infected area.  Raise (elevate) the infected area above the level of your heart while you are sitting or lying down.  Apply warm or cold compresses to the affected area as told by your health care provider.  Keep all follow-up visits as told by your health care provider. This is important. These visits let your health care provider make sure a more serious infection is not developing. Contact a health care provider if:  You have a fever.  Your symptoms do not begin to improve within 1-2 days of starting treatment.  Your bone or joint underneath the infected area becomes painful after the skin has healed.  Your infection returns in the same area or another area.  You notice a swollen bump in the infected area.  You  develop new symptoms.  You have a general ill feeling (malaise) with muscle aches and pains. Get help right away if:  Your symptoms get worse.  You feel very sleepy.  You develop vomiting or diarrhea that persists.  You notice red streaks coming from the infected area.  Your red area gets larger or turns dark in color. These symptoms may represent a serious problem that is an emergency. Do not wait to see if the symptoms will go away. Get medical help right  away. Call your local emergency services (911 in the [place] Do not drive yourself to the hospital. Summary  Cellulitis is a skin infection. This condition occurs most often in the arms and lower legs.  Treatment for this condition may include medicines, such as antibiotic medicines or antihistamines.  Take over-the-counter and prescription medicines only as told by your health care provider. If you were prescribed an antibiotic medicine, do not stop taking the antibiotic even if you start to feel better.  Contact a health care provider if your symptoms do not begin to improve within 1-2 days of starting treatment or your symptoms get worse.  Keep all follow-up visits as told by your health care provider. This is important. These visits let your health care provider make sure that a more serious infection is not developing. This information is not intended to replace advice given to you by your health care provider. Make sure you discuss any questions you have with your health care provider. Document Released: 10/18/2004 Document Revised: 05/30/2017 Document Reviewed: 05/30/2017 Elsevier Interactive Patient Education  [employer].     If you have lab work done today you will be contacted with your lab results within the next 2 weeks.  If you have not heard from [place] then please contact us. The fastest way to get your results is to register for My Chart.   IF you received an x-ray today, you will receive an invoice from [facility]. Please contact [facility] at [phone] with questions or concerns regarding your invoice.   IF you received labwork today, you will receive an invoice from [place]. Please contact [facility] at [phone] with questions or concerns regarding your invoice.   Our billing staff will not be able to assist you with questions regarding bills from these companies.  You will be contacted with the lab results as soon as they are available. The  fastest way to get your results is to activate your My Chart account. Instructions are located on the last page of this paperwork. If you have not heard from [place] regarding the results in 2 weeks, please contact this office.

## 2018-04-23 NOTE — Telephone Encounter (Signed)
Spoke with pt and she informed pt that she has an appointment today at 4:00pm.

## 2018-04-23 NOTE — Telephone Encounter (Signed)
Returned call to patient who states that she has had a swollen right foot for several days. She states it is so painful that she barely is able to get to the bathroom using a walker.  She states that she has swelling to the foot up to her knee.  She states it is hot.She is taking acetaminophen for pain.  With out pain medication she rates pain at 8-10. After pain medication it is 4-5.  She denies injury.  She states the swelling makes her skin look shinny. She was unable to tell me if it was pitting. She is diabetic and sees no sores or open areas.  Her toes are not blue or discolored. Per practice protocol pt call was transferred to the office [facility] for scheduling.  Care advice was read to patient prior to transfer of the call.  She verbalized understanding.  Reason for Disposition . [1] SEVERE pain (e.g., excruciating, unable to do any normal activities) AND [2] not improved after 2 hours of pain medicine  Answer Assessment - Initial Assessment Questions 1. ONSET: "When did the pain start?"     4-5 after asa normally 8-10 2. LOCATION: "Where is the pain located?"      Right foot 3. PAIN: "How bad is the pain?"    (Scale 1-10; or mild, moderate, severe)   -  MILD (1-3): doesn't interfere with normal activities    -  MODERATE (4-7): interferes with normal activities (e.g., work or school) or awakens from sleep, limping    -  SEVERE (8-10): excruciating pain, unable to do any normal activities, unable to walk    Walk with walker only to bathroom 4. WORK OR EXERCISE: "Has there been any recent work or exercise that involved this part of the body?"      No 5. CAUSE: "What do you think is causing the foot pain?"     Unsure maybe gout 6. OTHER SYMPTOMS: "Do you have any other symptoms?" (e.g., leg pain, rash, fever, numbness)     Swelling fever in foot and leg to knee with redness 7. PREGNANCY: "Is there any chance you are pregnant?" "When was your last menstrual period?"     N/A  Protocols used:  FOOT PAIN-A-AH

## 2018-04-23 NOTE — Progress Notes (Signed)
CC- (Call daughter's for for Web[employer] [phone]) foot soreness and pain. (unable to find out which foot, another doc office called in when I was on the phone with the patient)

## 2018-05-12 ENCOUNTER — Ambulatory Visit: Payer: Medicare Other | Admitting: Registered Nurse

## 2018-07-14 ENCOUNTER — Ambulatory Visit: Payer: Self-pay | Admitting: Family Medicine

## 2018-07-14 NOTE — Telephone Encounter (Signed)
Pt called in c/o cold symptoms .  Runny nose. Coughing up bloody, thick mucus.  No fever.     I warm transferred the call into Primary Care at [facility] to be scheduled due to the COVID-19 pandemic.    Reason for Disposition . [1] Nasal discharge AND [2] present > 10 days  Answer Assessment - Initial Assessment Questions 1. ONSET: "When did the nasal discharge start?"      I was 98.3.   I had chills.   I've been taking cold medicine.   I have diabetes and I'm not supposed to take the OTC medicines but I don't want to come out due to the COVID-19.      I'm spitting up bloody sputum and it's thick.   2. AMOUNT: "How much discharge is there?"      This has been going on for a week. I'm blowing yellow and green mucus out of my nose. 3. COUGH: "Do you have a cough?" If yes, ask: "Describe the color of your sputum" (clear, white, yellow, green)     Yes   Bloody sputum.  The OTC cold medicines are not helping. 4. RESPIRATORY DISTRESS: "Describe your breathing."      I always have shortness of breath so no more than usual. 5. FEVER: "Do you have a fever?" If so, ask: "What is your temperature, how was it measured, and when did it start?"     No 98.3. 6. SEVERITY: "Overall, how bad are you feeling right now?" (e.g., doesn't interfere with normal activities, staying home from school/work, staying in bed)      I have a headache.    I have the A/C and 2 fans running this is normal for me. 7. OTHER SYMPTOMS: "Do you have any other symptoms?" (e.g., sore throat, earache, wheezing, vomiting)     Did have a sore throat but it's gone except for itching.   8. PREGNANCY: "Is there any chance you are pregnant?" "When was your last menstrual period?"     Not asked due to age.  Protocols used: COMMON COLD-A-AH

## 2018-07-16 ENCOUNTER — Encounter: Payer: Self-pay | Admitting: Family Medicine

## 2018-07-16 ENCOUNTER — Other Ambulatory Visit: Payer: Self-pay

## 2018-07-16 ENCOUNTER — Telehealth: Payer: Self-pay | Admitting: Family Medicine

## 2018-07-16 ENCOUNTER — Telehealth (INDEPENDENT_AMBULATORY_CARE_PROVIDER_SITE_OTHER): Payer: Medicare Other | Admitting: Family Medicine

## 2018-07-16 VITALS — Temp 97.3°F | Ht 65.0 in | Wt 324.0 lb

## 2018-07-16 DIAGNOSIS — Z20822 Contact with and (suspected) exposure to covid-19: Secondary | ICD-10-CM

## 2018-07-16 DIAGNOSIS — R05 Cough: Secondary | ICD-10-CM

## 2018-07-16 DIAGNOSIS — R059 Cough, unspecified: Secondary | ICD-10-CM | POA: Insufficient documentation

## 2018-07-16 MED ORDER — BENZONATATE 200 MG PO CAPS: 200.0000 mg | ORAL_CAPSULE | Freq: Two times a day (BID) | ORAL | 0 refills | Status: AC | PRN

## 2018-07-16 MED ORDER — AZITHROMYCIN 250 MG PO TABS: ORAL_TABLET | ORAL | 0 refills | Status: DC

## 2018-07-16 NOTE — Telephone Encounter (Signed)
73yo Cough, fever, fatigue

## 2018-07-16 NOTE — Telephone Encounter (Signed)
Pt has been scheduled for covid testing  Pt was referred [facility], Rex Kras, MD

## 2018-07-16 NOTE — Progress Notes (Signed)
Chief Complaint: X 1 week cough   Pt states dry cough now. Pt states that she had headaches, body aches, fever 4 or 5 days ago but stop yesterday.

## 2018-07-16 NOTE — Addendum Note (Signed)
Addended by: Dimple Nanas on: 07/16/2018 03:32 PM   Modules accepted: Orders

## 2018-07-16 NOTE — Progress Notes (Signed)
Telemedicine Encounter- SOAP NOTE Established Patient  I discussed the limitations, risks, security and privacy concerns of performing an evaluation and management service by telephone and the availability of in person appointments. I also discussed with the patient that there may be a patient responsible charge related to this service. The patient expressed understanding and agreed to proceed.  This telephone encounter was conducted with the patient's  verbal consent via audio telecommunications: yes Patient was instructed to have this encounter in a suitably private space; and to only have persons present to whom they give permission to participate. In addition, patient identity was confirmed by use of name plus two identifiers (DOB and address).  I spent a total of 15 min talking with the patient  Pt states dry cough now. Pt states that she had headaches, body aches, fever 4 or 5 days ago but stop yesterday.   Christine Parrish is a 73 y.o.  established patient. Telephone visit today for cough, congestion, body aches and fever. Pt states no fever today but continued cough. Pt with acute bronchitis in the past-request antibiotics for treatment. Pt with h/o asthma/COPD. Pt has received steroids in the past in addition to the antibiotics. Pt denies wheezing or increase use of albuterol. Pt request cough medication.    Patient Active Problem List   Diagnosis Date Noted  . Screening for colorectal cancer   . Requires supplemental oxygen   . Benign neoplasm of colon   . Nontraumatic pain of left shoulder 04/04/2017  . Impaired mobility and activities of daily living 04/04/2017  . OSA (obstructive sleep apnea) 09/25/2013  . Obesity, Class III, BMI 40-49.9 (morbid obesity) ([place]) 09/25/2013  . Snoring 09/25/2013  . DM (diabetes mellitus) 06/10/2013  . Severe obesity (BMI >= 40) ([place]) 06/10/2013  . Cervical neuropathic pain 05/24/2012  . SARCOIDOSIS 12/02/2007  . Essential hypertension  12/02/2007  . ASTHMA 12/02/2007  . SLEEP APNEA 12/02/2007  . CERVICAL CANCER, HX OF 12/02/2007    Past Medical History:  Diagnosis Date  . Arthritis   . Asthma   . Cancer (HCC)    Cervical  . COPD (chronic obstructive pulmonary disease) ([place])   . DM (diabetes mellitus) ([place])   . Hyperlipidemia   . Hypertension   . Hypoxemia 09/25/2013  . Oxygen deficiency   . Sleep apnea    No CPAP (broken)    Current Outpatient Medications  Medication Sig Dispense Refill  . acetaminophen (TYLENOL) 500 MG tablet Take 1,000 mg by mouth every 6 (six) hours as needed for moderate pain.    Marland Kitchen aspirin 81 MG tablet Take 81 mg by mouth daily.    Marland Kitchen atorvastatin (LIPITOR) 20 MG tablet Take 1 tablet (20 mg total) by mouth daily. 90 tablet 3  . blood glucose meter kit and supplies Per insurance preference. Use to check blood glucose once a day. Dx E 11.22 1 each 11  . cephALEXin (KEFLEX) 500 MG capsule Take 1 capsule (500 mg total) by mouth 2 (two) times daily. 20 capsule 0  . diphenhydrAMINE (SOMINEX) 25 MG tablet Take 25-50 mg by mouth at bedtime as needed for sleep.    . hydrochlorothiazide (HYDRODIURIL) 25 MG tablet Take 1 tablet (25 mg total) by mouth daily. 90 tablet 3  . metFORMIN (GLUCOPHAGE) 500 MG tablet Take 1 tablet (500 mg total) by mouth 2 (two) times daily with a meal. 180 tablet 3  . OXYGEN Inhale 2 L into the lungs as needed. At home    .  Pramoxine-Camphor-Zinc Acetate (ANTI ITCH EX) Apply 1 application topically daily as needed (itching).    Marland Kitchen albuterol (PROVENTIL) (2.5 MG/3ML) 0.083% nebulizer solution Take 3 mLs (2.5 mg total) by nebulization every 6 (six) hours as needed for wheezing or shortness of breath. (Patient not taking: Reported on 04/23/2018) 150 mL 0  . fluticasone (FLONASE) 50 MCG/ACT nasal spray Place 1 spray into both nostrils daily as needed for allergies or rhinitis.    Marland Kitchen loratadine (CLARITIN) 10 MG tablet Take 10 mg by mouth daily as needed for allergies.    Marland Kitchen nystatin cream  (MYCOSTATIN) Apply 1 application topically 2 (two) times daily. (Patient taking differently: Apply 1 application topically 2 (two) times daily as needed (yeast infection). ) 30 g 3  . ranitidine (ZANTAC) 150 MG capsule Take 150 mg by mouth daily as needed for heartburn.    . traMADol (ULTRAM) 50 MG tablet Take 1 tablet (50 mg total) by mouth every 8 (eight) hours as needed. (Patient not taking: Reported on 07/16/2018) 15 tablet 0   No current facility-administered medications for this visit.     Allergies  Allergen Reactions  . Lisinopril Cough  . Losartan Swelling    Social History   Socioeconomic History  . Marital status: Widowed    Spouse name: Not on file  . Number of children: 7  . Years of education: 55  . Highest education level: Not on file  Occupational History  . Occupation: Retired  Scientific laboratory technician  . Financial resource strain: Not on file  . Food insecurity    Worry: Not on file    Inability: Not on file  . Transportation needs    Medical: Not on file    Non-medical: Not on file  Tobacco Use  . Smoking status: Former Smoker    Packs/day: 1.50    Years: 35.00    Pack years: 52.50    Types: Cigarettes    Quit date: 01/23/2003    Years since quitting: 15.4  . Smokeless tobacco: Never Used  Substance and Sexual Activity  . Alcohol use: Not Currently  . Drug use: No  . Sexual activity: Never  Lifestyle  . Physical activity    Days per week: Not on file    Minutes per session: Not on file  . Stress: Not on file  Relationships  . Social Herbalist on phone: Not on file    Gets together: Not on file    Attends religious service: Not on file    Active member of club or organization: Not on file    Attends meetings of clubs or organizations: Not on file    Relationship status: Not on file  . Intimate partner violence    Fear of current or ex partner: Not on file    Emotionally abused: Not on file    Physically abused: Not on file    Forced sexual  activity: Not on file  Other Topics Concern  . Not on file  Social History Narrative   Patient is single and her daughter lives with her.   Patient has six living children and one child is deceased.   Patient is retired.   Patient has a 10th grade education.   Patient is right-handed.   Patient drinks three cans of soda daily.          Review of Systems  Constitutional: Positive for chills, fever and malaise/fatigue.  HENT: Positive for congestion.   Respiratory: Positive for cough.  Cardiovascular: Positive for chest pain.    Objective   Vitals as reported by the patient: Today's Vitals   07/16/18 0924  Temp: (!) 97.3 F (36.3 C)  TempSrc: Oral  Weight: (!) 324 lb (147 kg)  Height: _0  (1.651 m)   1. Cough - benzonatate (TESSALON) 200 MG capsule; Take 1 capsule (200 mg total) by mouth 2 (two) times daily as needed for cough.  Dispense: 20 capsule; Refill: 0  I discussed the assessment and treatment plan with the patient. The patient was provided an opportunity to ask questions and all were answered. The patient agreed with the plan and demonstrated an understanding of the instructions.   The patient was advised to call back or seek an in-person evaluation if the symptoms worsen or if the condition fails to improve as anticipated.  I provided 15 minutes of non-face-to-face time during this encounter.  LISA Hannah Beat, MD  Primary Care at [facility] 07-16-18

## 2018-07-17 ENCOUNTER — Telehealth: Payer: Self-pay | Admitting: *Deleted

## 2018-07-17 ENCOUNTER — Other Ambulatory Visit: Payer: Medicare Other

## 2018-07-17 DIAGNOSIS — Z20822 Contact with and (suspected) exposure to covid-19: Secondary | ICD-10-CM

## 2018-07-17 NOTE — Telephone Encounter (Signed)
Pt called to verify the address of her appointment; confirmed with pt the appointment address ([place]) and time (07/17/2018 at 1200); she verbalized understanding

## 2018-07-22 LAB — NOVEL CORONAVIRUS, NAA: SARS-CoV-2, NAA: NOT DETECTED

## 2018-11-10 ENCOUNTER — Other Ambulatory Visit: Payer: Self-pay | Admitting: Family Medicine

## 2018-11-11 ENCOUNTER — Ambulatory Visit (INDEPENDENT_AMBULATORY_CARE_PROVIDER_SITE_OTHER): Payer: Medicare Other | Admitting: Family Medicine

## 2018-11-11 VITALS — BP 138/79 | Ht 65.0 in | Wt 324.0 lb

## 2018-11-11 DIAGNOSIS — Z0000 Encounter for general adult medical examination without abnormal findings: Secondary | ICD-10-CM | POA: Diagnosis not present

## 2018-11-11 NOTE — Progress Notes (Signed)
Presents today for [employer] Visit   Date of last exam: 07/16/2018  Interpreter used for this visit? No  I connected with  [name] on 11/11/18 by a telephone  application and verified that I am speaking with the correct person using two identifiers.   I discussed the limitations of evaluation and management by telemedicine. The patient expressed understanding and agreed to proceed.   Patient Care Team: Rutherford Guys, MD as PCP - General (Family Medicine)   Other items to address today:   Discussed Immunizations Discussed Eye/Dental Patient will call to schedule a follow up  Discussed importance of safety in tub/ patient states she has fallen in tub has grab bars and a bathmat.     Other Screening: Last screening for diabetes: 11/25/2017 Last lipid screening: 02/25/2018  ADVANCE DIRECTIVES: Discussed: yes On File: no Materials Provided: yes (mailed)  Immunization status:  Immunization History  Administered Date(s) Administered  . Influenza,inj,Quad PF,6+ Mos 01/19/2014, 10/23/2016  . Influenza-Unspecified 02/23/2016, 09/25/2017  . Pneumococcal Conjugate-13 04/04/2017     Health Maintenance Due  Topic Date Due  . DEXA SCAN  02/19/2010  . MAMMOGRAM  07/09/2014  . OPHTHALMOLOGY EXAM  03/04/2015  . PNA vac Low Risk Adult (2 of 2 - PPSV23) 04/05/2018  . HEMOGLOBIN A1C  05/26/2018  . INFLUENZA VACCINE  08/23/2018  . URINE MICROALBUMIN  11/26/2018     Functional Status Survey: Is the patient deaf or have difficulty hearing?: No Does the patient have difficulty seeing, even when wearing glasses/contacts?: No Does the patient have difficulty concentrating, remembering, or making decisions?: No Does the patient have difficulty walking or climbing stairs?: No Does the patient have difficulty dressing or bathing?: No Does the patient have difficulty doing errands alone such as visiting a doctor's office or shopping?: No   6CIT Screen  11/11/2018  What Year? 0 points  What month? 0 points  What time? 0 points  Count back from 20 0 points  Months in reverse 0 points  Repeat phrase 0 points  Total Score 0        Clinical Support from 11/11/2018 in [facility]  AUDIT-C Score  0       Home Environment:  Lives in one story home with daughter  Yes grab bar in tub Has a mat in bottom of tub  Does not climb stairs due to pain. She has lots of pain with her back when she stands.  States she stays in bed alot has done so for years. Adequate lighting/ no clutter No scattered rugs   Patient Active Problem List   Diagnosis Date Noted  . Cough 07/16/2018  . Screening for colorectal cancer   . Requires supplemental oxygen   . Benign neoplasm of colon   . Nontraumatic pain of left shoulder 04/04/2017  . Impaired mobility and activities of daily living 04/04/2017  . OSA (obstructive sleep apnea) 09/25/2013  . Obesity, Class III, BMI 40-49.9 (morbid obesity) ([place]) 09/25/2013  . Snoring 09/25/2013  . DM (diabetes mellitus) 06/10/2013  . Severe obesity (BMI >= 40) ([place]) 06/10/2013  . Cervical neuropathic pain 05/24/2012  . SARCOIDOSIS 12/02/2007  . Essential hypertension 12/02/2007  . ASTHMA 12/02/2007  . SLEEP APNEA 12/02/2007  . CERVICAL CANCER, HX OF 12/02/2007     Past Medical History:  Diagnosis Date  . Arthritis   . Asthma   . Cancer (HCC)    Cervical  . COPD (chronic obstructive pulmonary disease) (  [place])   . DM (diabetes mellitus) ([place])   . Hyperlipidemia   . Hypertension   . Hypoxemia 09/25/2013  . Oxygen deficiency   . Sleep apnea    No CPAP (broken)     Past Surgical History:  Procedure Laterality Date  . ABDOMINAL HYSTERECTOMY    . COLONOSCOPY  2000?   Dr.Johnson  . COLONOSCOPY WITH PROPOFOL N/A 03/31/2018   Procedure: COLONOSCOPY WITH PROPOFOL;  Surgeon: Yetta Flock, MD;  Location: [facility];  Service: Gastroenterology;  Laterality: N/A;  . EYE SURGERY    .  POLYPECTOMY  03/31/2018   Procedure: POLYPECTOMY;  Surgeon: Yetta Flock, MD;  Location: [facility];  Service: Gastroenterology;;  . TUBAL LIGATION       Family History  Problem Relation Age of Onset  . Emphysema Mother        never smoker  . CAD Mother 23  . Asthma Mother   . Colon polyps Mother   . Diabetes Father   . CAD Brother 59  . Asthma Daughter   . Asthma Sister   . Colon cancer Cousin        22's  . Esophageal cancer Neg Hx   . Rectal cancer Neg Hx   . Stomach cancer Neg Hx      Social History   Socioeconomic History  . Marital status: Widowed    Spouse name: Not on file  . Number of children: 7  . Years of education: 40  . Highest education level: Not on file  Occupational History  . Occupation: Retired  Scientific laboratory technician  . Financial resource strain: Not on file  . Food insecurity    Worry: Not on file    Inability: Not on file  . Transportation needs    Medical: Not on file    Non-medical: Not on file  Tobacco Use  . Smoking status: Former Smoker    Packs/day: 1.50    Years: 35.00    Pack years: 52.50    Types: Cigarettes    Quit date: 01/23/2003    Years since quitting: 15.8  . Smokeless tobacco: Never Used  Substance and Sexual Activity  . Alcohol use: Not Currently  . Drug use: No  . Sexual activity: Never  Lifestyle  . Physical activity    Days per week: Not on file    Minutes per session: Not on file  . Stress: Not on file  Relationships  . Social Herbalist on phone: Not on file    Gets together: Not on file    Attends religious service: Not on file    Active member of club or organization: Not on file    Attends meetings of clubs or organizations: Not on file    Relationship status: Not on file  . Intimate partner violence    Fear of current or ex partner: Not on file    Emotionally abused: Not on file    Physically abused: Not on file    Forced sexual activity: Not on file  Other Topics Concern  . Not on file   Social History Narrative   Patient is single and her daughter lives with her.   Patient has six living children and one child is deceased.   Patient is retired.   Patient has a 10th grade education.   Patient is right-handed.   Patient drinks three cans of soda daily.           Allergies  Allergen  Reactions  . Lisinopril Cough  . Losartan Swelling     Prior to Admission medications   Medication Sig Start Date End Date Taking? Authorizing Provider  acetaminophen (TYLENOL) 500 MG tablet Take 1,000 mg by mouth every 6 (six) hours as needed for moderate pain.   Yes [provider]  albuterol (PROVENTIL) (2.5 MG/3ML) 0.083% nebulizer solution Take 3 mLs (2.5 mg total) by nebulization every 6 (six) hours as needed for wheezing or shortness of breath. 02/25/18  Yes Rutherford Guys, MD  aspirin 81 MG tablet Take 81 mg by mouth daily.   Yes [provider]  atorvastatin (LIPITOR) 20 MG tablet Take 1 tablet by mouth daily 11/10/18  Yes Rutherford Guys, MD  blood glucose meter kit and supplies Per insurance preference. Use to check blood glucose once a day. Dx E 11.22 02/25/18  Yes Rutherford Guys, MD  diphenhydrAMINE (SOMINEX) 25 MG tablet Take 25-50 mg by mouth at bedtime as needed for sleep.   Yes [provider]  fexofenadine-pseudoephedrine (ALLEGRA-D 24) 180-240 MG 24 hr tablet Take 1 tablet by mouth daily.   Yes [provider]  hydrochlorothiazide (HYDRODIURIL) 25 MG tablet Take 1 tablet (25 mg total) by mouth daily. 11/25/17  Yes Rutherford Guys, MD  OXYGEN Inhale 2 L into the lungs as needed. At home   Yes [provider]  ranitidine (ZANTAC) 150 MG capsule Take 150 mg by mouth daily as needed for heartburn.   Yes [provider]  azithromycin (ZITHROMAX) 250 MG tablet Take 2 today then 1 po day 2-5 07/16/18   Corum, Rex Kras, MD  benzonatate (TESSALON) 200 MG capsule Take 1 capsule (200 mg total) by mouth 2 (two) times daily as needed  for cough. 07/16/18   Corum, Rex Kras, MD  cephALEXin (KEFLEX) 500 MG capsule Take 1 capsule (500 mg total) by mouth 2 (two) times daily. 04/23/18   Wendie Agreste, MD  fluticasone (FLONASE) 50 MCG/ACT nasal spray Place 1 spray into both nostrils daily as needed for allergies or rhinitis.    [provider]  loratadine (CLARITIN) 10 MG tablet Take 10 mg by mouth daily as needed for allergies.    [provider]  metFORMIN (GLUCOPHAGE) 500 MG tablet Take 1 tablet (500 mg total) by mouth 2 (two) times daily with a meal. Patient not taking: Reported on 11/11/2018 11/25/17   Rutherford Guys, MD  nystatin cream (MYCOSTATIN) Apply 1 application topically 2 (two) times daily. Patient not taking: Reported on 11/11/2018 11/25/17   Rutherford Guys, MD  Pramoxine-Camphor-Zinc Acetate ([facility] EX) Apply 1 application topically daily as needed (itching).    [provider]  traMADol (ULTRAM) 50 MG tablet Take 1 tablet (50 mg total) by mouth every 8 (eight) hours as needed. Patient not taking: Reported on 07/16/2018 04/23/18   Wendie Agreste, MD     Depression screen [facility] 2/9 11/11/2018 07/16/2018 04/23/2018 02/25/2018 11/25/2017  Decreased Interest 3 0 0 0 0  Down, Depressed, Hopeless 3 0 0 0 0  PHQ - 2 Score 6 0 0 0 0  Altered sleeping 2 - - - -  Tired, decreased energy 1 - - - -  Change in appetite 1 - - - -  Feeling bad or failure about yourself  0 - - - -  Trouble concentrating 0 - - - -  Moving slowly or fidgety/restless 0 - - - -  Suicidal thoughts 0 - - - -  PHQ-9 Score 10 - - - -     Fall Risk  11/11/2018 07/16/2018 04/23/2018 02/25/2018 02/25/2018  Falls in the past year? 1 0 0 - 0  Number falls in past yr: 1 0 0 0 0  Comment get out of bathtub, no injury, - - - -  Injury with Fall? 0 0 0 - 0  Risk for fall due to : - - - - Impaired mobility  Follow up Falls evaluation completed;Education provided;Falls prevention discussed - - - -      PHYSICAL EXAM: BP 138/79  Comment: taken from previous visit  Ht _0  (1.651 m)   Wt (!) 324 lb (147 kg)   BMI 53.92 kg/m    Wt Readings from Last 3 Encounters:  11/11/18 (!) 324 lb (147 kg)  07/16/18 (!) 324 lb (147 kg)  03/31/18 (!) 325 lb (147.4 kg)    Medicare annual wellness visit, subsequent    Education/Counseling provided regarding diet and exercise, prevention of chronic diseases, smoking/tobacco cessation, if applicable, and reviewed "Covered Medicare Preventive Services."

## 2018-11-11 NOTE — Patient Instructions (Addendum)
Thank you for taking time to come for your Medicare Wellness Visit. I appreciate your ongoing commitment to your health goals. Please review the following plan we discussed and let me know if I can assist you in the future.  Julie Greer LPN  Preventive Care 73 Years and Older, Female Preventive care refers to lifestyle choices and visits with your health care provider that can promote health and wellness. This includes:  A yearly physical exam. This is also called an annual well check.  Regular dental and eye exams.  Immunizations.  Screening for certain conditions.  Healthy lifestyle choices, such as diet and exercise. What can I expect for my preventive care visit? Physical exam Your health care provider will check:  Height and weight. These may be used to calculate body mass index (BMI), which is a measurement that tells if you are at a healthy weight.  Heart rate and blood pressure.  Your skin for abnormal spots. Counseling Your health care provider may ask you questions about:  Alcohol, tobacco, and drug use.  Emotional well-being.  Home and relationship well-being.  Sexual activity.  Eating habits.  History of falls.  Memory and ability to understand (cognition).  Work and work environment.  Pregnancy and menstrual history. What immunizations do I need?  Influenza (flu) vaccine  This is recommended every year. Tetanus, diphtheria, and pertussis (Tdap) vaccine  You may need a Td booster every 10 years. Varicella (chickenpox) vaccine  You may need this vaccine if you have not already been vaccinated. Zoster (shingles) vaccine  You may need this after age 60. Pneumococcal conjugate (PCV13) vaccine  One dose is recommended after age 65. Pneumococcal polysaccharide (PPSV23) vaccine  One dose is recommended after age 65. Measles, mumps, and rubella (MMR) vaccine  You may need at least one dose of MMR if you were born in 1957 or later. You may also  need a second dose. Meningococcal conjugate (MenACWY) vaccine  You may need this if you have certain conditions. Hepatitis A vaccine  You may need this if you have certain conditions or if you travel or work in places where you may be exposed to hepatitis A. Hepatitis B vaccine  You may need this if you have certain conditions or if you travel or work in places where you may be exposed to hepatitis B. Haemophilus influenzae type b (Hib) vaccine  You may need this if you have certain conditions. You may receive vaccines as individual doses or as more than one vaccine together in one shot (combination vaccines). Talk with your health care provider about the risks and benefits of combination vaccines. What tests do I need? Blood tests  Lipid and cholesterol levels. These may be checked every 5 years, or more frequently depending on your overall health.  Hepatitis C test.  Hepatitis B test. Screening  Lung cancer screening. You may have this screening every year starting at age 73 if you have a 30-pack-year history of smoking and currently smoke or have quit within the past 15 years.  Colorectal cancer screening. All adults should have this screening starting at age 73 and continuing until age 75. Your health care provider may recommend screening at age 45 if you are at increased risk. You will have tests every 1-10 years, depending on your results and the type of screening test.  Diabetes screening. This is done by checking your blood sugar (glucose) after you have not eaten for a while (fasting). You may have this done every 1-3   years.  Mammogram. This may be done every 1-2 years. Talk with your health care provider about how often you should have regular mammograms.  BRCA-related cancer screening. This may be done if you have a family history of breast, ovarian, tubal, or peritoneal cancers. Other tests  Sexually transmitted disease (STD) testing.  Bone density scan. This is done  to screen for osteoporosis. You may have this done starting at age 6. Follow these instructions at home: Eating and drinking  Eat a diet that includes fresh fruits and vegetables, whole grains, lean protein, and low-fat dairy products. Limit your intake of foods with high amounts of sugar, saturated fats, and salt.  Take vitamin and mineral supplements as recommended by your health care provider.  Do not drink alcohol if your health care provider tells you not to drink.  If you drink alcohol: ? Limit how much you have to 0-1 drink a day. ? Be aware of how much alcohol is in your drink. In the [place] one drink equals one 12 oz bottle of beer (355 mL), one 5 oz glass of wine (148 mL), or one 1 oz glass of hard liquor (44 mL). Lifestyle  Take daily care of your teeth and gums.  Stay active. Exercise for at least 30 minutes on 5 or more days each week.  Do not use any products that contain nicotine or tobacco, such as cigarettes, e-cigarettes, and chewing tobacco. If you need help quitting, ask your health care provider.  If you are sexually active, practice safe sex. Use a condom or other form of protection in order to prevent STIs (sexually transmitted infections).  Talk with your health care provider about taking a low-dose aspirin or statin. What's next?  Go to your health care provider once a year for a well check visit.  Ask your health care provider how often you should have your eyes and teeth checked.  Stay up to date on all vaccines. This information is not intended to replace advice given to you by your health care provider. Make sure you discuss any questions you have with your health care provider. Document Released: 02/04/2015 Document Revised: 01/02/2018 Document Reviewed: 01/02/2018 Elsevier Patient Education  2020 [employer].

## 2018-12-16 ENCOUNTER — Other Ambulatory Visit: Payer: Self-pay | Admitting: Family Medicine

## 2018-12-16 DIAGNOSIS — E1122 Type 2 diabetes mellitus with diabetic chronic kidney disease: Secondary | ICD-10-CM

## 2018-12-16 DIAGNOSIS — N183 Chronic kidney disease, stage 3 unspecified: Secondary | ICD-10-CM

## 2019-01-02 ENCOUNTER — Other Ambulatory Visit: Payer: Self-pay | Admitting: Family Medicine

## 2019-01-02 DIAGNOSIS — I10 Essential (primary) hypertension: Secondary | ICD-10-CM

## 2019-03-15 DIAGNOSIS — G4733 Obstructive sleep apnea (adult) (pediatric): Secondary | ICD-10-CM | POA: Diagnosis not present

## 2019-03-15 DIAGNOSIS — J449 Chronic obstructive pulmonary disease, unspecified: Secondary | ICD-10-CM | POA: Diagnosis not present

## 2019-04-12 DIAGNOSIS — J449 Chronic obstructive pulmonary disease, unspecified: Secondary | ICD-10-CM | POA: Diagnosis not present

## 2019-04-12 DIAGNOSIS — G4733 Obstructive sleep apnea (adult) (pediatric): Secondary | ICD-10-CM | POA: Diagnosis not present

## 2019-04-26 ENCOUNTER — Other Ambulatory Visit: Payer: Self-pay | Admitting: Family Medicine

## 2019-04-26 DIAGNOSIS — I10 Essential (primary) hypertension: Secondary | ICD-10-CM

## 2019-04-26 DIAGNOSIS — E1122 Type 2 diabetes mellitus with diabetic chronic kidney disease: Secondary | ICD-10-CM

## 2019-04-26 DIAGNOSIS — N183 Chronic kidney disease, stage 3 unspecified: Secondary | ICD-10-CM

## 2019-04-26 NOTE — Telephone Encounter (Signed)
Requested Prescriptions  Pending Prescriptions Disp Refills  . hydrochlorothiazide (HYDRODIURIL) 25 MG tablet [Pharmacy Med Name: hydroCHLOROthiazide 25 MG Oral Tablet] 90 tablet 0    Sig: Take 1 tablet by mouth once daily     Cardiovascular: Diuretics - Thiazide Failed - 04/26/2019 10:32 AM      Failed - Ca in normal range and within 360 days    Calcium  Date Value Ref Range Status  02/25/2018 9.3 8.7 - 10.3 mg/dL Final         Failed - Cr in normal range and within 360 days    Creat  Date Value Ref Range Status  04/05/2015 1.05 (H) 0.60 - 0.93 mg/dL Final   Creatinine, Ser  Date Value Ref Range Status  02/25/2018 1.56 (H) 0.57 - 1.00 mg/dL Final         Failed - K in normal range and within 360 days    Potassium  Date Value Ref Range Status  02/25/2018 3.8 3.5 - 5.2 mmol/L Final         Failed - Na in normal range and within 360 days    Sodium  Date Value Ref Range Status  02/25/2018 140 134 - 144 mmol/L Final         Passed - Last BP in normal range    BP Readings from Last 1 Encounters:  11/11/18 138/79         Passed - Valid encounter within last 6 months    Recent Outpatient Visits          5 months ago Medicare annual wellness visit, subsequent   [facility], Lilia Argue, MD   9 months ago Cough   [facility] at [facility], Rex Kras, MD   1 year ago Pain, joint, foot, right   [facility], Ranell Patrick, MD   1 year ago Essential hypertension   [facility], Lilia Argue, MD   1 year ago Essential hypertension   [facility], Lilia Argue, MD             . metFORMIN (GLUCOPHAGE) 500 MG tablet [Pharmacy Med Name: metFORMIN HCl 500 MG Oral Tablet] 180 tablet 0    Sig: TAKE 1 TABLET BY MOUTH TWICE DAILY WITH MEALS     Endocrinology:  Diabetes - Biguanides Failed - 04/26/2019 10:32 AM      Failed - Cr in normal range and within 360 days    Creat  Date Value Ref Range Status  04/05/2015 1.05 (H)  0.60 - 0.93 mg/dL Final   Creatinine, Ser  Date Value Ref Range Status  02/25/2018 1.56 (H) 0.57 - 1.00 mg/dL Final         Failed - HBA1C is between 0 and 7.9 and within 180 days    Hgb A1c MFr Bld  Date Value Ref Range Status  11/25/2017 6.1 (H) 4.8 - 5.6 % Final    Comment:             Prediabetes: 5.7 - 6.4          Diabetes: >6.4          Glycemic control for adults with diabetes: <7.0          Failed - eGFR in normal range and within 360 days    GFR, Est African American  Date Value Ref Range Status  04/05/2015 62 >=60 mL/min Final   GFR calc Af Wyvonnia Lora  Date Value Ref Range Status  02/25/2018 38 (L) >59 mL/min/1.73 Final   GFR, Est Non African American  Date Value Ref Range Status  04/05/2015 54 (L) >=60 mL/min Final    Comment:      The estimated GFR is a calculation valid for adults (>=18 years old) that uses the CKD-EPI algorithm to adjust for age and sex. It is   not to be used for children, pregnant women, hospitalized patients,    patients on dialysis, or with rapidly changing kidney function. According to the NKDEP, eGFR >89 is normal, 60-89 shows mild impairment, 30-59 shows moderate impairment, 15-29 shows severe impairment and <15 is ESRD.      GFR calc non Af Amer  Date Value Ref Range Status  02/25/2018 33 (L) >59 mL/min/1.73 Final         Passed - Valid encounter within last 6 months    Recent Outpatient Visits          5 months ago Medicare annual wellness visit, subsequent   [facility], Irma M, MD   9 months ago Cough   [facility], Lisa L, MD   1 year ago Pain, joint, foot, right   [facility], Jeffrey R, MD   1 year ago Essential hypertension   [facility], Irma M, MD   1 year ago Essential hypertension   [facility], Irma M, MD               

## 2019-04-26 NOTE — Telephone Encounter (Signed)
Requested Prescriptions  Pending Prescriptions Disp Refills  . atorvastatin (LIPITOR) 20 MG tablet [Pharmacy Med Name: Atorvastatin Calcium 20 MG Oral Tablet] 90 tablet 0    Sig: Take 1 tablet by mouth once daily     Cardiovascular:  Antilipid - Statins Failed - 04/26/2019 10:38 AM      Failed - Total Cholesterol in normal range and within 360 days    Cholesterol, Total  Date Value Ref Range Status  02/25/2018 159 100 - 199 mg/dL Final         Failed - LDL in normal range and within 360 days    LDL Calculated  Date Value Ref Range Status  02/25/2018 65 0 - 99 mg/dL Final         Failed - HDL in normal range and within 360 days    HDL  Date Value Ref Range Status  02/25/2018 72 >39 mg/dL Final         Failed - Triglycerides in normal range and within 360 days    Triglycerides  Date Value Ref Range Status  02/25/2018 110 0 - 149 mg/dL Final         Passed - Patient is not pregnant      Passed - Valid encounter within last 12 months    Recent Outpatient Visits          5 months ago Medicare annual wellness visit, subsequent   [facility], Lilia Argue, MD   9 months ago Cough   [facility] at [facility], Rex Kras, MD   1 year ago Pain, joint, foot, right   [facility], Ranell Patrick, MD   1 year ago Essential hypertension   [facility], Lilia Argue, MD   1 year ago Essential hypertension   [facility], Lilia Argue, MD

## 2019-05-13 DIAGNOSIS — J449 Chronic obstructive pulmonary disease, unspecified: Secondary | ICD-10-CM | POA: Diagnosis not present

## 2019-05-13 DIAGNOSIS — G4733 Obstructive sleep apnea (adult) (pediatric): Secondary | ICD-10-CM | POA: Diagnosis not present

## 2019-06-05 ENCOUNTER — Other Ambulatory Visit: Payer: Self-pay | Admitting: Family Medicine

## 2019-06-05 DIAGNOSIS — I10 Essential (primary) hypertension: Secondary | ICD-10-CM

## 2019-06-05 NOTE — Telephone Encounter (Signed)
Requested medication (s) are due for refill today: No patient ordered #90 04/26/19 and 0 refills  Requested medication (s) are on the active medication list: yes  Last refill:  04/26/19  #90  0 refills    Future visit scheduled: No needs OV  Notes to clinic:  last labs 11/25/17 Please review Needs OV and labs.    Requested Prescriptions  Pending Prescriptions Disp Refills   hydrochlorothiazide (HYDRODIURIL) 25 MG tablet [Pharmacy Med Name: hydroCHLOROthiazide 25 MG Oral Tablet] 90 tablet 0    Sig: Take 1 tablet by mouth once daily      Cardiovascular: Diuretics - Thiazide Failed - 06/05/2019  5:06 PM      Failed - Ca in normal range and within 360 days    Calcium  Date Value Ref Range Status  02/25/2018 9.3 8.7 - 10.3 mg/dL Final          Failed - Cr in normal range and within 360 days    Creat  Date Value Ref Range Status  04/05/2015 1.05 (H) 0.60 - 0.93 mg/dL Final   Creatinine, Ser  Date Value Ref Range Status  02/25/2018 1.56 (H) 0.57 - 1.00 mg/dL Final          Failed - K in normal range and within 360 days    Potassium  Date Value Ref Range Status  02/25/2018 3.8 3.5 - 5.2 mmol/L Final          Failed - Na in normal range and within 360 days    Sodium  Date Value Ref Range Status  02/25/2018 140 134 - 144 mmol/L Final          Failed - Valid encounter within last 6 months    Recent Outpatient Visits           6 months ago Medicare annual wellness visit, subsequent   [facility], Lilia Argue, MD   10 months ago Cough   [facility] at [facility], Rex Kras, MD   1 year ago Pain, joint, foot, right   [facility], Ranell Patrick, MD   1 year ago Essential hypertension   [facility], Lilia Argue, MD   1 year ago Essential hypertension   [facility], Lilia Argue, MD              Passed - Last BP in normal range    BP Readings from Last 1 Encounters:  11/11/18 138/79

## 2019-06-08 NOTE — Telephone Encounter (Signed)
Called pt, no option for vm to be left, pt needs an appt for more meds and needs labs as well

## 2019-06-09 NOTE — Telephone Encounter (Signed)
Called pt., Acknowledged need for appt. Asserted they would call back to schedule for refills.

## 2019-06-10 NOTE — Telephone Encounter (Signed)
Pt called and sch refill appt for 06/30/19. Pt is requesting a courtesy refill to get her threw till her appt. Please advise  [place], [place].

## 2019-06-12 DIAGNOSIS — G4733 Obstructive sleep apnea (adult) (pediatric): Secondary | ICD-10-CM | POA: Diagnosis not present

## 2019-06-12 DIAGNOSIS — J449 Chronic obstructive pulmonary disease, unspecified: Secondary | ICD-10-CM | POA: Diagnosis not present

## 2019-06-30 ENCOUNTER — Ambulatory Visit (INDEPENDENT_AMBULATORY_CARE_PROVIDER_SITE_OTHER): Payer: Medicare PPO

## 2019-06-30 ENCOUNTER — Ambulatory Visit (INDEPENDENT_AMBULATORY_CARE_PROVIDER_SITE_OTHER): Payer: Medicare PPO | Admitting: Family Medicine

## 2019-06-30 ENCOUNTER — Other Ambulatory Visit: Payer: Self-pay

## 2019-06-30 ENCOUNTER — Encounter: Payer: Self-pay | Admitting: Family Medicine

## 2019-06-30 VITALS — BP 131/79 | HR 110 | Temp 98.2°F | Ht 65.0 in | Wt 319.2 lb

## 2019-06-30 DIAGNOSIS — E1121 Type 2 diabetes mellitus with diabetic nephropathy: Secondary | ICD-10-CM

## 2019-06-30 DIAGNOSIS — Z23 Encounter for immunization: Secondary | ICD-10-CM | POA: Diagnosis not present

## 2019-06-30 DIAGNOSIS — N1831 Chronic kidney disease, stage 3a: Secondary | ICD-10-CM | POA: Diagnosis not present

## 2019-06-30 DIAGNOSIS — I10 Essential (primary) hypertension: Secondary | ICD-10-CM

## 2019-06-30 DIAGNOSIS — R0602 Shortness of breath: Secondary | ICD-10-CM | POA: Diagnosis not present

## 2019-06-30 MED ORDER — FUROSEMIDE 20 MG PO TABS: 20.0000 mg | ORAL_TABLET | Freq: Every day | ORAL | 3 refills | Status: DC

## 2019-06-30 MED ORDER — CARVEDILOL 3.125 MG PO TABS: 3.1250 mg | ORAL_TABLET | Freq: Two times a day (BID) | ORAL | 3 refills | Status: DC

## 2019-06-30 NOTE — Progress Notes (Signed)
6/8/202111:59 AM  Christine Parrish 1945/04/14, 74 y.o., female [id number]  Chief Complaint  Patient presents with  . Hypertension    will bring in covid vaccine card   . Medication Refill  . Fall    bathroom is a fall risk, needs assistance with getting tub adjusted    HPI:   Patient is a 74 y.o. female with past medical history significant for DM2, HTN, arthritiswho presents today for medication refills  Patient's last OV with me feb 2020  She is overall doing well She however has had 4 falls either getting in or out of her tub Despite having a grab bar available Last fall had to wait all day until her daughter came back from work as she could not get out of the tub by herself Now she will only shower if there is someone in the house  Has completed covid vaccines She is always feeling cold Using flonase for allergies  No fever, chills, coughing She reports feeling SOB all the time, albuterol sometimes helps her breathing, told she has bronchoasthma, used to smoke No wheezing or chest tightness No chest pain, rare palpitations No leg swelling Has known OSA but does not use cpap  Lab Results  Component Value Date   HGBA1C 6.1 (H) 11/25/2017   HGBA1C 6.3 (H) 04/04/2017   HGBA1C 6.3 10/23/2016   Lab Results  Component Value Date   LDLCALC 65 02/25/2018   CREATININE 1.56 (H) 02/25/2018   Wt Readings from Last 3 Encounters:  06/30/19 (!) 319 lb 3.2 oz (144.8 kg)  11/11/18 (!) 324 lb (147 kg)  07/16/18 (!) 324 lb (147 kg)   BP Readings from Last 3 Encounters:  06/30/19 131/79  11/11/18 138/79  03/31/18 138/79     Depression screen PHQ 2/9 11/11/2018 07/16/2018 04/23/2018  Decreased Interest 3 0 0  Down, Depressed, Hopeless 3 0 0  PHQ - 2 Score 6 0 0  Altered sleeping 2 - -  Tired, decreased energy 1 - -  Change in appetite 1 - -  Feeling bad or failure about yourself  0 - -  Trouble concentrating 0 - -  Moving slowly or fidgety/restless 0 - -  Suicidal  thoughts 0 - -  PHQ-9 Score 10 - -    Fall Risk  06/30/2019 11/11/2018 07/16/2018 04/23/2018 02/25/2018  Falls in the past year? 1 1 0 0 -  Number falls in past yr: 1 1 0 0 0  Comment - get out of bathtub, no injury, - - -  Injury with Fall? 1 0 0 0 -  Risk for fall due to : Impaired mobility - - - -  Follow up - Falls evaluation completed;Education provided;Falls prevention discussed - - -     Allergies  Allergen Reactions  . Lisinopril Cough  . Losartan Swelling    Prior to Admission medications   Medication Sig Start Date End Date Taking? Authorizing Provider  acetaminophen (TYLENOL) 500 MG tablet Take 1,000 mg by mouth every 6 (six) hours as needed for moderate pain.   Yes [provider]  albuterol (PROVENTIL) (2.5 MG/3ML) 0.083% nebulizer solution Take 3 mLs (2.5 mg total) by nebulization every 6 (six) hours as needed for wheezing or shortness of breath. 02/25/18  Yes Rutherford Guys, MD  aspirin 81 MG tablet Take 81 mg by mouth daily.   Yes [provider]  atorvastatin (LIPITOR) 20 MG tablet Take 1 tablet by mouth once daily 04/26/19  Yes Pamella Pert, Terren Haberle  M, MD  azithromycin (ZITHROMAX) 250 MG tablet Take 2 today then 1 po day 2-5 07/16/18  Yes Corum, Rex Kras, MD  benzonatate (TESSALON) 200 MG capsule Take 1 capsule (200 mg total) by mouth 2 (two) times daily as needed for cough. 07/16/18  Yes Corum, Rex Kras, MD  blood glucose meter kit and supplies Per insurance preference. Use to check blood glucose once a day. Dx E 11.22 02/25/18  Yes Rutherford Guys, MD  cephALEXin (KEFLEX) 500 MG capsule Take 1 capsule (500 mg total) by mouth 2 (two) times daily. 04/23/18  Yes Wendie Agreste, MD  diphenhydrAMINE (SOMINEX) 25 MG tablet Take 25-50 mg by mouth at bedtime as needed for sleep.   Yes [provider]  fexofenadine-pseudoephedrine (ALLEGRA-D 24) 180-240 MG 24 hr tablet Take 1 tablet by mouth daily.   Yes [provider]  fluticasone (FLONASE) 50 MCG/ACT nasal  spray Place 1 spray into both nostrils daily as needed for allergies or rhinitis.   Yes [provider]  hydrochlorothiazide (HYDRODIURIL) 25 MG tablet Take 1 tablet by mouth once daily 06/10/19  Yes Rutherford Guys, MD  loratadine (CLARITIN) 10 MG tablet Take 10 mg by mouth daily as needed for allergies.   Yes [provider]  metFORMIN (GLUCOPHAGE) 500 MG tablet TAKE 1 TABLET BY MOUTH TWICE DAILY WITH MEALS 04/26/19  Yes Rutherford Guys, MD  nystatin cream (MYCOSTATIN) Apply 1 application topically 2 (two) times daily. 11/25/17  Yes Rutherford Guys, MD  OXYGEN Inhale 2 L into the lungs as needed. At home   Yes [provider]  Pramoxine-Camphor-Zinc Acetate (ANTI ITCH EX) Apply 1 application topically daily as needed (itching).   Yes [provider]  ranitidine (ZANTAC) 150 MG capsule Take 150 mg by mouth daily as needed for heartburn.   Yes [provider]  traMADol (ULTRAM) 50 MG tablet Take 1 tablet (50 mg total) by mouth every 8 (eight) hours as needed. 04/23/18  Yes Wendie Agreste, MD    Past Medical History:  Diagnosis Date  . Arthritis   . Asthma   . Cancer (HCC)    Cervical  . COPD (chronic obstructive pulmonary disease) ([place])   . DM (diabetes mellitus) ([place])   . Hyperlipidemia   . Hypertension   . Hypoxemia 09/25/2013  . Oxygen deficiency   . Sleep apnea    No CPAP (broken)    Past Surgical History:  Procedure Laterality Date  . ABDOMINAL HYSTERECTOMY    . COLONOSCOPY  2000?   Dr.Johnson  . COLONOSCOPY WITH PROPOFOL N/A 03/31/2018   Procedure: COLONOSCOPY WITH PROPOFOL;  Surgeon: Yetta Flock, MD;  Location: [facility];  Service: Gastroenterology;  Laterality: N/A;  . EYE SURGERY    . POLYPECTOMY  03/31/2018   Procedure: POLYPECTOMY;  Surgeon: Yetta Flock, MD;  Location: [facility];  Service: Gastroenterology;;  . TUBAL LIGATION      Social History   Tobacco Use  . Smoking status: Former Smoker     Packs/day: 1.50    Years: 35.00    Pack years: 52.50    Types: Cigarettes    Quit date: 01/23/2003    Years since quitting: 16.4  . Smokeless tobacco: Never Used  Substance Use Topics  . Alcohol use: Not Currently    Family History  Problem Relation Age of Onset  . Emphysema Mother        never smoker  . CAD Mother 47  . Asthma Mother   .  Colon polyps Mother   . Diabetes Father   . CAD Brother 33  . Asthma Daughter   . Asthma Sister   . Colon cancer Cousin        53's  . Esophageal cancer Neg Hx   . Rectal cancer Neg Hx   . Stomach cancer Neg Hx     ROS Per hpi  OBJECTIVE:  Today's Vitals   06/30/19 1148  BP: 131/79  Pulse: (!) 110  Temp: 98.2 F (36.8 C)  SpO2: 94%  Weight: (!) 319 lb 3.2 oz (144.8 kg)  Height: 5' 5"  (1.651 m)   Body mass index is 53.12 kg/m.   Physical Exam Vitals and nursing note reviewed.  Constitutional:      Appearance: She is well-developed.  HENT:     Head: Normocephalic and atraumatic.     Mouth/Throat:     Pharynx: No oropharyngeal exudate.  Eyes:     General: No scleral icterus.    Conjunctiva/sclera: Conjunctivae normal.     Pupils: Pupils are equal, round, and reactive to light.  Neck:     Vascular: No JVD.  Cardiovascular:     Rate and Rhythm: Regular rhythm. Tachycardia present.     Heart sounds: Normal heart sounds. No murmur. No friction rub. No gallop.   Pulmonary:     Effort: Pulmonary effort is normal.     Breath sounds: Normal breath sounds. No wheezing, rhonchi or rales.  Musculoskeletal:     Cervical back: Neck supple.     Right lower leg: No edema.     Left lower leg: No edema.  Skin:    General: Skin is warm and dry.  Neurological:     Mental Status: She is alert and oriented to person, place, and time.     No results found for this or any previous visit (from the past 24 hour(s)).  No results found.   CXR - official read not available yet, cardiomegaly with early signs of cephalization,  flattened diaphragm, no consolidation nor pulmonary edema  My interpretation of EKG:  Sinus tach, HR 117, RAD   ASSESSMENT and PLAN  1. SOB (shortness of breath) RF: untreated OSA, former smoker, obese. tachy with early CHF on cxr? Start low dose coreg and lasix, echo ordered. Fu 1 week.  - DG Chest 2 View - EKG 12-Lead - ECHOCARDIOGRAM COMPLETE; Future  2. Need for vaccination - Pneumococcal polysaccharide vaccine 23-valent greater than or equal to 2yo subcutaneous/IM  3. Essential hypertension - Lipid panel - CMP14+EGFR - TSH  4. Type 2 diabetes mellitus with stage 3a chronic kidney disease, without long-term current use of insulin (HCC) - Hemoglobin A1c - Microalbumin/Creatinine Ratio, Urine; Future  Other orders - carvedilol (COREG) 3.125 MG tablet; Take 1 tablet (3.125 mg total) by mouth 2 (two) times daily with a meal. - furosemide (LASIX) 20 MG tablet; Take 1 tablet (20 mg total) by mouth daily.  Return in about 1 week (around 07/07/2019) for sob.    Rutherford Guys, MD Primary [facility] at [facility] [address], [place] [address] Ph.  [phone] Fax [phone]

## 2019-07-01 LAB — CMP14+EGFR
ALT: 11 IU/L (ref 0–32)
AST: 17 IU/L (ref 0–40)
Albumin/Globulin Ratio: 1.1 — ABNORMAL LOW (ref 1.2–2.2)
Albumin: 4.4 g/dL (ref 3.7–4.7)
Alkaline Phosphatase: 116 IU/L (ref 48–121)
BUN/Creatinine Ratio: 14 (ref 12–28)
BUN: 23 mg/dL (ref 8–27)
Bilirubin Total: 0.4 mg/dL (ref 0.0–1.2)
CO2: 24 mmol/L (ref 20–29)
Calcium: 9.8 mg/dL (ref 8.7–10.3)
Chloride: 99 mmol/L (ref 96–106)
Creatinine, Ser: 1.66 mg/dL — ABNORMAL HIGH (ref 0.57–1.00)
GFR calc Af Amer: 35 mL/min/{1.73_m2} — ABNORMAL LOW (ref >59)
GFR calc non Af Amer: 30 mL/min/{1.73_m2} — ABNORMAL LOW (ref >59)
Globulin, Total: 4 g/dL (ref 1.5–4.5)
Glucose: 168 mg/dL — ABNORMAL HIGH (ref 65–99)
Potassium: 3.6 mmol/L (ref 3.5–5.2)
Sodium: 141 mmol/L (ref 134–144)
Total Protein: 8.4 g/dL (ref 6.0–8.5)

## 2019-07-01 LAB — HEMOGLOBIN A1C
Est. average glucose Bld gHb Est-mCnc: 128 mg/dL
Hgb A1c MFr Bld: 6.1 % — ABNORMAL HIGH (ref 4.8–5.6)

## 2019-07-01 LAB — TSH: TSH: 1.37 u[IU]/mL (ref 0.450–4.500)

## 2019-07-01 LAB — LIPID PANEL
Chol/HDL Ratio: 2.5 ratio (ref 0.0–4.4)
Cholesterol, Total: 148 mg/dL (ref 100–199)
HDL: 59 mg/dL (ref >39)
LDL Chol Calc (NIH): 69 mg/dL (ref 0–99)
Triglycerides: 115 mg/dL (ref 0–149)
VLDL Cholesterol Cal: 20 mg/dL (ref 5–40)

## 2019-07-07 ENCOUNTER — Encounter: Payer: Self-pay | Admitting: Registered Nurse

## 2019-07-07 ENCOUNTER — Ambulatory Visit (INDEPENDENT_AMBULATORY_CARE_PROVIDER_SITE_OTHER): Payer: Medicare PPO | Admitting: Registered Nurse

## 2019-07-07 ENCOUNTER — Other Ambulatory Visit: Payer: Self-pay

## 2019-07-07 VITALS — BP 123/79 | HR 109 | Temp 98.3°F | Resp 18 | Ht 65.0 in | Wt 321.6 lb

## 2019-07-07 DIAGNOSIS — R0602 Shortness of breath: Secondary | ICD-10-CM

## 2019-07-07 DIAGNOSIS — J302 Other seasonal allergic rhinitis: Secondary | ICD-10-CM | POA: Diagnosis not present

## 2019-07-07 MED ORDER — FLUTICASONE PROPIONATE 50 MCG/ACT NA SUSP: 1.0000 | Freq: Every day | NASAL | 3 refills | Status: DC | PRN

## 2019-07-07 MED ORDER — ALBUTEROL SULFATE (2.5 MG/3ML) 0.083% IN NEBU: 2.5000 mg | INHALATION_SOLUTION | Freq: Four times a day (QID) | RESPIRATORY_TRACT | 0 refills | Status: AC | PRN

## 2019-07-07 MED ORDER — LORATADINE 10 MG PO TABS: 10.0000 mg | ORAL_TABLET | Freq: Every day | ORAL | 3 refills | Status: DC | PRN

## 2019-07-07 MED ORDER — FEXOFENADINE HCL 180 MG PO TABS: 180.0000 mg | ORAL_TABLET | Freq: Every day | ORAL | 3 refills | Status: AC

## 2019-07-07 NOTE — Progress Notes (Signed)
Established Patient Office Visit  Subjective:  Patient ID: Christine Parrish, female    DOB: Jun 11, 1945  Age: 74 y.o. MRN: [id number]  CC:  Chief Complaint  Patient presents with  . Follow-up    patient is here for a 1 week F/U for shortness of breathe and states she is fine and has no questions or concerns    HPI TATAYANA BESHEARS presents for 1 week follow up for shob  Was seen by PCP Dr. Pamella Pert last week after over a year. Has had multiple falls, shob, dizziness over that time. Xray revealed cardiomegaly, Dr. Pamella Pert has ordered echo. Pt reports she has not yet been contacted by that group.  She has started on carvedilol and furosemide with good effect. Pulse is at 109 - down from close to 120 last week. Reports she forgot lasix one time in past week - felt like she was full of fluid, trouble breathing,coughing. Relieved within short period after taking lasix.  Denies dependent edema, headaches, chest pain, claudication. Baseline poor vision, no changes.   Requesting handicapped parking placard so she can shop on her own - uses scooter, but unable to walk distance from parking lot to store comfortably without stopping.   Past Medical History:  Diagnosis Date  . Arthritis   . Asthma   . Cancer (HCC)    Cervical  . COPD (chronic obstructive pulmonary disease) ([place])   . DM (diabetes mellitus) ([place])   . Hyperlipidemia   . Hypertension   . Hypoxemia 09/25/2013  . Oxygen deficiency   . Sleep apnea    No CPAP (broken)    Past Surgical History:  Procedure Laterality Date  . ABDOMINAL HYSTERECTOMY    . COLONOSCOPY  2000?   Dr.Johnson  . COLONOSCOPY WITH PROPOFOL N/A 03/31/2018   Procedure: COLONOSCOPY WITH PROPOFOL;  Surgeon: Yetta Flock, MD;  Location: [facility];  Service: Gastroenterology;  Laterality: N/A;  . EYE SURGERY    . POLYPECTOMY  03/31/2018   Procedure: POLYPECTOMY;  Surgeon: Yetta Flock, MD;  Location: [facility];  Service: Gastroenterology;;   . TUBAL LIGATION      Family History  Problem Relation Age of Onset  . Emphysema Mother        never smoker  . CAD Mother 3  . Asthma Mother   . Colon polyps Mother   . Diabetes Father   . CAD Brother 48  . Asthma Daughter   . Asthma Sister   . Colon cancer Cousin        67's  . Esophageal cancer Neg Hx   . Rectal cancer Neg Hx   . Stomach cancer Neg Hx     Social History   Socioeconomic History  . Marital status: Widowed    Spouse name: Not on file  . Number of children: 7  . Years of education: 58  . Highest education level: Not on file  Occupational History  . Occupation: Retired  Tobacco Use  . Smoking status: Former Smoker    Packs/day: 1.50    Years: 35.00    Pack years: 52.50    Types: Cigarettes    Quit date: 01/23/2003    Years since quitting: 16.4  . Smokeless tobacco: Never Used  Vaping Use  . Vaping Use: Never used  Substance and Sexual Activity  . Alcohol use: Not Currently  . Drug use: No  . Sexual activity: Never  Other Topics Concern  . Not on file  Social History Narrative  Patient is single and her daughter lives with her.   Patient has six living children and one child is deceased.   Patient is retired.   Patient has a 10th grade education.   Patient is right-handed.   Patient drinks three cans of soda daily.         Social Determinants of Health   Financial Resource Strain:   . Difficulty of Paying Living Expenses:   Food Insecurity:   . Worried About Charity fundraiser in the Last Year:   . Arboriculturist in the Last Year:   Transportation Needs:   . Film/video editor (Medical):   Marland Kitchen Lack of Transportation (Non-Medical):   Physical Activity:   . Days of Exercise per Week:   . Minutes of Exercise per Session:   Stress:   . Feeling of Stress :   Social Connections:   . Frequency of Communication with Friends and Family:   . Frequency of Social Gatherings with Friends and Family:   . Attends Religious Services:   .  Active Member of Clubs or Organizations:   . Attends Archivist Meetings:   Marland Kitchen Marital Status:   Intimate Partner Violence:   . Fear of Current or Ex-Partner:   . Emotionally Abused:   Marland Kitchen Physically Abused:   . Sexually Abused:     Outpatient Medications Prior to Visit  Medication Sig Dispense Refill  . acetaminophen (TYLENOL) 500 MG tablet Take 1,000 mg by mouth every 6 (six) hours as needed for moderate pain.    Marland Kitchen albuterol (PROVENTIL) (2.5 MG/3ML) 0.083% nebulizer solution Take 3 mLs (2.5 mg total) by nebulization every 6 (six) hours as needed for wheezing or shortness of breath. 150 mL 0  . aspirin 81 MG tablet Take 81 mg by mouth daily.    Marland Kitchen atorvastatin (LIPITOR) 20 MG tablet Take 1 tablet by mouth once daily 90 tablet 0  . azithromycin (ZITHROMAX) 250 MG tablet Take 2 today then 1 po day 2-5 6 tablet 0  . benzonatate (TESSALON) 200 MG capsule Take 1 capsule (200 mg total) by mouth 2 (two) times daily as needed for cough. 20 capsule 0  . blood glucose meter kit and supplies Per insurance preference. Use to check blood glucose once a day. Dx E 11.22 1 each 11  . carvedilol (COREG) 3.125 MG tablet Take 1 tablet (3.125 mg total) by mouth 2 (two) times daily with a meal. 60 tablet 3  . diphenhydrAMINE (SOMINEX) 25 MG tablet Take 25-50 mg by mouth at bedtime as needed for sleep.    . fexofenadine-pseudoephedrine (ALLEGRA-D 24) 180-240 MG 24 hr tablet Take 1 tablet by mouth daily.    . fluticasone (FLONASE) 50 MCG/ACT nasal spray Place 1 spray into both nostrils daily as needed for allergies or rhinitis.    . furosemide (LASIX) 20 MG tablet Take 1 tablet (20 mg total) by mouth daily. 30 tablet 3  . loratadine (CLARITIN) 10 MG tablet Take 10 mg by mouth daily as needed for allergies.    . metFORMIN (GLUCOPHAGE) 500 MG tablet TAKE 1 TABLET BY MOUTH TWICE DAILY WITH MEALS 180 tablet 0  . OXYGEN Inhale 2 L into the lungs as needed. At home    . Pramoxine-Camphor-Zinc Acetate (ANTI ITCH  EX) Apply 1 application topically daily as needed (itching).    . nystatin cream (MYCOSTATIN) Apply 1 application topically 2 (two) times daily. (Patient not taking: Reported on 07/07/2019) 30 g 3  .  ranitidine (ZANTAC) 150 MG capsule Take 150 mg by mouth daily as needed for heartburn. (Patient not taking: Reported on 07/07/2019)    . traMADol (ULTRAM) 50 MG tablet Take 1 tablet (50 mg total) by mouth every 8 (eight) hours as needed. (Patient not taking: Reported on 07/07/2019) 15 tablet 0   No facility-administered medications prior to visit.    Allergies  Allergen Reactions  . Lisinopril Cough  . Losartan Swelling    ROS Review of Systems  Constitutional: Negative.   HENT: Negative.   Eyes: Negative.   Respiratory: Negative.   Cardiovascular: Negative.   Gastrointestinal: Negative.   Endocrine: Negative.   Genitourinary: Negative.   Musculoskeletal: Negative.   Skin: Negative.   Allergic/Immunologic: Negative.   Neurological: Negative.   Hematological: Negative.   Psychiatric/Behavioral: Negative.   All other systems reviewed and are negative.     Objective:    Physical Exam Vitals and nursing note reviewed.  Constitutional:      General: She is not in acute distress.    Appearance: Normal appearance. She is obese. She is diaphoretic. She is not ill-appearing or toxic-appearing.  Cardiovascular:     Rate and Rhythm: Normal rate and regular rhythm.     Heart sounds: Normal heart sounds. No murmur heard.  No friction rub. No gallop.   Pulmonary:     Effort: Pulmonary effort is normal. No respiratory distress.     Breath sounds: Normal breath sounds. No stridor. No wheezing, rhonchi or rales.  Chest:     Chest wall: No tenderness.  Skin:    Capillary Refill: Capillary refill takes less than 2 seconds.  Neurological:     General: No focal deficit present.     Mental Status: She is alert and oriented to person, place, and time.  Psychiatric:        Mood and Affect:  Mood normal.        Behavior: Behavior normal.        Thought Content: Thought content normal.        Judgment: Judgment normal.     BP 123/79   Pulse (!) 109   Temp 98.3 F (36.8 C) (Temporal)   Resp 18   Ht 5' 5"  (1.651 m)   Wt (!) 321 lb 9.6 oz (145.9 kg)   BMI 53.52 kg/m  Wt Readings from Last 3 Encounters:  07/07/19 (!) 321 lb 9.6 oz (145.9 kg)  06/30/19 (!) 319 lb 3.2 oz (144.8 kg)  11/11/18 (!) 324 lb (147 kg)     There are no preventive care reminders to display for this patient.  There are no preventive care reminders to display for this patient.  Lab Results  Component Value Date   TSH 1.370 06/30/2019   Lab Results  Component Value Date   WBC 9.8 04/04/2017   HGB 12.8 04/04/2017   HCT 38.6 04/04/2017   MCV 82 04/04/2017   PLT 305 04/04/2017   Lab Results  Component Value Date   NA 141 06/30/2019   K 3.6 06/30/2019   CO2 24 06/30/2019   GLUCOSE 168 (H) 06/30/2019   BUN 23 06/30/2019   CREATININE 1.66 (H) 06/30/2019   BILITOT 0.4 06/30/2019   ALKPHOS 116 06/30/2019   AST 17 06/30/2019   ALT 11 06/30/2019   PROT 8.4 06/30/2019   ALBUMIN 4.4 06/30/2019   CALCIUM 9.8 06/30/2019   Lab Results  Component Value Date   CHOL 148 06/30/2019   Lab Results  Component Value Date  HDL 59 06/30/2019   Lab Results  Component Value Date   LDLCALC 69 06/30/2019   Lab Results  Component Value Date   TRIG 115 06/30/2019   Lab Results  Component Value Date   CHOLHDL 2.5 06/30/2019   Lab Results  Component Value Date   HGBA1C 6.1 (H) 06/30/2019      Assessment & Plan:   Problem List Items Addressed This Visit    None    Visit Diagnoses    SOB (shortness of breath)    -  Primary   Relevant Medications   albuterol (PROVENTIL) (2.5 MG/3ML) 0.083% nebulizer solution   Other Relevant Orders   Ambulatory referral to Cardiology   Seasonal allergies       Relevant Medications   albuterol (PROVENTIL) (2.5 MG/3ML) 0.083% nebulizer solution    fluticasone (FLONASE) 50 MCG/ACT nasal spray   loratadine (CLARITIN) 10 MG tablet   fexofenadine (ALLEGRA) 180 MG tablet      No orders of the defined types were placed in this encounter.   Follow-up: No follow-ups on file.   PLAN  Pt improved from last week's visit, however, still has not had echo. Will refer to cardiology for expedition of this   Continue lasix and coreg - may consider increasing coreg dose for better control of heart rate.  Refill allergy meds as appropriate.   Patient encouraged to call clinic with any questions, comments, or concerns.  Maximiano Coss, NP

## 2019-07-07 NOTE — Patient Instructions (Signed)
° ° ° °  If you have lab work done today you will be contacted with your lab results within the next 2 weeks.  If you have not heard from us then please contact us. The fastest way to get your results is to register for My Chart. ° ° °IF you received an x-ray today, you will receive an invoice from [facility]. Please contact [facility] at [phone] with questions or concerns regarding your invoice.  ° °IF you received labwork today, you will receive an invoice from [facility]. Please contact [facility] at [phone] with questions or concerns regarding your invoice.  ° °Our billing staff will not be able to assist you with questions regarding bills from these companies. ° °You will be contacted with the lab results as soon as they are available. The fastest way to get your results is to activate your My Chart account. Instructions are located on the last page of this paperwork. If you have not heard from us regarding the results in 2 weeks, please contact this office. °  ° ° ° °

## 2019-07-13 ENCOUNTER — Encounter: Payer: Self-pay | Admitting: Cardiology

## 2019-07-13 ENCOUNTER — Ambulatory Visit: Payer: Medicare PPO | Admitting: Cardiology

## 2019-07-13 ENCOUNTER — Other Ambulatory Visit: Payer: Self-pay

## 2019-07-13 VITALS — BP 104/65 | HR 106 | Temp 96.6°F | Ht 65.0 in | Wt 320.4 lb

## 2019-07-13 DIAGNOSIS — I10 Essential (primary) hypertension: Secondary | ICD-10-CM | POA: Diagnosis not present

## 2019-07-13 DIAGNOSIS — R0602 Shortness of breath: Secondary | ICD-10-CM

## 2019-07-13 DIAGNOSIS — J449 Chronic obstructive pulmonary disease, unspecified: Secondary | ICD-10-CM | POA: Diagnosis not present

## 2019-07-13 DIAGNOSIS — E785 Hyperlipidemia, unspecified: Secondary | ICD-10-CM | POA: Diagnosis not present

## 2019-07-13 DIAGNOSIS — G4733 Obstructive sleep apnea (adult) (pediatric): Secondary | ICD-10-CM | POA: Diagnosis not present

## 2019-07-13 NOTE — Patient Instructions (Signed)
Medication Instructions:  Your physician recommends that you continue on your current medications as directed. Please refer to the Current Medication list given to you today.   *If you need a refill on your cardiac medications before your next appointment, please call your pharmacy*   Lab Work: BMET, Mag, BNP today  If you have labs (blood work) drawn today and your tests are completely normal, you will receive your results only by: Marland Kitchen MyChart Message (if you have MyChart) OR . A paper copy in the mail If you have any lab test that is abnormal or we need to change your treatment, we will call you to review the results.   Testing/Procedures: Your physician has requested that you have an echocardiogram. Echocardiography is a painless test that uses sound waves to create images of your heart. It provides your doctor with information about the size and shape of your heart and how well your heart's chambers and valves are working. This procedure takes approximately one hour. There are no restrictions for this procedure.  This will be done at our [facility] location:  [place]: At [facility], you and your health needs are our priority.  As part of our continuing mission to provide you with exceptional heart care, we have created designated Provider Care Teams.  These Care Teams include your primary Cardiologist (physician) and Advanced Practice Providers (APPs -  Physician Assistants and Nurse Practitioners) who all work together to provide you with the care you need, when you need it.  We recommend signing up for the patient portal called "MyChart".  Sign up information is provided on this After Visit Summary.  MyChart is used to connect with patients for Virtual Visits (Telemedicine).  Patients are able to view lab/test results, encounter notes, upcoming appointments, etc.  Non-urgent messages can be sent to your provider as well.   To learn more about  what you can do with MyChart, go to [email or web address].    Your next appointment:   1 month(s)  The format for your next appointment:   In Person  Provider:   Dr. Gardiner Rhyme

## 2019-07-13 NOTE — Progress Notes (Signed)
Cardiology Office Note:    Date:  07/15/2019   ID:  PATTIJO Parrish, DOB 01/14/1946, MRN [id number]  PCP:  Rutherford Guys, MD  Cardiologist:  No primary care provider on file.  Electrophysiologist:  None   Referring MD: Maximiano Coss, NP   Chief Complaint  Patient presents with  . Shortness of Breath    History of Present Illness:    Christine Parrish is a 74 y.o. female with a hx of COPD, diabetes, hypertension, hyperlipidemia, cervical cancer, asthma, OSA who is referred by Maximiano Coss, NP for evaluation of shortness of breath.  She reports she has been having dyspnea for past 2 years.  Has progressively worsened and now having dyspnea at rest.  She was recently started on Lasix and reports some improvement in her dyspnea.  Also states that she is having chest pain, describes as sharp pain on the left side of her chest.  Lasts a few seconds and resolves.  The most activity she does is walking around her home but does report she becomes short of breath with this.  Also has rare palpitations lasting few seconds and resolving.  Quit smoking in 2005.  Family history includes mother had MI in 36s.  Chest x-ray on 06/30/2019 showed chronic appearing increased lung markings without evidence of acute or active cardiopulmonary disease.  Labs on 06/30/2027 showed creatinine 1.66, A1c 6.1, LDL 69, TSH 1.4.   Wt Readings from Last 3 Encounters:  07/13/19 (!) 320 lb 6.4 oz (145.3 kg)  07/07/19 (!) 321 lb 9.6 oz (145.9 kg)  06/30/19 (!) 319 lb 3.2 oz (144.8 kg)    Past Medical History:  Diagnosis Date  . Arthritis   . Asthma   . Cancer (HCC)    Cervical  . COPD (chronic obstructive pulmonary disease) ([place])   . DM (diabetes mellitus) ([place])   . Hyperlipidemia   . Hypertension   . Hypoxemia 09/25/2013  . Oxygen deficiency   . Sleep apnea    No CPAP (broken)    Past Surgical History:  Procedure Laterality Date  . ABDOMINAL HYSTERECTOMY    . COLONOSCOPY  2000?   Dr.Johnson  .  COLONOSCOPY WITH PROPOFOL N/A 03/31/2018   Procedure: COLONOSCOPY WITH PROPOFOL;  Surgeon: Yetta Flock, MD;  Location: [facility];  Service: Gastroenterology;  Laterality: N/A;  . EYE SURGERY    . POLYPECTOMY  03/31/2018   Procedure: POLYPECTOMY;  Surgeon: Yetta Flock, MD;  Location: [facility];  Service: Gastroenterology;;  . TUBAL LIGATION      Current Medications: No outpatient medications have been marked as taking for the 07/13/19 encounter (Office Visit) with Donato Heinz, MD.     Allergies:   Lisinopril and Losartan   Social History   Socioeconomic History  . Marital status: Widowed    Spouse name: Not on file  . Number of children: 7  . Years of education: 14  . Highest education level: Not on file  Occupational History  . Occupation: Retired  Tobacco Use  . Smoking status: Former Smoker    Packs/day: 1.50    Years: 35.00    Pack years: 52.50    Types: Cigarettes    Quit date: 01/23/2003    Years since quitting: 16.4  . Smokeless tobacco: Never Used  Vaping Use  . Vaping Use: Never used  Substance and Sexual Activity  . Alcohol use: Not Currently  . Drug use: No  . Sexual activity: Never  Other Topics Concern  .  Not on file  Social History Narrative   Patient is single and her daughter lives with her.   Patient has six living children and one child is deceased.   Patient is retired.   Patient has a 10th grade education.   Patient is right-handed.   Patient drinks three cans of soda daily.         Social Determinants of Health   Financial Resource Strain:   . Difficulty of Paying Living Expenses:   Food Insecurity:   . Worried About Charity fundraiser in the Last Year:   . Arboriculturist in the Last Year:   Transportation Needs:   . Film/video editor (Medical):   Marland Kitchen Lack of Transportation (Non-Medical):   Physical Activity:   . Days of Exercise per Week:   . Minutes of Exercise per Session:   Stress:   . Feeling  of Stress :   Social Connections:   . Frequency of Communication with Friends and Family:   . Frequency of Social Gatherings with Friends and Family:   . Attends Religious Services:   . Active Member of Clubs or Organizations:   . Attends Archivist Meetings:   Marland Kitchen Marital Status:      Family History: The patient's family history includes Asthma in her daughter, mother, and sister; CAD (age of onset: 29) in her brother; CAD (age of onset: 22) in her mother; Colon cancer in her cousin; Colon polyps in her mother; Diabetes in her father; Emphysema in her mother. There is no history of Esophageal cancer, Rectal cancer, or Stomach cancer.  ROS:   Please see the history of present illness.     All other systems reviewed and are negative.  EKGs/Labs/Other Studies Reviewed:    The following studies were reviewed today:   EKG:  EKG is  ordered today.  The ekg ordered today demonstrates sinus rhythm, rate 106, right axis deviation, 1 mm inferior ST depressions  Recent Labs: 06/30/2019: ALT 11; TSH 1.370 07/13/2019: BNP 23.2; BUN 36; Creatinine, Ser 1.84; Magnesium 2.1; Potassium 3.4; Sodium 140  Recent Lipid Panel    Component Value Date/Time   CHOL 148 06/30/2019 1215   TRIG 115 06/30/2019 1215   HDL 59 06/30/2019 1215   CHOLHDL 2.5 06/30/2019 1215   CHOLHDL 3.0 11/05/2014 1117   VLDL 21 11/05/2014 1117   LDLCALC 69 06/30/2019 1215    Physical Exam:    VS:  BP 104/65   Pulse (!) 106   Temp (!) 96.6 F (35.9 C)   Ht 5\' 5"  (1.651 m)   Wt (!) 320 lb 6.4 oz (145.3 kg)   SpO2 91%   BMI 53.32 kg/m     Wt Readings from Last 3 Encounters:  07/13/19 (!) 320 lb 6.4 oz (145.3 kg)  07/07/19 (!) 321 lb 9.6 oz (145.9 kg)  06/30/19 (!) 319 lb 3.2 oz (144.8 kg)     GEN:  in no acute distress HEENT: Normal NECK: No JVD appreciated though difficult to assess given body habitus LYMPHATICS: No lymphadenopathy CARDIAC: Regular, tachycardic, no murmurs, rubs, gallops RESPIRATORY:   Clear to auscultation without rales, wheezing or rhonchi  ABDOMEN: Soft, non-tender, non-distended MUSCULOSKELETAL:  No edema; No deformity  SKIN: Warm and dry NEUROLOGIC:  Alert and oriented x 3 PSYCHIATRIC:  Normal affect   ASSESSMENT:    1. Shortness of breath   2. Essential hypertension   3. Hyperlipidemia, unspecified hyperlipidemia type    PLAN:  Dyspnea: Unclear cause.  Suspect deconditioning and COPD contributing.  Difficult to assess volume status on exam.  Does report some improvement in her shortness of breath with starting Lasix.  Will check TTE to evaluate for structural heart disease.  Will check BNP, BMP, magnesium  Hypertension: Appears controlled, continue carvedilol 3.25 mg twice daily  Hyperlipidemia: On atorvastatin 20 mg daily.  LDL 69 on 06/30/2019  Prediabetes: A1c 6.1.  On Metformin  RTC in 1 month   Medication Adjustments/Labs and Tests Ordered: Current medicines are reviewed at length with the patient today.  Concerns regarding medicines are outlined above.  Orders Placed This Encounter  Procedures  . Basic metabolic panel  . Magnesium  . Brain natriuretic peptide  . EKG 12-Lead  . ECHOCARDIOGRAM COMPLETE   No orders of the defined types were placed in this encounter.   Patient Instructions  Medication Instructions:  Your physician recommends that you continue on your current medications as directed. Please refer to the Current Medication list given to you today.   *If you need a refill on your cardiac medications before your next appointment, please call your pharmacy*   Lab Work: BMET, Mag, BNP today  If you have labs (blood work) drawn today and your tests are completely normal, you will receive your results only by: Marland Kitchen MyChart Message (if you have MyChart) OR . A paper copy in the mail If you have any lab test that is abnormal or we need to change your treatment, we will call you to review the results.   Testing/Procedures: Your  physician has requested that you have an echocardiogram. Echocardiography is a painless test that uses sound waves to create images of your heart. It provides your doctor with information about the size and shape of your heart and how well your heart's chambers and valves are working. This procedure takes approximately one hour. There are no restrictions for this procedure.  This will be done at our [facility] location:  [place]: At [facility], you and your health needs are our priority.  As part of our continuing mission to provide you with exceptional heart care, we have created designated Provider Care Teams.  These Care Teams include your primary Cardiologist (physician) and Advanced Practice Providers (APPs -  Physician Assistants and Nurse Practitioners) who all work together to provide you with the care you need, when you need it.  We recommend signing up for the patient portal called "MyChart".  Sign up information is provided on this After Visit Summary.  MyChart is used to connect with patients for Virtual Visits (Telemedicine).  Patients are able to view lab/test results, encounter notes, upcoming appointments, etc.  Non-urgent messages can be sent to your provider as well.   To learn more about what you can do with MyChart, go to [email or web address].    Your next appointment:   1 month(s)  The format for your next appointment:   In Person  Provider:   Dr. Gardiner Rhyme       Signed, Donato Heinz, MD  07/15/2019 6:03 PM    [place]

## 2019-07-14 LAB — BASIC METABOLIC PANEL
BUN/Creatinine Ratio: 20 (ref 12–28)
BUN: 36 mg/dL — ABNORMAL HIGH (ref 8–27)
CO2: 25 mmol/L (ref 20–29)
Calcium: 9.9 mg/dL (ref 8.7–10.3)
Chloride: 96 mmol/L (ref 96–106)
Creatinine, Ser: 1.84 mg/dL — ABNORMAL HIGH (ref 0.57–1.00)
GFR calc Af Amer: 31 mL/min/{1.73_m2} — ABNORMAL LOW (ref >59)
GFR calc non Af Amer: 27 mL/min/{1.73_m2} — ABNORMAL LOW (ref >59)
Glucose: 131 mg/dL — ABNORMAL HIGH (ref 65–99)
Potassium: 3.4 mmol/L — ABNORMAL LOW (ref 3.5–5.2)
Sodium: 140 mmol/L (ref 134–144)

## 2019-07-14 LAB — BRAIN NATRIURETIC PEPTIDE: BNP: 23.2 pg/mL (ref 0.0–100.0)

## 2019-07-14 LAB — MAGNESIUM: Magnesium: 2.1 mg/dL (ref 1.6–2.3)

## 2019-07-20 ENCOUNTER — Other Ambulatory Visit: Payer: Self-pay | Admitting: *Deleted

## 2019-07-20 MED ORDER — FUROSEMIDE 20 MG PO TABS: 20.0000 mg | ORAL_TABLET | ORAL | 3 refills | Status: AC | PRN

## 2019-08-05 ENCOUNTER — Other Ambulatory Visit (HOSPITAL_COMMUNITY): Payer: Medicare PPO

## 2019-08-07 ENCOUNTER — Other Ambulatory Visit: Payer: Self-pay | Admitting: Family Medicine

## 2019-08-12 DIAGNOSIS — J449 Chronic obstructive pulmonary disease, unspecified: Secondary | ICD-10-CM | POA: Diagnosis not present

## 2019-08-12 DIAGNOSIS — G4733 Obstructive sleep apnea (adult) (pediatric): Secondary | ICD-10-CM | POA: Diagnosis not present

## 2019-08-17 NOTE — Progress Notes (Deleted)
Cardiology Office Note:    Date:  08/17/2019   ID:  [name], DOB 1945/12/23, MRN [id number]  PCP:  Rutherford Guys, MD  Cardiologist:  Donato Heinz, MD  Electrophysiologist:  None   Referring MD: Rutherford Guys, MD   No chief complaint on file.   History of Present Illness:    Christine Parrish is a 74 y.o. female with a hx of COPD, diabetes, hypertension, hyperlipidemia, cervical cancer, asthma, OSA who presents for follow-up.  She was referred by Maximiano Coss, NP for evaluation of shortness of breath, initially seen on 07/13/2019.  She reports she has been having dyspnea for past 2 years.  Has progressively worsened and now having dyspnea at rest.  She was recently started on Lasix and reports some improvement in her dyspnea.  Also states that she is having chest pain, describes as sharp pain on the left side of her chest.  Lasts a few seconds and resolves.  The most activity she does is walking around her home but does report she becomes short of breath with this.  Also has rare palpitations lasting few seconds and resolving.  Quit smoking in 2005.  Family history includes mother had MI in 82s.  Chest x-ray on 06/30/2019 showed chronic appearing increased lung markings without evidence of acute or active cardiopulmonary disease.  Labs on 06/30/2027 showed creatinine 1.66, A1c 6.1, LDL 69, TSH 1.4.  Labs on 07/13/2019 showed BNP 23, creatinine was up to 1.84.  Lasix was held and was recommended that she monitor daily weights and call if gaining more than 3 pounds in a day or 5 pounds in a week.   Wt Readings from Last 3 Encounters:  07/13/19 (!) 320 lb 6.4 oz (145.3 kg)  07/07/19 (!) 321 lb 9.6 oz (145.9 kg)  06/30/19 (!) 319 lb 3.2 oz (144.8 kg)    Past Medical History:  Diagnosis Date  . Arthritis   . Asthma   . Cancer (HCC)    Cervical  . COPD (chronic obstructive pulmonary disease) ([place])   . DM (diabetes mellitus) ([place])   . Hyperlipidemia   . Hypertension   .  Hypoxemia 09/25/2013  . Oxygen deficiency   . Sleep apnea    No CPAP (broken)    Past Surgical History:  Procedure Laterality Date  . ABDOMINAL HYSTERECTOMY    . COLONOSCOPY  2000?   Dr.Johnson  . COLONOSCOPY WITH PROPOFOL N/A 03/31/2018   Procedure: COLONOSCOPY WITH PROPOFOL;  Surgeon: Yetta Flock, MD;  Location: [facility];  Service: Gastroenterology;  Laterality: N/A;  . EYE SURGERY    . POLYPECTOMY  03/31/2018   Procedure: POLYPECTOMY;  Surgeon: Yetta Flock, MD;  Location: [facility];  Service: Gastroenterology;;  . TUBAL LIGATION      Current Medications: No outpatient medications have been marked as taking for the 08/18/19 encounter (Appointment) with Donato Heinz, MD.     Allergies:   Lisinopril and Losartan   Social History   Socioeconomic History  . Marital status: Widowed    Spouse name: Not on file  . Number of children: 7  . Years of education: 8  . Highest education level: Not on file  Occupational History  . Occupation: Retired  Tobacco Use  . Smoking status: Former Smoker    Packs/day: 1.50    Years: 35.00    Pack years: 52.50    Types: Cigarettes    Quit date: 01/23/2003    Years since quitting: 16.5  .  Smokeless tobacco: Never Used  Vaping Use  . Vaping Use: Never used  Substance and Sexual Activity  . Alcohol use: Not Currently  . Drug use: No  . Sexual activity: Never  Other Topics Concern  . Not on file  Social History Narrative   Patient is single and her daughter lives with her.   Patient has six living children and one child is deceased.   Patient is retired.   Patient has a 10th grade education.   Patient is right-handed.   Patient drinks three cans of soda daily.         Social Determinants of Health   Financial Resource Strain:   . Difficulty of Paying Living Expenses:   Food Insecurity:   . Worried About Charity fundraiser in the Last Year:   . Arboriculturist in the Last Year:   Transportation  Needs:   . Film/video editor (Medical):   Marland Kitchen Lack of Transportation (Non-Medical):   Physical Activity:   . Days of Exercise per Week:   . Minutes of Exercise per Session:   Stress:   . Feeling of Stress :   Social Connections:   . Frequency of Communication with Friends and Family:   . Frequency of Social Gatherings with Friends and Family:   . Attends Religious Services:   . Active Member of Clubs or Organizations:   . Attends Archivist Meetings:   Marland Kitchen Marital Status:      Family History: The patient's family history includes Asthma in her daughter, mother, and sister; CAD (age of onset: 55) in her brother; CAD (age of onset: 41) in her mother; Colon cancer in her cousin; Colon polyps in her mother; Diabetes in her father; Emphysema in her mother. There is no history of Esophageal cancer, Rectal cancer, or Stomach cancer.  ROS:   Please see the history of present illness.     All other systems reviewed and are negative.  EKGs/Labs/Other Studies Reviewed:    The following studies were reviewed today:   EKG:  EKG is  ordered today.  The ekg ordered today demonstrates sinus rhythm, rate 106, right axis deviation, 1 mm inferior ST depressions  Recent Labs: 06/30/2019: ALT 11; TSH 1.370 07/13/2019: BNP 23.2; BUN 36; Creatinine, Ser 1.84; Magnesium 2.1; Potassium 3.4; Sodium 140  Recent Lipid Panel    Component Value Date/Time   CHOL 148 06/30/2019 1215   TRIG 115 06/30/2019 1215   HDL 59 06/30/2019 1215   CHOLHDL 2.5 06/30/2019 1215   CHOLHDL 3.0 11/05/2014 1117   VLDL 21 11/05/2014 1117   LDLCALC 69 06/30/2019 1215    Physical Exam:    VS:  There were no vitals taken for this visit.    Wt Readings from Last 3 Encounters:  07/13/19 (!) 320 lb 6.4 oz (145.3 kg)  07/07/19 (!) 321 lb 9.6 oz (145.9 kg)  06/30/19 (!) 319 lb 3.2 oz (144.8 kg)     GEN:  in no acute distress HEENT: Normal NECK: No JVD appreciated though difficult to assess given body  habitus LYMPHATICS: No lymphadenopathy CARDIAC: Regular, tachycardic, no murmurs, rubs, gallops RESPIRATORY:  Clear to auscultation without rales, wheezing or rhonchi  ABDOMEN: Soft, non-tender, non-distended MUSCULOSKELETAL:  No edema; No deformity  SKIN: Warm and dry NEUROLOGIC:  Alert and oriented x 3 PSYCHIATRIC:  Normal affect   ASSESSMENT:    No diagnosis found. PLAN:    Dyspnea: Unclear cause.  Suspect deconditioning and COPD contributing.  BNP 23, argues against hypervolemia, though patient is obese.  TTE pending to evaluate for structural heart disease.    Hypertension: Appears controlled, continue carvedilol 3.25 mg twice daily  Hyperlipidemia: On atorvastatin 20 mg daily.  LDL 69 on 06/30/2019  Prediabetes: A1c 6.1.  On Metformin  RTC in ***   Medication Adjustments/Labs and Tests Ordered: Current medicines are reviewed at length with the patient today.  Concerns regarding medicines are outlined above.  No orders of the defined types were placed in this encounter.  No orders of the defined types were placed in this encounter.   There are no Patient Instructions on file for this visit.   Signed, Donato Heinz, MD  08/17/2019 5:06 PM    [facility]

## 2019-08-18 ENCOUNTER — Ambulatory Visit: Payer: Medicare PPO | Admitting: Cardiology

## 2019-08-20 DIAGNOSIS — J45909 Unspecified asthma, uncomplicated: Secondary | ICD-10-CM | POA: Diagnosis not present

## 2019-08-20 DIAGNOSIS — E119 Type 2 diabetes mellitus without complications: Secondary | ICD-10-CM | POA: Diagnosis not present

## 2019-08-20 DIAGNOSIS — D869 Sarcoidosis, unspecified: Secondary | ICD-10-CM | POA: Diagnosis not present

## 2019-08-20 DIAGNOSIS — M1990 Unspecified osteoarthritis, unspecified site: Secondary | ICD-10-CM | POA: Diagnosis not present

## 2019-08-20 DIAGNOSIS — Z008 Encounter for other general examination: Secondary | ICD-10-CM | POA: Diagnosis not present

## 2019-08-20 DIAGNOSIS — Z0000 Encounter for general adult medical examination without abnormal findings: Secondary | ICD-10-CM | POA: Diagnosis not present

## 2019-08-20 DIAGNOSIS — R32 Unspecified urinary incontinence: Secondary | ICD-10-CM | POA: Diagnosis not present

## 2019-08-20 DIAGNOSIS — G4733 Obstructive sleep apnea (adult) (pediatric): Secondary | ICD-10-CM | POA: Diagnosis not present

## 2019-08-26 ENCOUNTER — Other Ambulatory Visit: Payer: Self-pay

## 2019-08-26 ENCOUNTER — Ambulatory Visit (HOSPITAL_COMMUNITY): Payer: Medicare PPO | Attending: Cardiovascular Disease

## 2019-08-26 DIAGNOSIS — R0602 Shortness of breath: Secondary | ICD-10-CM | POA: Insufficient documentation

## 2019-08-26 LAB — ECHOCARDIOGRAM COMPLETE
Area-P 1/2: 3.06 cm2
S' Lateral: 2.8 cm

## 2019-09-05 ENCOUNTER — Other Ambulatory Visit: Payer: Self-pay | Admitting: Family Medicine

## 2019-09-05 DIAGNOSIS — I10 Essential (primary) hypertension: Secondary | ICD-10-CM

## 2019-09-10 ENCOUNTER — Other Ambulatory Visit: Payer: Self-pay | Admitting: Family Medicine

## 2019-09-10 DIAGNOSIS — I10 Essential (primary) hypertension: Secondary | ICD-10-CM

## 2019-09-10 NOTE — Telephone Encounter (Signed)
Pt last seen dr Pamella Pert in June 2021. Pt needs a refill on hctz. Pt would like 90 day supply

## 2019-09-11 ENCOUNTER — Telehealth: Payer: Self-pay | Admitting: Family Medicine

## 2019-09-11 NOTE — Telephone Encounter (Signed)
What is the name of the medication? hydrochlorothiazide (HYDRODIURIL) 25 MG tablet [Pharmacy Med Name: hydroCHLOROthiazide 25 MG Oral Tablet] [[id number]]    Have you contacted your pharmacy to request a refill? Pt was told this--Request refused: Refill not appropriate (d/c'd by Dr Pamella Pert 06/30/19) and I'm not sure why  Which pharmacy would you like this sent to? [facility], [place].  [address] [place] [address]  Phone:  [phone] Fax:  [phone]       Patient notified that their request is being sent to the clinical staff for review and that they should receive a call once it is complete. If they do not receive a call within 72 hours they can check with their pharmacy or our office.

## 2019-09-11 NOTE — Telephone Encounter (Signed)
I have called pt back and informed her that she was taken off of the HCTZ because she is now on the Lasix. Pt stated understanding.   Confirmed by provider.

## 2019-09-12 DIAGNOSIS — G4733 Obstructive sleep apnea (adult) (pediatric): Secondary | ICD-10-CM | POA: Diagnosis not present

## 2019-09-12 DIAGNOSIS — J449 Chronic obstructive pulmonary disease, unspecified: Secondary | ICD-10-CM | POA: Diagnosis not present

## 2019-09-13 ENCOUNTER — Other Ambulatory Visit: Payer: Self-pay | Admitting: Family Medicine

## 2019-09-13 DIAGNOSIS — E1122 Type 2 diabetes mellitus with diabetic chronic kidney disease: Secondary | ICD-10-CM

## 2019-09-13 NOTE — Telephone Encounter (Signed)
Requested Prescriptions  Pending Prescriptions Disp Refills   metFORMIN (GLUCOPHAGE) 500 MG tablet [Pharmacy Med Name: metFORMIN HCl 500 MG Oral Tablet] 180 tablet 0    Sig: TAKE 1 TABLET BY MOUTH TWICE DAILY WITH MEALS     Endocrinology:  Diabetes - Biguanides Failed - 09/13/2019  3:28 PM      Failed - Cr in normal range and within 360 days    Creat  Date Value Ref Range Status  04/05/2015 1.05 (H) 0.60 - 0.93 mg/dL Final   Creatinine, Ser  Date Value Ref Range Status  07/13/2019 1.84 (H) 0.57 - 1.00 mg/dL Final         Failed - eGFR in normal range and within 360 days    GFR, Est African American  Date Value Ref Range Status  04/05/2015 62 >=60 mL/min Final   GFR calc Af Amer  Date Value Ref Range Status  07/13/2019 31 (L) >59 mL/min/1.73 Final    Comment:    **[facility] currently reports eGFR in compliance with the current**   recommendations of the [employer]. [facility] will   update reporting as new guidelines are published from the NKF-ASN   Task force.    GFR, Est Non African American  Date Value Ref Range Status  04/05/2015 54 (L) >=60 mL/min Final    Comment:      The estimated GFR is a calculation valid for adults (>=51 years old) that uses the CKD-EPI algorithm to adjust for age and sex. It is   not to be used for children, pregnant women, hospitalized patients,    patients on dialysis, or with rapidly changing kidney function. According to the NKDEP, eGFR >89 is normal, 60-89 shows mild impairment, 30-59 shows moderate impairment, 15-29 shows severe impairment and <15 is ESRD.      GFR calc non Af Amer  Date Value Ref Range Status  07/13/2019 27 (L) >59 mL/min/1.73 Final         Passed - HBA1C is between 0 and 7.9 and within 180 days    Hgb A1c MFr Bld  Date Value Ref Range Status  06/30/2019 6.1 (H) 4.8 - 5.6 % Final    Comment:             Prediabetes: 5.7 - 6.4          Diabetes: >6.4          Glycemic control for adults with  diabetes: <7.0          Passed - Valid encounter within last 6 months    Recent Outpatient Visits          2 months ago SOB (shortness of breath)   [facility], NP   2 months ago SOB (shortness of breath)   [facility], Lilia Argue, MD   10 months ago Medicare annual wellness visit, subsequent   [facility], Lilia Argue, MD   1 year ago Cough   [facility], Rex Kras, MD   1 year ago Pain, joint, foot, right   [facility], Ranell Patrick, MD      Future Appointments            In 1 week Donato Heinz, MD [facility], [facility]

## 2019-09-20 NOTE — Progress Notes (Signed)
Cardiology Office Note:    Date:  09/23/2019   ID:  Christine Parrish, DOB [birth year], MRN [id number]  PCP:  Rutherford Guys, MD  Cardiologist:  Donato Heinz, MD  Electrophysiologist:  None   Referring MD: Rutherford Guys, MD   Chief Complaint  Patient presents with   Shortness of Breath    History of Present Illness:    Christine Parrish is a 74 y.o. female with a hx of COPD, diabetes, hypertension, hyperlipidemia, cervical cancer, asthma, OSA who presents for follow-up.  She was referred by Maximiano Coss, NP for evaluation of shortness of breath, initially seen on 07/13/2019.  She reports she has been having dyspnea for past 2 years.  Has progressively worsened and now having dyspnea at rest.  She was recently started on Lasix and reports some improvement in her dyspnea.  Also states that she is having chest pain, describes as sharp pain on the left side of her chest.  Lasts a few seconds and resolves.  The most activity she does is walking around her home but does report she becomes short of breath with this.  Also has rare palpitations lasting few seconds and resolving.  Quit smoking in 2005.  Family history includes mother had MI in 73s.  Chest x-ray on 06/30/2019 showed chronic appearing increased lung markings without evidence of acute or active cardiopulmonary disease.  Labs on 06/30/2027 showed creatinine 1.66, A1c 6.1, LDL 69, TSH 1.4.  Echocardiogram on 08/26/2019 showed normal LV function, mildly reduced RV function, grade 1 diastolic dysfunction, no significant valvular disease.  Since last clinic visit, reports dyspnea is unchanged.  She has not been weighing herself at home.  Denies any chest pain.  Reports recent syncopal episode.  Occurred 1 month ago.  No prodromal symptoms prior to episode.  Fell to the ground, did not hit her head.  States she had to wait on the ground for help, was on the floor for 3 to 4 hours.     Wt Readings from Last 3 Encounters:  09/22/19 (!)  333 lb (151 kg)  07/13/19 (!) 320 lb 6.4 oz (145.3 kg)  07/07/19 (!) 321 lb 9.6 oz (145.9 kg)   BP Readings from Last 3 Encounters:  09/22/19 (!) 153/81  07/13/19 104/65  07/07/19 123/79     Past Medical History:  Diagnosis Date   Arthritis    Asthma    Cancer ([place])    Cervical   COPD (chronic obstructive pulmonary disease) ([place])    DM (diabetes mellitus) ([place])    Hyperlipidemia    Hypertension    Hypoxemia 09/25/2013   Oxygen deficiency    Sleep apnea    No CPAP (broken)    Past Surgical History:  Procedure Laterality Date   ABDOMINAL HYSTERECTOMY     COLONOSCOPY  2000?   Dr.Johnson   COLONOSCOPY WITH PROPOFOL N/A 03/31/2018   Procedure: COLONOSCOPY WITH PROPOFOL;  Surgeon: Yetta Flock, MD;  Location: [facility];  Service: Gastroenterology;  Laterality: N/A;   EYE SURGERY     POLYPECTOMY  03/31/2018   Procedure: POLYPECTOMY;  Surgeon: Yetta Flock, MD;  Location: [facility];  Service: Gastroenterology;;   TUBAL LIGATION      Current Medications: Current Meds  Medication Sig   acetaminophen (TYLENOL) 500 MG tablet Take 1,000 mg by mouth every 6 (six) hours as needed for moderate pain.   albuterol (PROVENTIL) (2.5 MG/3ML) 0.083% nebulizer solution Take 3 mLs (2.5 mg total) by nebulization every  6 (six) hours as needed for wheezing or shortness of breath.   aspirin 81 MG tablet Take 81 mg by mouth daily.   atorvastatin (LIPITOR) 20 MG tablet Take 1 tablet by mouth once daily   azithromycin (ZITHROMAX) 250 MG tablet Take 2 today then 1 po day 2-5   benzonatate (TESSALON) 200 MG capsule Take 1 capsule (200 mg total) by mouth 2 (two) times daily as needed for cough.   blood glucose meter kit and supplies Per insurance preference. Use to check blood glucose once a day. Dx E 11.22   carvedilol (COREG) 6.25 MG tablet Take 1 tablet (6.25 mg total) by mouth 2 (two) times daily with a meal.   diphenhydrAMINE (SOMINEX) 25 MG tablet Take  25-50 mg by mouth at bedtime as needed for sleep.   fexofenadine (ALLEGRA) 180 MG tablet Take 1 tablet (180 mg total) by mouth daily.   fexofenadine-pseudoephedrine (ALLEGRA-D 24) 180-240 MG 24 hr tablet Take 1 tablet by mouth daily.   fluticasone (FLONASE) 50 MCG/ACT nasal spray Place 1 spray into both nostrils daily as needed for allergies or rhinitis.   furosemide (LASIX) 20 MG tablet Take 1 tablet (20 mg total) by mouth as needed (if weight increases 3 lbs overnight or 5 lbs in 1 week).   loratadine (CLARITIN) 10 MG tablet Take 1 tablet (10 mg total) by mouth daily as needed for allergies.   metFORMIN (GLUCOPHAGE) 500 MG tablet TAKE 1 TABLET BY MOUTH TWICE DAILY WITH MEALS   OXYGEN Inhale 2 L into the lungs as needed. At home   Pramoxine-Camphor-Zinc Acetate (ANTI ITCH EX) Apply 1 application topically daily as needed (itching).   [DISCONTINUED] carvedilol (COREG) 3.125 MG tablet Take 1 tablet (3.125 mg total) by mouth 2 (two) times daily with a meal.     Allergies:   Lisinopril and Losartan   Social History   Socioeconomic History   Marital status: Widowed    Spouse name: Not on file   Number of children: 7   Years of education: 10   Highest education level: Not on file  Occupational History   Occupation: Retired  Tobacco Use   Smoking status: Former Smoker    Packs/day: 1.50    Years: 35.00    Pack years: 52.50    Types: Cigarettes    Quit date: 01/23/2003    Years since quitting: 16.6   Smokeless tobacco: Never Used  Scientific laboratory technician Use: Never used  Substance and Sexual Activity   Alcohol use: Not Currently   Drug use: No   Sexual activity: Never  Other Topics Concern   Not on file  Social History Narrative   Patient is single and her daughter lives with her.   Patient has six living children and one child is deceased.   Patient is retired.   Patient has a 10th grade education.   Patient is right-handed.   Patient drinks three cans of soda  daily.         Social Determinants of Health   Financial Resource Strain:    Difficulty of Paying Living Expenses: Not on file  Food Insecurity:    Worried About Charity fundraiser in the Last Year: Not on file   [employer] of Food in the Last Year: Not on file  Transportation Needs:    Lack of Transportation (Medical): Not on file   Lack of Transportation (Non-Medical): Not on file  Physical Activity:    Days of Exercise per  Week: Not on file   Minutes of Exercise per Session: Not on file  Stress:    Feeling of Stress : Not on file  Social Connections:    Frequency of Communication with Friends and Family: Not on file   Frequency of Social Gatherings with Friends and Family: Not on file   Attends Religious Services: Not on file   Active Member of Clubs or Organizations: Not on file   Attends Archivist Meetings: Not on file   Marital Status: Not on file     Family History: The patient's family history includes Asthma in her daughter, mother, and sister; CAD (age of onset: 44) in her brother; CAD (age of onset: 33) in her mother; Colon cancer in her cousin; Colon polyps in her mother; Diabetes in her father; Emphysema in her mother. There is no history of Esophageal cancer, Rectal cancer, or Stomach cancer.  ROS:   Please see the history of present illness.     All other systems reviewed and are negative.  EKGs/Labs/Other Studies Reviewed:    The following studies were reviewed today:   EKG:  EKG is  ordered today.  The ekg ordered today demonstrates sinus rhythm, rate 106, right axis deviation, 1 mm inferior ST depressions  Recent Labs: 06/30/2019: ALT 11; TSH 1.370 07/13/2019: BNP 23.2; BUN 36; Creatinine, Ser 1.84; Magnesium 2.1; Potassium 3.4; Sodium 140  Recent Lipid Panel    Component Value Date/Time   CHOL 148 06/30/2019 1215   TRIG 115 06/30/2019 1215   HDL 59 06/30/2019 1215   CHOLHDL 2.5 06/30/2019 1215   CHOLHDL 3.0 11/05/2014 1117    VLDL 21 11/05/2014 1117   LDLCALC 69 06/30/2019 1215    Physical Exam:    VS:  BP (!) 153/81    Pulse 87    Ht 5' 5"  (1.651 m)    Wt (!) 333 lb (151 kg)    SpO2 96%    BMI 55.41 kg/m     Wt Readings from Last 3 Encounters:  09/22/19 (!) 333 lb (151 kg)  07/13/19 (!) 320 lb 6.4 oz (145.3 kg)  07/07/19 (!) 321 lb 9.6 oz (145.9 kg)     GEN:  in no acute distress HEENT: Normal NECK: No JVD appreciated though difficult to assess given body habitus LYMPHATICS: No lymphadenopathy CARDIAC: Regular, tachycardic, no murmurs, rubs, gallops RESPIRATORY:  Clear to auscultation without rales, wheezing or rhonchi  ABDOMEN: Soft, non-tender, non-distended MUSCULOSKELETAL:  No edema; No deformity  SKIN: Warm and dry NEUROLOGIC:  Alert and oriented x 3 PSYCHIATRIC:  Normal affect   ASSESSMENT:    1. Shortness of breath   2. Syncope and collapse   3. Essential hypertension   4. Hyperlipidemia, unspecified hyperlipidemia type    PLAN:    Dyspnea: Unclear cause.  Suspect deconditioning and COPD contributing.  Echocardiogram on 08/26/2019 showed normal LV function, mildly reduced RV function, grade 1 diastolic dysfunction, no significant valvular disease.  BNP 23 on 07/13/19.  Has been started on Lasix, was noted to have worsening renal function, Lasix held and recommended to monitor daily weights and restart if gaining more than 3 pounds in a day or 5 pounds in a week.  Reports worsening dyspnea, recommend taking Lasix for 3 days to see if symptoms improved.  Could represent anginal equivalent, will check Lexiscan Myoview to rule out ischemia.  Syncope: Episode occurred 1 month ago, reports no prodromal symptoms.  Echocardiogram on 08/26/2019 showed normal LV function, mildly reduced RV  function, grade 1 diastolic dysfunction, no significant valvular disease.  Check Zio patch x2 weeks to evaluate for arrhythmia.  Hypertension: BP elevated in clinic today, will increase carvedilol to 6.25 mg twice  daily  Hyperlipidemia: On atorvastatin 20 mg daily.  LDL 69 on 06/30/2019  Prediabetes: A1c 6.1.  On Metformin  RTC in 3 months   Medication Adjustments/Labs and Tests Ordered: Current medicines are reviewed at length with the patient today.  Concerns regarding medicines are outlined above.  Orders Placed This Encounter  Procedures   Basic metabolic panel   LONG TERM MONITOR-LIVE TELEMETRY (3-14 DAYS)   MYOCARDIAL PERFUSION IMAGING   Meds ordered this encounter  Medications   carvedilol (COREG) 6.25 MG tablet    Sig: Take 1 tablet (6.25 mg total) by mouth 2 (two) times daily with a meal.    Dispense:  180 tablet    Refill:  3    Patient Instructions  Medication Instructions:  INCREASE carvedilol (Coreg) 6.25 mg two times daily  Take Lasix daily for the next 3 days.  *If you need a refill on your cardiac medications before your next appointment, please call your pharmacy*   Lab Work: BMET today  If you have labs (blood work) drawn today and your tests are completely normal, you will receive your results only by:  [place] (if you have MyChart) OR  A paper copy in the mail If you have any lab test that is abnormal or we need to change your treatment, we will call you to review the results.   Testing/Procedures: Your physician has requested that you have a lexiscan myoview (2 day, [address]). For further information please visit [email or web address]. Please follow instruction sheet, as given.   ZIO XT- Long Term Monitor Instructions   Your physician has requested you wear your ZIO patch monitor 14 days.   This is a single patch monitor.  [employer] supplies one patch monitor per enrollment.  Additional stickers are not available.   Please do not apply patch if you will be having a Nuclear Stress Test, Echocardiogram, Cardiac CT, MRI, or Chest Xray during the time frame you would be wearing the monitor. The patch cannot be worn during these tests.  You  cannot remove and re-apply the ZIO XT patch monitor.   Your ZIO patch monitor will be sent USPS Priority mail from [facility] directly to your home address. The monitor may also be mailed to a PO BOX if home delivery is not available.   It may take 3-5 days to receive your monitor after you have been enrolled.   Once you have received you monitor, please review enclosed instructions.  Your monitor has already been registered assigning a specific monitor serial # to you.   Applying the monitor   Shave hair from upper left chest.   Hold abrader disc by orange tab.  Rub abrader in 40 strokes over left upper chest as indicated in your monitor instructions.   Clean area with 4 enclosed alcohol pads .  Use all pads to assure are is cleaned thoroughly.  Let dry.   Apply patch as indicated in monitor instructions.  Patch will be place under collarbone on left side of chest with arrow pointing upward.   Rub patch adhesive wings for 2 minutes.Remove white label marked "1".  Remove white label marked "2".  Rub patch adhesive wings for 2 additional minutes.   While looking in a mirror, press and release button in center of patch.  A small green light will flash 3-4 times .  This will be your only indicator the monitor has been turned on.     Do not shower for the first 24 hours.  You may shower after the first 24 hours.   Press button if you feel a symptom. You will hear a small click.  Record Date, Time and Symptom in the Patient Log Book.   When you are ready to remove patch, follow instructions on last 2 pages of Patient Log Book.  Stick patch monitor onto last page of Patient Log Book.   Place Patient Log Book in [place] box.  Use locking tab on box and tape box closed securely.  The [employer] and [employer] has [employer] on it.  Please place in mailbox as soon as possible.  Your physician should have your test results approximately 7 days after the monitor has been mailed back to  [facility].   Call [place] at [phone] if you have questions regarding your ZIO XT patch monitor.  Call them immediately if you see an orange light blinking on your monitor.   If your monitor falls off in less than 4 days contact our Monitor department at [phone].  If your monitor becomes loose or falls off after 4 days call [employer] at [phone] for suggestions on securing your monitor.    Follow-Up: At [facility], you and your health needs are our priority.  As part of our continuing mission to provide you with exceptional heart care, we have created designated Provider Care Teams.  These Care Teams include your primary Cardiologist (physician) and Advanced Practice Providers (APPs -  Physician Assistants and Nurse Practitioners) who all work together to provide you with the care you need, when you need it.  We recommend signing up for the patient portal called "MyChart".  Sign up information is provided on this After Visit Summary.  MyChart is used to connect with patients for Virtual Visits (Telemedicine).  Patients are able to view lab/test results, encounter notes, upcoming appointments, etc.  Non-urgent messages can be sent to your provider as well.   To learn more about what you can do with MyChart, go to [email or web address].    Your next appointment:   3 month(s)  The format for your next appointment:   In Person  Provider:   Oswaldo Milian, MD   Other Instructions Please check your blood pressure at home daily, write it down.  Call the office or send message via Mychart with the readings in 2 weeks for Dr. Gardiner Rhyme to review.    Weigh yourself daily, write this down.  Call if you gain 3 lbs overnight or 5 lbs in one week.     Signed, Donato Heinz, MD  09/23/2019 7:22 PM    [facility]

## 2019-09-22 ENCOUNTER — Other Ambulatory Visit: Payer: Self-pay

## 2019-09-22 ENCOUNTER — Ambulatory Visit (INDEPENDENT_AMBULATORY_CARE_PROVIDER_SITE_OTHER): Payer: Medicare PPO | Admitting: Cardiology

## 2019-09-22 ENCOUNTER — Telehealth: Payer: Self-pay | Admitting: Radiology

## 2019-09-22 ENCOUNTER — Encounter: Payer: Self-pay | Admitting: Cardiology

## 2019-09-22 VITALS — BP 153/81 | HR 87 | Ht 65.0 in | Wt 333.0 lb

## 2019-09-22 DIAGNOSIS — E785 Hyperlipidemia, unspecified: Secondary | ICD-10-CM | POA: Diagnosis not present

## 2019-09-22 DIAGNOSIS — R55 Syncope and collapse: Secondary | ICD-10-CM

## 2019-09-22 DIAGNOSIS — R0602 Shortness of breath: Secondary | ICD-10-CM

## 2019-09-22 DIAGNOSIS — I10 Essential (primary) hypertension: Secondary | ICD-10-CM | POA: Diagnosis not present

## 2019-09-22 MED ORDER — CARVEDILOL 6.25 MG PO TABS
6.2500 mg | ORAL_TABLET | Freq: Two times a day (BID) | ORAL | 3 refills | Status: DC
Start: 2019-09-22 — End: 2019-12-04

## 2019-09-22 NOTE — Patient Instructions (Addendum)
Medication Instructions:  INCREASE carvedilol (Coreg) 6.25 mg two times daily  Take Lasix daily for the next 3 days.  *If you need a refill on your cardiac medications before your next appointment, please call your pharmacy*   Lab Work: BMET today  If you have labs (blood work) drawn today and your tests are completely normal, you will receive your results only by: [facility] MyChart Message (if you have MyChart) OR . A paper copy in the mail If you have any lab test that is abnormal or we need to change your treatment, we will call you to review the results.   Testing/Procedures: Your physician has requested that you have a lexiscan myoview (2 day, [address]). For further information please visit [email or web address]. Please follow instruction sheet, as given.   ZIO XT- Long Term Monitor Instructions   Your physician has requested you wear your ZIO patch monitor 14 days.   This is a single patch monitor.  [employer] supplies one patch monitor per enrollment.  Additional stickers are not available.   Please do not apply patch if you will be having a Nuclear Stress Test, Echocardiogram, Cardiac CT, MRI, or Chest Xray during the time frame you would be wearing the monitor. The patch cannot be worn during these tests.  You cannot remove and re-apply the ZIO XT patch monitor.   Your ZIO patch monitor will be sent USPS Priority mail from [facility] directly to your home address. The monitor may also be mailed to a PO BOX if home delivery is not available.   It may take 3-5 days to receive your monitor after you have been enrolled.   Once you have received you monitor, please review enclosed instructions.  Your monitor has already been registered assigning a specific monitor serial # to you.   Applying the monitor   Shave hair from upper left chest.   Hold abrader disc by orange tab.  Rub abrader in 40 strokes over left upper chest as indicated in your monitor instructions.   Clean  area with 4 enclosed alcohol pads .  Use all pads to assure are is cleaned thoroughly.  Let dry.   Apply patch as indicated in monitor instructions.  Patch will be place under collarbone on left side of chest with arrow pointing upward.   Rub patch adhesive wings for 2 minutes.Remove white label marked "1".  Remove white label marked "2".  Rub patch adhesive wings for 2 additional minutes.   While looking in a mirror, press and release button in center of patch.  A small green light will flash 3-4 times .  This will be your only indicator the monitor has been turned on.     Do not shower for the first 24 hours.  You may shower after the first 24 hours.   Press button if you feel a symptom. You will hear a small click.  Record Date, Time and Symptom in the Patient Log Book.   When you are ready to remove patch, follow instructions on last 2 pages of Patient Log Book.  Stick patch monitor onto last page of Patient Log Book.   Place Patient Log Book in [place] box.  Use locking tab on box and tape box closed securely.  The [employer] has [employer] on it.  Please place in mailbox as soon as possible.  Your physician should have your test results approximately 7 days after the monitor has been mailed back to [facility].  Call [place] at [phone] if you have questions regarding your ZIO XT patch monitor.  Call them immediately if you see an orange light blinking on your monitor.   If your monitor falls off in less than 4 days contact our Monitor department at [phone].  If your monitor becomes loose or falls off after 4 days call [employer] at [phone] for suggestions on securing your monitor.    Follow-Up: At [facility], you and your health needs are our priority.  As part of our continuing mission to provide you with exceptional heart care, we have created designated Provider Care Teams.  These Care Teams include your primary Cardiologist  (physician) and Advanced Practice Providers (APPs -  Physician Assistants and Nurse Practitioners) who all work together to provide you with the care you need, when you need it.  We recommend signing up for the patient portal called "MyChart".  Sign up information is provided on this After Visit Summary.  MyChart is used to connect with patients for Virtual Visits (Telemedicine).  Patients are able to view lab/test results, encounter notes, upcoming appointments, etc.  Non-urgent messages can be sent to your provider as well.   To learn more about what you can do with MyChart, go to [email or web address].    Your next appointment:   3 month(s)  The format for your next appointment:   In Person  Provider:   Oswaldo Milian, MD   Other Instructions Please check your blood pressure at home daily, write it down.  Call the office or send message via Mychart with the readings in 2 weeks for Dr. Gardiner Rhyme to review.    Weigh yourself daily, write this down.  Call if you gain 3 lbs overnight or 5 lbs in one week.

## 2019-09-22 NOTE — Telephone Encounter (Signed)
Enrolled patient for a 14 day Zio AT monitor to be mailed to patients home.  

## 2019-09-24 ENCOUNTER — Other Ambulatory Visit (INDEPENDENT_AMBULATORY_CARE_PROVIDER_SITE_OTHER): Payer: Medicare PPO

## 2019-09-24 DIAGNOSIS — R55 Syncope and collapse: Secondary | ICD-10-CM | POA: Diagnosis not present

## 2019-09-25 DIAGNOSIS — R55 Syncope and collapse: Secondary | ICD-10-CM | POA: Diagnosis not present

## 2019-10-08 ENCOUNTER — Encounter (HOSPITAL_COMMUNITY): Payer: Self-pay | Admitting: Cardiology

## 2019-10-13 DIAGNOSIS — J449 Chronic obstructive pulmonary disease, unspecified: Secondary | ICD-10-CM | POA: Diagnosis not present

## 2019-10-13 DIAGNOSIS — G4733 Obstructive sleep apnea (adult) (pediatric): Secondary | ICD-10-CM | POA: Diagnosis not present

## 2019-10-19 ENCOUNTER — Telehealth: Payer: Self-pay | Admitting: Cardiology

## 2019-10-19 ENCOUNTER — Telehealth (HOSPITAL_COMMUNITY): Payer: Self-pay | Admitting: Cardiology

## 2019-10-19 ENCOUNTER — Encounter (HOSPITAL_COMMUNITY): Payer: Medicare PPO

## 2019-10-19 NOTE — Telephone Encounter (Signed)
Left message to call office

## 2019-10-19 NOTE — Telephone Encounter (Signed)
Just an FYI. We have made several attempts to contact this patient including sending a letter to schedule or reschedule their Myoview. We will be removing the patient from the echo/nuc WQ.   10/08/2019 MAILED LETTER [facility]  10/05/2019 [facility] @ 3:09/[facility] 09/29/19 [facility] to schedule @ 10:43/[facility] 09/3/21called and TEL# dsc'd/[facility] called HM# and hung up on me/@ 11:14/[facility] 09/22/19 [facility] to schedule pt was in office today/[facility] @ 1:48 09-23-2019  Called both numbers listed...no answer or vmail available 9:30 am  VB      Thank you

## 2019-10-19 NOTE — Telephone Encounter (Signed)
New message:       Patient calling to speak width nurse concerning some kind of results. Please call patient.

## 2019-10-20 NOTE — Telephone Encounter (Signed)
The patient called to schedule her stress test. She states her phone is broken and does not always ring. She requests the scheduler call to arrange and if she does not answer, please leave a message with a direct line and she will call back. She was grateful for assistance.

## 2019-11-12 DIAGNOSIS — J449 Chronic obstructive pulmonary disease, unspecified: Secondary | ICD-10-CM | POA: Diagnosis not present

## 2019-11-12 DIAGNOSIS — G4733 Obstructive sleep apnea (adult) (pediatric): Secondary | ICD-10-CM | POA: Diagnosis not present

## 2019-11-18 ENCOUNTER — Other Ambulatory Visit: Payer: Self-pay | Admitting: Registered Nurse

## 2019-11-18 DIAGNOSIS — J302 Other seasonal allergic rhinitis: Secondary | ICD-10-CM

## 2019-12-04 ENCOUNTER — Encounter: Payer: Self-pay | Admitting: Registered Nurse

## 2019-12-04 ENCOUNTER — Ambulatory Visit: Payer: Medicare PPO | Admitting: Registered Nurse

## 2019-12-04 ENCOUNTER — Other Ambulatory Visit: Payer: Self-pay

## 2019-12-04 ENCOUNTER — Telehealth: Payer: Self-pay | Admitting: Registered Nurse

## 2019-12-04 VITALS — BP 139/82 | HR 107 | Temp 97.4°F | Resp 18 | Ht 65.0 in | Wt 327.2 lb

## 2019-12-04 DIAGNOSIS — E2839 Other primary ovarian failure: Secondary | ICD-10-CM | POA: Diagnosis not present

## 2019-12-04 DIAGNOSIS — E1122 Type 2 diabetes mellitus with diabetic chronic kidney disease: Secondary | ICD-10-CM

## 2019-12-04 DIAGNOSIS — I10 Essential (primary) hypertension: Secondary | ICD-10-CM | POA: Diagnosis not present

## 2019-12-04 DIAGNOSIS — N1831 Chronic kidney disease, stage 3a: Secondary | ICD-10-CM

## 2019-12-04 MED ORDER — CARVEDILOL 6.25 MG PO TABS: 6.2500 mg | ORAL_TABLET | Freq: Two times a day (BID) | ORAL | 3 refills | Status: DC

## 2019-12-04 NOTE — Patient Instructions (Signed)
° ° ° °  If you have lab work done today you will be contacted with your lab results within the next 2 weeks.  If you have not heard from us then please contact us. The fastest way to get your results is to register for My Chart. ° ° °IF you received an x-ray today, you will receive an invoice from [facility]. Please contact [facility] at [phone] with questions or concerns regarding your invoice.  ° °IF you received labwork today, you will receive an invoice from [facility]. Please contact [facility] at [phone] with questions or concerns regarding your invoice.  ° °Our billing staff will not be able to assist you with questions regarding bills from these companies. ° °You will be contacted with the lab results as soon as they are available. The fastest way to get your results is to activate your My Chart account. Instructions are located on the last page of this paperwork. If you have not heard from us regarding the results in 2 weeks, please contact this office. °  ° ° ° °

## 2019-12-04 NOTE — Telephone Encounter (Signed)
Patient was seen My Orland Mustard today for and is needing her RX for BP  called in to her [place], [place].  [address] [place] [address]  Phone:  [phone] Fax:  [phone]  DEA #:  --  She already paid a young man 25.00 to pick up for her   He is going to stop back by

## 2019-12-05 LAB — CBC WITH DIFFERENTIAL
Basophils Absolute: 0.1 10*3/uL (ref 0.0–0.2)
Basos: 1 %
EOS (ABSOLUTE): 0.8 10*3/uL — ABNORMAL HIGH (ref 0.0–0.4)
Eos: 9 %
Hematocrit: 40.8 % (ref 34.0–46.6)
Hemoglobin: 12.7 g/dL (ref 11.1–15.9)
Immature Grans (Abs): 0 10*3/uL (ref 0.0–0.1)
Immature Granulocytes: 0 %
Lymphocytes Absolute: 1.7 10*3/uL (ref 0.7–3.1)
Lymphs: 19 %
MCH: 25.7 pg — ABNORMAL LOW (ref 26.6–33.0)
MCHC: 31.1 g/dL — ABNORMAL LOW (ref 31.5–35.7)
MCV: 83 fL (ref 79–97)
Monocytes Absolute: 0.4 10*3/uL (ref 0.1–0.9)
Monocytes: 4 %
Neutrophils Absolute: 5.6 10*3/uL (ref 1.4–7.0)
Neutrophils: 67 %
RBC: 4.94 x10E6/uL (ref 3.77–5.28)
RDW: 14.3 % (ref 11.7–15.4)
WBC: 8.5 10*3/uL (ref 3.4–10.8)

## 2019-12-05 LAB — BASIC METABOLIC PANEL
BUN/Creatinine Ratio: 16 (ref 12–28)
BUN: 24 mg/dL (ref 8–27)
CO2: 25 mmol/L (ref 20–29)
Calcium: 9.5 mg/dL (ref 8.7–10.3)
Chloride: 104 mmol/L (ref 96–106)
Creatinine, Ser: 1.47 mg/dL — ABNORMAL HIGH (ref 0.57–1.00)
GFR calc Af Amer: 40 mL/min/{1.73_m2} — ABNORMAL LOW (ref >59)
GFR calc non Af Amer: 35 mL/min/{1.73_m2} — ABNORMAL LOW (ref >59)
Glucose: 130 mg/dL — ABNORMAL HIGH (ref 65–99)
Potassium: 4.3 mmol/L (ref 3.5–5.2)
Sodium: 142 mmol/L (ref 134–144)

## 2019-12-05 LAB — VITAMIN D 25 HYDROXY (VIT D DEFICIENCY, FRACTURES): Vit D, 25-Hydroxy: 7 ng/mL — ABNORMAL LOW (ref 30.0–100.0)

## 2019-12-05 LAB — HEMOGLOBIN A1C
Est. average glucose Bld gHb Est-mCnc: 120 mg/dL
Hgb A1c MFr Bld: 5.8 % — ABNORMAL HIGH (ref 4.8–5.6)

## 2019-12-10 ENCOUNTER — Other Ambulatory Visit: Payer: Self-pay

## 2019-12-10 DIAGNOSIS — I10 Essential (primary) hypertension: Secondary | ICD-10-CM

## 2019-12-10 DIAGNOSIS — E1122 Type 2 diabetes mellitus with diabetic chronic kidney disease: Secondary | ICD-10-CM

## 2019-12-10 DIAGNOSIS — N183 Chronic kidney disease, stage 3 unspecified: Secondary | ICD-10-CM

## 2019-12-10 MED ORDER — ATORVASTATIN CALCIUM 20 MG PO TABS: 20.0000 mg | ORAL_TABLET | Freq: Every day | ORAL | 0 refills | Status: AC

## 2019-12-11 MED ORDER — CARVEDILOL 6.25 MG PO TABS: 6.2500 mg | ORAL_TABLET | Freq: Two times a day (BID) | ORAL | 3 refills | Status: AC

## 2019-12-11 MED ORDER — METFORMIN HCL 500 MG PO TABS: 500.0000 mg | ORAL_TABLET | Freq: Two times a day (BID) | ORAL | 0 refills | Status: AC

## 2019-12-14 ENCOUNTER — Other Ambulatory Visit: Payer: Self-pay | Admitting: Registered Nurse

## 2019-12-14 DIAGNOSIS — E559 Vitamin D deficiency, unspecified: Secondary | ICD-10-CM

## 2019-12-14 MED ORDER — VITAMIN D (ERGOCALCIFEROL) 1.25 MG (50000 UNIT) PO CAPS: 50000.0000 [IU] | ORAL_CAPSULE | ORAL | 0 refills | Status: AC

## 2019-12-21 ENCOUNTER — Telehealth: Payer: Self-pay | Admitting: *Deleted

## 2019-12-21 NOTE — Telephone Encounter (Signed)
Schedule AWV.  

## 2019-12-24 ENCOUNTER — Other Ambulatory Visit: Payer: Self-pay | Admitting: Registered Nurse

## 2019-12-24 DIAGNOSIS — J302 Other seasonal allergic rhinitis: Secondary | ICD-10-CM

## 2020-02-14 ENCOUNTER — Encounter: Payer: Self-pay | Admitting: Registered Nurse

## 2020-02-14 NOTE — Progress Notes (Signed)
Established Patient Office Visit  Subjective:  Patient ID: Christine Parrish, female    DOB: [birth year]  Age: 76 y.o. MRN: [id number]  CC:  Chief Complaint  Patient presents with  . Medication Refill    Patient states she would like a medication refill. Per patient would like to discuss hemmorrhoids    HPI Christine Parrish presents for htn  Hypertension: Patient Currently taking: caredilol 6.43m PO bid ac. Good effect. No AEs. Denies CV symptoms including: chest pain, shob, doe, headache, visual changes, fatigue, claudication, and dependent edema.   Previous readings and labs: BP Readings from Last 3 Encounters:  12/04/19 139/82  09/22/19 (!) 153/81  07/13/19 104/65   Lab Results  Component Value Date   CREATININE 1.47 (H) 12/04/2019   Hemorrhoids: Ongoing issue. Flare recently. Some blood when wiping. Some mild discomfort. No frank blood in stool. No melena. No constipation or hesitancy No change in diet or exercise Is sedentary.   Past Medical History:  Diagnosis Date  . Arthritis   . Asthma   . Cancer (HCC)    Cervical  . COPD (chronic obstructive pulmonary disease) ([place]   . DM [place]diabetes mellitus) ([facility]   . Hyperlipidemia   . Hypertension   . Hypoxemia 09/25/2013  . Oxygen deficiency   . Sleep apnea    No CPAP (broken)    Past Surgical History:  Procedure Laterality Date  . ABDOMINAL HYSTERECTOMY    . COLONOSCOPY  2000?   Dr.Johnson  . COLONOSCOPY WITH PROPOFOL N/A 03/31/2018   Procedure: COLONOSCOPY WITH PROPOFOL;  Surgeon: AYetta Flock MD;  Location: [facility];  Service: Gastroenterology;  Laterality: N/A;  . EYE SURGERY    . POLYPECTOMY  03/31/2018   Procedure: POLYPECTOMY;  Surgeon: AYetta Flock MD;  [facility];  Service: Gastroenterology;;  . TUBAL LIGATION      Family History  Problem Relation Age of Onset  . Emphysema Mother        never smoker  . CAD Mother 633 . Asthma Mother   . Colon polyps Mother   .  Diabetes Father   . CAD Brother 520 . Asthma Daughter   . Asthma Sister   . Colon cancer Cousin        542's . Esophageal cancer Neg Hx   . Rectal cancer Neg Hx   . Stomach cancer Neg Hx     Social History   Socioeconomic History  . Marital status: Widowed    Spouse name: Not on file  . Number of children: 7  . Years of education: 122 . Highest education level: Not on file  Occupational History  . Occupation: Retired  Tobacco Use  . Smoking status: Former Smoker    Packs/day: 1.50    Years: 35.00    Pack years: 52.50    Types: Cigarettes    Quit date: 01/23/2003    Years since quitting: 17.0  . Smokeless tobacco: Never Used  Vaping Use  . Vaping Use: Never used  Substance and Sexual Activity  . Alcohol use: Not Currently  . Drug use: No  . Sexual activity: Never  Other Topics Concern  . Not on file  Social History Narrative   Patient is single and her daughter lives with her.   Patient has six living children and one child is deceased.   Patient is retired.   Patient has a 10th grade education.   Patient is right-handed.   Patient drinks  three cans of soda daily.         Social Determinants of Health   Financial Resource Strain: Not on file  Food Insecurity: Not on file  Transportation Needs: Not on file  Physical Activity: Not on file  Stress: Not on file  Social Connections: Not on file  Intimate Partner Violence: Not on file    Outpatient Medications Prior to Visit  Medication Sig Dispense Refill  . acetaminophen (TYLENOL) 500 MG tablet Take 1,000 mg by mouth every 6 (six) hours as needed for moderate pain.    Marland Kitchen albuterol (PROVENTIL) (2.5 MG/3ML) 0.083% nebulizer solution Take 3 mLs (2.5 mg total) by nebulization every 6 (six) hours as needed for wheezing or shortness of breath. 150 mL 0  . aspirin 81 MG tablet Take 81 mg by mouth daily.    . blood glucose meter kit and supplies Per insurance preference. Use to check blood glucose once a day. Dx E  11.22 1 each 11  . diphenhydrAMINE (SOMINEX) 25 MG tablet Take 25-50 mg by mouth at bedtime as needed for sleep.    . fexofenadine (ALLEGRA) 180 MG tablet Take 1 tablet (180 mg total) by mouth daily. 30 tablet 3  . fexofenadine-pseudoephedrine (ALLEGRA-D 24) 180-240 MG 24 hr tablet Take 1 tablet by mouth daily.    Marland Kitchen atorvastatin (LIPITOR) 20 MG tablet Take 1 tablet by mouth once daily 90 tablet 0  . carvedilol (COREG) 6.25 MG tablet Take 1 tablet (6.25 mg total) by mouth 2 (two) times daily with a meal. 180 tablet 3  . EQ LORATADINE 10 MG tablet TAKE 1 TABLET BY MOUTH ONCE DAILY AS NEEDED ALLERGIES 30 tablet 0  . metFORMIN (GLUCOPHAGE) 500 MG tablet TAKE 1 TABLET BY MOUTH TWICE DAILY WITH MEALS 180 tablet 0  . benzonatate (TESSALON) 200 MG capsule Take 1 capsule (200 mg total) by mouth 2 (two) times daily as needed for cough. 20 capsule 0  . furosemide (LASIX) 20 MG tablet Take 1 tablet (20 mg total) by mouth as needed (if weight increases 3 lbs overnight or 5 lbs in 1 week). (Patient not taking: Reported on 12/04/2019) 30 tablet 3  . azithromycin (ZITHROMAX) 250 MG tablet Take 2 today then 1 po day 2-5 (Patient not taking: Reported on 12/04/2019) 6 tablet 0  . fluticasone (FLONASE) 50 MCG/ACT nasal spray Place 1 spray into both nostrils daily as needed for allergies or rhinitis. (Patient not taking: Reported on 12/04/2019) 16 g 3  . OXYGEN Inhale 2 L into the lungs as needed. At home (Patient not taking: Reported on 12/04/2019)    . Pramoxine-Camphor-Zinc Acetate (ANTI ITCH EX) Apply 1 application topically daily as needed (itching). (Patient not taking: Reported on 12/04/2019)     No facility-administered medications prior to visit.    Allergies  Allergen Reactions  . Lisinopril Cough  . Losartan Swelling    ROS Review of Systems Per hpi     Objective:    Physical Exam Vitals and nursing note reviewed.  Constitutional:      General: She is not in acute distress.    Appearance:  Normal appearance. She is normal weight. She is not ill-appearing, toxic-appearing or diaphoretic.  Cardiovascular:     Rate and Rhythm: Normal rate and regular rhythm.     Heart sounds: Normal heart sounds. No murmur heard. No friction rub. No gallop.   Pulmonary:     Effort: Pulmonary effort is normal. No respiratory distress.     Breath sounds:  Normal breath sounds. No stridor. No wheezing, rhonchi or rales.  Chest:     Chest wall: No tenderness.  Skin:    General: Skin is warm and dry.  Neurological:     General: No focal deficit present.     Mental Status: She is alert and oriented to person, place, and time. Mental status is at baseline.  Psychiatric:        Mood and Affect: Mood normal.        Behavior: Behavior normal.        Thought Content: Thought content normal.        Judgment: Judgment normal.     BP 139/82   Pulse (!) 107   Temp (!) 97.4 F (36.3 C) (Temporal)   Resp 18   Ht 5' 5"  (1.651 m)   Wt (!) 327 lb 3.2 oz (148.4 kg)   SpO2 92%   BMI 54.45 kg/m  Wt Readings from Last 3 Encounters:  12/04/19 (!) 327 lb 3.2 oz (148.4 kg)  09/22/19 (!) 333 lb (151 kg)  07/13/19 (!) 320 lb 6.4 oz (145.3 kg)     Health Maintenance Due  Topic Date Due  . OPHTHALMOLOGY EXAM  03/04/2015  . URINE MICROALBUMIN  11/26/2018  . COVID-19 Vaccine (3 - Booster for Pfizer series) 11/09/2019    There are no preventive care reminders to display for this patient.  Lab Results  Component Value Date   TSH 1.370 06/30/2019   Lab Results  Component Value Date   WBC 8.5 12/04/2019   HGB 12.7 12/04/2019   HCT 40.8 12/04/2019   MCV 83 12/04/2019   PLT 305 04/04/2017   Lab Results  Component Value Date   NA 142 12/04/2019   K 4.3 12/04/2019   CO2 25 12/04/2019   GLUCOSE 130 (H) 12/04/2019   BUN 24 12/04/2019   CREATININE 1.47 (H) 12/04/2019   BILITOT 0.4 06/30/2019   ALKPHOS 116 06/30/2019   AST 17 06/30/2019   ALT 11 06/30/2019   PROT 8.4 06/30/2019   ALBUMIN 4.4  06/30/2019   CALCIUM 9.5 12/04/2019   Lab Results  Component Value Date   CHOL 148 06/30/2019   Lab Results  Component Value Date   HDL 59 06/30/2019   Lab Results  Component Value Date   LDLCALC 69 06/30/2019   Lab Results  Component Value Date   TRIG 115 06/30/2019   Lab Results  Component Value Date   CHOLHDL 2.5 06/30/2019   Lab Results  Component Value Date   HGBA1C 5.8 (H) 12/04/2019      Assessment & Plan:   Problem List Items Addressed This Visit      Cardiovascular and Mediastinum   Essential hypertension     Endocrine   DM (diabetes mellitus) - Primary   Relevant Orders   Hemoglobin A1c (Completed)   CBC With Differential (Completed)   Basic Metabolic Panel (Completed)    Other Visit Diagnoses    Estrogen deficiency       Relevant Orders   Vitamin D, 25-hydroxy (Completed)      Meds ordered this encounter  Medications  . DISCONTD: carvedilol (COREG) 6.25 MG tablet    Sig: Take 1 tablet (6.25 mg total) by mouth 2 (two) times daily with a meal.    Dispense:  180 tablet    Refill:  3    Order Specific Question:   Supervising Provider    Answer:   Carlota Raspberry, JEFFREY R [[id number]]    Follow-up:  No follow-ups on file.   PLAN  Refill carvedilol  Labs collected. Will follow up with the patient as warranted.  Suggest OTCs, high fiber diet, and increased hydration for hemorrhoids  Patient encouraged to call clinic with any questions, comments, or concerns.  Maximiano Coss, NP

## 2020-07-29 ENCOUNTER — Ambulatory Visit: Payer: Self-pay | Admitting: Cardiology

## 2020-08-09 ENCOUNTER — Other Ambulatory Visit: Payer: Self-pay | Admitting: Registered Nurse

## 2020-08-09 DIAGNOSIS — E1122 Type 2 diabetes mellitus with diabetic chronic kidney disease: Secondary | ICD-10-CM

## 2020-08-09 DIAGNOSIS — N183 Chronic kidney disease, stage 3 unspecified: Secondary | ICD-10-CM

## 2020-11-21 ENCOUNTER — Other Ambulatory Visit: Payer: Self-pay

## 2020-11-21 DIAGNOSIS — I739 Peripheral vascular disease, unspecified: Secondary | ICD-10-CM

## 2020-11-24 NOTE — Progress Notes (Signed)
Office Note     CC: Bilateral lower extremity pain Requesting Provider:  Jordan Hawks, FNP  HPI: Christine Parrish is a 75 y.o. (Feb 13, 1945) female presenting at the request of .Jordan Hawks, FNP with bilateral lower extremity pain.  Christine Parrish has appreciated bilateral lower extremity pain for quite some time.  This first started with back pain and now radiates down both legs.  She uses a wheelchair the majority of the time due to this back pain but can stand for brief instances. She denies pain in her calves, rest pain in her feet.  She has no wounds on her feet that she can appreciate. Christine Parrish is working on weight loss, having started her diet recently.  She is aware that her weight is a major barrier and a likely culprit for her back and lower extremity pain.  Denies sensorimotor changes in her feet.   The pt is  on a statin for cholesterol management.  The pt is  on a daily aspirin.   Other AC:  - The pt is  on medication for hypertension.   The pt is not diabetic.  Tobacco hx:  -  Past Medical History:  Diagnosis Date   Arthritis    Asthma    Cancer ([place])    Cervical   COPD (chronic obstructive pulmonary disease) (HCC)    DM (diabetes mellitus) ([place])    Hyperlipidemia    Hypertension    Hypoxemia 09/25/2013   Oxygen deficiency    Sleep apnea    No CPAP (broken)    Past Surgical History:  Procedure Laterality Date   ABDOMINAL HYSTERECTOMY     COLONOSCOPY  2000?   Dr.Johnson   COLONOSCOPY WITH PROPOFOL N/A 03/31/2018   Procedure: COLONOSCOPY WITH PROPOFOL;  Surgeon: Yetta Flock, MD;  Location: [facility];  Service: Gastroenterology;  Laterality: N/A;   EYE SURGERY     POLYPECTOMY  03/31/2018   Procedure: POLYPECTOMY;  Surgeon: Yetta Flock, MD;  Location: [facility];  Service: Gastroenterology;;   TUBAL LIGATION      Social History   Socioeconomic History   Marital status: Widowed    Spouse name: Not on file   Number of children: 7   Years of  education: 10   Highest education level: Not on file  Occupational History   Occupation: Retired  Tobacco Use   Smoking status: Former    Packs/day: 1.50    Years: 35.00    Pack years: 52.50    Types: Cigarettes    Quit date: 01/23/2003    Years since quitting: 17.8   Smokeless tobacco: Never  Vaping Use   Vaping Use: Never used  Substance and Sexual Activity   Alcohol use: Not Currently   Drug use: No   Sexual activity: Never  Other Topics Concern   Not on file  Social History Narrative   Patient is single and her daughter lives with her.   Patient has six living children and one child is deceased.   Patient is retired.   Patient has a 10th grade education.   Patient is right-handed.   Patient drinks three cans of soda daily.         Social Determinants of Health   Financial Resource Strain: Not on file  Food Insecurity: Not on file  Transportation Needs: Not on file  Physical Activity: Not on file  Stress: Not on file  Social Connections: Not on file  Intimate Partner Violence: Not on file    Family  History  Problem Relation Age of Onset   Emphysema Mother        never smoker   CAD Mother 39   Asthma Mother    Colon polyps Mother    Diabetes Father    CAD Brother 59   Asthma Daughter    Asthma Sister    Colon cancer Cousin        50's   Esophageal cancer Neg Hx    Rectal cancer Neg Hx    Stomach cancer Neg Hx     Current Outpatient Medications  Medication Sig Dispense Refill   acetaminophen (TYLENOL) 500 MG tablet Take 1,000 mg by mouth every 6 (six) hours as needed for moderate pain.     albuterol (PROVENTIL) (2.5 MG/3ML) 0.083% nebulizer solution Take 3 mLs (2.5 mg total) by nebulization every 6 (six) hours as needed for wheezing or shortness of breath. 150 mL 0   aspirin 81 MG tablet Take 81 mg by mouth daily.     atorvastatin (LIPITOR) 20 MG tablet Take 1 tablet (20 mg total) by mouth daily. 90 tablet 0   benzonatate (TESSALON) 200 MG capsule Take  1 capsule (200 mg total) by mouth 2 (two) times daily as needed for cough. 20 capsule 0   blood glucose meter kit and supplies Per insurance preference. Use to check blood glucose once a day. Dx E 11.22 1 each 11   carvedilol (COREG) 6.25 MG tablet Take 1 tablet (6.25 mg total) by mouth 2 (two) times daily with a meal. 180 tablet 3   diphenhydrAMINE (SOMINEX) 25 MG tablet Take 25-50 mg by mouth at bedtime as needed for sleep.     EQ ALLERGY RELIEF 10 MG tablet TAKE 1 TABLET BY MOUTH ONCE DAILY AS NEEDED FOR ALLERGIES 30 tablet 0   fexofenadine (ALLEGRA) 180 MG tablet Take 1 tablet (180 mg total) by mouth daily. 30 tablet 3   fexofenadine-pseudoephedrine (ALLEGRA-D 24) 180-240 MG 24 hr tablet Take 1 tablet by mouth daily.     furosemide (LASIX) 20 MG tablet Take 1 tablet (20 mg total) by mouth as needed (if weight increases 3 lbs overnight or 5 lbs in 1 week). (Patient not taking: Reported on 12/04/2019) 30 tablet 3   metFORMIN (GLUCOPHAGE) 500 MG tablet Take 1 tablet (500 mg total) by mouth 2 (two) times daily with a meal. 180 tablet 0   Vitamin D, Ergocalciferol, (DRISDOL) 1.25 MG (50000 UNIT) CAPS capsule Take 1 capsule (50,000 Units total) by mouth every 7 (seven) days. 12 capsule 0   No current facility-administered medications for this visit.    Allergies  Allergen Reactions   Lisinopril Cough   Losartan Swelling     REVIEW OF SYSTEMS:   [X]  denotes positive finding, [ ]  denotes negative finding Cardiac  Comments:  Chest pain or chest pressure:    Shortness of breath upon exertion:    Short of breath when lying flat:    Irregular heart rhythm:        Vascular    Pain in calf, thigh, or hip brought on by ambulation:    Pain in feet at night that wakes you up from your sleep:     Blood clot in your veins:    Leg swelling:         Pulmonary    Oxygen at home:    Productive cough:     Wheezing:         Neurologic    Sudden weakness in arms  or legs:     Sudden numbness in  arms or legs:     Sudden onset of difficulty speaking or slurred speech:    Temporary loss of vision in one eye:     Problems with dizziness:         Gastrointestinal    Blood in stool:     Vomited blood:         Genitourinary    Burning when urinating:     Blood in urine:        Psychiatric    Major depression:         Hematologic    Bleeding problems:    Problems with blood clotting too easily:        Skin    Rashes or ulcers:        Constitutional    Fever or chills:      PHYSICAL EXAMINATION:  There were no vitals filed for this visit.  General:  WDWN in NAD; vital signs documented above Gait: Not observed HENT: WNL, normocephalic Pulmonary: normal non-labored breathing , without Rales, rhonchi,  wheezing Cardiac: regular HR,  Abdomen: soft, NT, no masses Skin: without rashes Vascular Exam/Pulses:  Right Left  Radial 2+ (normal) 2+ (normal)  Ulnar 2+ (normal) 2+ (normal)  Femoral    Popliteal    DP 2+ (normal) 2+ (normal)  PT 2+ (normal) 2+ (normal)   Extremities: without ischemic changes, without Gangrene , without cellulitis; without open wounds;  Musculoskeletal: no muscle wasting or atrophy  Neurologic: A&O X 3;  No focal weakness or paresthesias are detected Psychiatric:  The pt has Normal affect.   Non-Invasive Vascular Imaging:       ASSESSMENT/PLAN: EUSEBIA GRULKE is a 75 y.o. female presenting with back and bilateral lower extremity pain, that is localized at the knees.  Christine Parrish ABI was independently reviewed demonstrating normal waveforms bilaterally with normal toe pressures.  Vascular disease is not etiology of her pain. Christine Parrish is aware that weight loss will help her tremendously as she will be in less pain and become more ambulatory. I congratulated her on starting a diet and for starting a new chapter of her life. Christine Parrish can follow-up with me as needed.   Broadus John, MD [facility] [phone]

## 2020-11-25 ENCOUNTER — Ambulatory Visit (HOSPITAL_COMMUNITY)
Admission: RE | Admit: 2020-11-25 | Discharge: 2020-11-25 | Disposition: A | Discharge status: Home / Self Care | Payer: Medicare (Managed Care) | Source: Ambulatory Visit | Attending: Vascular Surgery | Admitting: Vascular Surgery

## 2020-11-25 ENCOUNTER — Encounter (HOSPITAL_COMMUNITY): Payer: Self-pay

## 2020-11-25 ENCOUNTER — Other Ambulatory Visit: Payer: Self-pay

## 2020-11-25 ENCOUNTER — Encounter: Payer: Self-pay | Admitting: Vascular Surgery

## 2020-11-25 ENCOUNTER — Ambulatory Visit: Payer: Medicare (Managed Care) | Admitting: Vascular Surgery

## 2020-11-25 VITALS — BP 133/89 | HR 81 | Temp 98.3°F | Resp 20 | Ht 65.0 in | Wt 327.0 lb

## 2020-11-25 DIAGNOSIS — M545 Low back pain, unspecified: Secondary | ICD-10-CM | POA: Diagnosis not present

## 2020-11-25 DIAGNOSIS — I739 Peripheral vascular disease, unspecified: Secondary | ICD-10-CM | POA: Diagnosis present

## 2020-11-25 DIAGNOSIS — G8929 Other chronic pain: Secondary | ICD-10-CM

## 2021-01-24 ENCOUNTER — Other Ambulatory Visit: Payer: Self-pay | Admitting: Registered Nurse

## 2021-01-24 DIAGNOSIS — E1122 Type 2 diabetes mellitus with diabetic chronic kidney disease: Secondary | ICD-10-CM

## 2021-01-24 DIAGNOSIS — N183 Chronic kidney disease, stage 3 unspecified: Secondary | ICD-10-CM

## 2021-03-23 ENCOUNTER — Emergency Department (HOSPITAL_BASED_OUTPATIENT_CLINIC_OR_DEPARTMENT_OTHER)
Admission: EM | Admit: 2021-03-23 | Discharge: 2021-03-23 | Disposition: A | Discharge status: Home / Self Care | Payer: Medicare PPO | Attending: Emergency Medicine | Admitting: Emergency Medicine

## 2021-03-23 ENCOUNTER — Other Ambulatory Visit: Payer: Self-pay

## 2021-03-23 ENCOUNTER — Emergency Department (HOSPITAL_BASED_OUTPATIENT_CLINIC_OR_DEPARTMENT_OTHER): Payer: Medicare PPO

## 2021-03-23 ENCOUNTER — Encounter (HOSPITAL_BASED_OUTPATIENT_CLINIC_OR_DEPARTMENT_OTHER): Payer: Self-pay | Admitting: Emergency Medicine

## 2021-03-23 DIAGNOSIS — Z20822 Contact with and (suspected) exposure to covid-19: Secondary | ICD-10-CM | POA: Diagnosis not present

## 2021-03-23 DIAGNOSIS — J45909 Unspecified asthma, uncomplicated: Secondary | ICD-10-CM | POA: Diagnosis not present

## 2021-03-23 DIAGNOSIS — I10 Essential (primary) hypertension: Secondary | ICD-10-CM | POA: Insufficient documentation

## 2021-03-23 DIAGNOSIS — J449 Chronic obstructive pulmonary disease, unspecified: Secondary | ICD-10-CM | POA: Diagnosis not present

## 2021-03-23 DIAGNOSIS — E119 Type 2 diabetes mellitus without complications: Secondary | ICD-10-CM | POA: Diagnosis not present

## 2021-03-23 DIAGNOSIS — Z87891 Personal history of nicotine dependence: Secondary | ICD-10-CM | POA: Insufficient documentation

## 2021-03-23 DIAGNOSIS — Z8541 Personal history of malignant neoplasm of cervix uteri: Secondary | ICD-10-CM | POA: Diagnosis not present

## 2021-03-23 DIAGNOSIS — I130 Hypertensive heart and chronic kidney disease with heart failure and stage 1 through stage 4 chronic kidney disease, or unspecified chronic kidney disease: Secondary | ICD-10-CM | POA: Diagnosis not present

## 2021-03-23 DIAGNOSIS — R0602 Shortness of breath: Secondary | ICD-10-CM | POA: Insufficient documentation

## 2021-03-23 LAB — CBC WITH DIFFERENTIAL/PLATELET
Abs Immature Granulocytes: 0.03 10*3/uL (ref 0.00–0.07)
Basophils Absolute: 0.1 10*3/uL (ref 0.0–0.1)
Basophils Relative: 1 %
Eosinophils Absolute: 0.4 10*3/uL (ref 0.0–0.5)
Eosinophils Relative: 4 %
HCT: 39.5 % (ref 36.0–46.0)
Hemoglobin: 12.2 g/dL (ref 12.0–15.0)
Immature Granulocytes: 0 %
Lymphocytes Relative: 22 %
Lymphs Abs: 2.2 10*3/uL (ref 0.7–4.0)
MCH: 27.5 pg (ref 26.0–34.0)
MCHC: 30.9 g/dL (ref 30.0–36.0)
MCV: 89 fL (ref 80.0–100.0)
Monocytes Absolute: 0.8 10*3/uL (ref 0.1–1.0)
Monocytes Relative: 8 %
Neutro Abs: 6.4 10*3/uL (ref 1.7–7.7)
Neutrophils Relative %: 65 %
Platelets: 256 10*3/uL (ref 150–400)
RBC: 4.44 MIL/uL (ref 3.87–5.11)
RDW: 14.7 % (ref 11.5–15.5)
WBC: 10 10*3/uL (ref 4.0–10.5)
nRBC: 0 % (ref 0.0–0.2)

## 2021-03-23 LAB — COMPREHENSIVE METABOLIC PANEL
ALT: 11 U/L (ref 0–44)
AST: 18 U/L (ref 15–41)
Albumin: 3.5 g/dL (ref 3.5–5.0)
Alkaline Phosphatase: 76 U/L (ref 38–126)
Anion gap: 10 (ref 5–15)
BUN: 27 mg/dL — ABNORMAL HIGH (ref 8–23)
CO2: 25 mmol/L (ref 22–32)
Calcium: 9 mg/dL (ref 8.9–10.3)
Chloride: 103 mmol/L (ref 98–111)
Creatinine, Ser: 1.4 mg/dL — ABNORMAL HIGH (ref 0.44–1.00)
GFR, Estimated: 39 mL/min — ABNORMAL LOW (ref >60)
Glucose, Bld: 103 mg/dL — ABNORMAL HIGH (ref 70–99)
Potassium: 4 mmol/L (ref 3.5–5.1)
Sodium: 138 mmol/L (ref 135–145)
Total Bilirubin: 0.7 mg/dL (ref 0.3–1.2)
Total Protein: 8.5 g/dL — ABNORMAL HIGH (ref 6.5–8.1)

## 2021-03-23 LAB — RESP PANEL BY RT-PCR (FLU A&B, COVID) ARPGX2
Influenza A by PCR: NEGATIVE
Influenza B by PCR: NEGATIVE
SARS Coronavirus 2 by RT PCR: NEGATIVE

## 2021-03-23 LAB — TROPONIN I (HIGH SENSITIVITY): Troponin I (High Sensitivity): 11 ng/L (ref <18)

## 2021-03-23 LAB — BRAIN NATRIURETIC PEPTIDE: B Natriuretic Peptide: 119.9 pg/mL — ABNORMAL HIGH (ref 0.0–100.0)

## 2021-03-23 NOTE — Discharge Instructions (Signed)
You were seen in the emergency room today with some trouble breathing.  Your x-ray, lab work, COVID test were all reassuring.  Please continue your home medications and use your home oxygen if you feel symptoms.  Please follow close with your primary care doctor. ?

## 2021-03-23 NOTE — ED Provider Notes (Signed)
? ?Emergency Department Provider Note ? ? ?I have reviewed the triage vital signs and the nursing notes. ? ? ?HISTORY ? ?Chief Complaint ?Shortness of Breath ? ? ?HPI ?Christine Parrish is a 76 y.o. female past medical history reviewed below including COPD, diabetes, OSA, and home O2 PRN presents to the emergency department from her PCP office with report of low oxygen levels there.  She was unsure of the exact number but was sent by her PCP for evaluation.  Patient states that she is felt some nasal congestion and more dyspnea with exertion over the past week.  She not having chest pain, pleuritic or otherwise.  At rest she is feeling very comfortable.  She is comfortable lying flat.  No productive cough or hemoptysis.  She had one episode of vomiting yesterday but no abdominal pain or diarrhea. ? ? ?Past Medical History:  ?Diagnosis Date  ? Arthritis   ? Asthma   ? Cancer [facility])   ? Cervical  ? COPD (chronic obstructive pulmonary disease) (HCC)   ? DM (diabetes mellitus) ([place])   ? Hyperlipidemia   ? Hypertension   ? Hypoxemia 09/25/2013  ? Oxygen deficiency   ? Sleep apnea   ? No CPAP (broken)  ? ? ?Review of Systems ? ?Constitutional: No fever/chills ?Eyes: No visual changes. ?ENT: No sore throat. ?Cardiovascular: Denies chest pain. ?Respiratory: Positive mild shortness of breath. ?Gastrointestinal: No abdominal pain.  Positive vomiting (yesterday).  No diarrhea.  No constipation. ?Genitourinary: Negative for dysuria. ?Musculoskeletal: Negative for back pain. ?Skin: Negative for rash. ?Neurological: Negative for headaches, focal weakness or numbness. ? ? ?____________________________________________ ? ? ?PHYSICAL EXAM: ? ?VITAL SIGNS: ?ED Triage Vitals  ?Enc Vitals Group  ?   BP 03/23/21 1621 (!) 168/89  ?   Pulse Rate 03/23/21 1607 81  ?   Resp 03/23/21 1607 20  ?   Temp 03/23/21 1607 98.4 ?F (36.9 ?C)  ?   Temp Source 03/23/21 1607 Oral  ?   SpO2 03/23/21 1607 95 %  ? ?Constitutional: Alert and oriented. Well  appearing and in no acute distress. ?Eyes: Conjunctivae are normal.  ?Head: Atraumatic. ?Nose: No congestion/rhinnorhea. ?Mouth/Throat: Mucous membranes are moist.   ?Neck: No stridor.   ?Cardiovascular: Normal rate, regular rhythm. Good peripheral circulation. Grossly normal heart sounds.   ?Respiratory: Normal respiratory effort.  No retractions. Lungs CTAB. No wheezing or rales.  ?Gastrointestinal: Soft with mild epigastric discomfort. No peritonitis. No distention.  ?Musculoskeletal: No lower extremity tenderness with trace pitting edema. No gross deformities of extremities. ?Neurologic:  Normal speech and language. No gross focal neurologic deficits are appreciated.  ?Skin:  Skin is warm, dry and intact. No rash noted. ? ? ?____________________________________________ ?  ?LABS ?(all labs ordered are listed, but only abnormal results are displayed) ? ?Labs Reviewed  ?COMPREHENSIVE METABOLIC PANEL - Abnormal; Notable for the following components:  ?    Result Value  ? Glucose, Bld 103 (*)   ? BUN 27 (*)   ? Creatinine, Ser 1.40 (*)   ? Total Protein 8.5 (*)   ? GFR, Estimated 39 (*)   ? All other components within normal limits  ?BRAIN NATRIURETIC PEPTIDE - Abnormal; Notable for the following components:  ? B Natriuretic Peptide 119.9 (*)   ? All other components within normal limits  ?RESP PANEL BY RT-PCR (FLU A&B, COVID) ARPGX2  ?CBC WITH DIFFERENTIAL/PLATELET  ?TROPONIN I (HIGH SENSITIVITY)  ? ?____________________________________________ ? ?EKG ? ? EKG Interpretation ? ?Date/Time:  Thursday March 23 2021 16:18:08 EST ?Ventricular Rate:  76 ?PR Interval:  159 ?QRS Duration: 97 ?QT Interval:  400 ?QTC Calculation: 450 ?R Axis:   93 ?Text Interpretation: Sinus rhythm Consider right ventricular hypertrophy Baseline wander in lead(s) V1 Confirmed by Nanda Quinton [phone]) on 03/23/2021 4:21:12 PM ?  ? ?  ? ? ?____________________________________________ ? ? ?PROCEDURES ? ?Procedure(s) performed:  ? ?Procedures ? ?None   ?____________________________________________ ? ? ?INITIAL IMPRESSION / ASSESSMENT AND PLAN / ED COURSE ? ?Pertinent labs & imaging results that were available during my care of the patient were reviewed by me and considered in my medical decision making (see chart for details). ?  ?This patient is Presenting for Evaluation of SOB, which does require a range of treatment options, and is a complaint that involves a high risk of morbidity and mortality. ? ?The Differential Diagnoses include COVID, Flu, CAP, pulmonary edema, ACS, PE. ? ? ?I decided to review pertinent External Data, and in summary PCP note from today not available in Epic or Care Everywhere. ?  ?Clinical Laboratory Tests Ordered, included COVID/Flu PCR is negative. BNP mildly elevated at 119. Troponin is WNL. No leukocytosis or anemia. Creatinine at 1.40.  ? ?Radiologic Tests Ordered, included CXR. I independently interpreted the images and agree with radiology interpretation.  ? ?Cardiac Monitor Tracing which shows NSR.  ? ? ?Social Determinants of Health Risk patient is a former smoker.  ? ?Medical Decision Making: Summary:  ?Patient presents to the emergency department with shortness of breath and reported hypoxemia at her PCP office.  She has COPD as well as other comorbidities likely contributing to her shortness of breath.  Here in the emergency department she is satting consistently in the low 90s on room air and appears very comfortable.  She is lying flat when I enter the exam room is very comfortable in that position.  She does not appear acutely volume overloaded.  Low suspicion for ACS or PE with lack of chest pain.  Plan for screening blood work and chest x-ray along with viral PCR testing.  She does have as needed oxygen at home. ? ?Reevaluation with update and discussion with patient and family. O2 is baseline for her. She has no PNA or clinical/lab evidence of ACS or CHF. Plan for continued home medications and PRN O2 which she has at  home. Discussed workup and plan at discharge along with ED return precautions.  ? ?Disposition: discharge.  ? ?____________________________________________ ? ?FINAL CLINICAL IMPRESSION(S) / ED DIAGNOSES ? ?Final diagnoses:  ?SOB (shortness of breath)  ? ? ?Note:  This document was prepared using Dragon voice recognition software and may include unintentional dictation errors. ? ?Nanda Quinton, MD, FACEP ?Emergency Medicine ? ?  ?Margette Fast, MD ?03/31/21 [phone] ? ?

## 2021-03-23 NOTE — ED Triage Notes (Signed)
Pt reports shortness of breath. Pt reports her doctor told her "it was too low." Has had asthma/allergies all week, but says shortness of breath did not feel like normal asthma. Then patient says it feels like it does this sometimes.  ?

## 2021-05-20 DIAGNOSIS — H9202 Otalgia, left ear: Secondary | ICD-10-CM | POA: Diagnosis not present

## 2021-05-20 DIAGNOSIS — J301 Allergic rhinitis due to pollen: Secondary | ICD-10-CM | POA: Diagnosis not present

## 2021-06-03 DIAGNOSIS — E119 Type 2 diabetes mellitus without complications: Secondary | ICD-10-CM | POA: Diagnosis not present

## 2021-06-23 DIAGNOSIS — Z0000 Encounter for general adult medical examination without abnormal findings: Secondary | ICD-10-CM | POA: Diagnosis not present

## 2021-07-04 DIAGNOSIS — E119 Type 2 diabetes mellitus without complications: Secondary | ICD-10-CM | POA: Diagnosis not present

## 2021-07-28 DIAGNOSIS — B359 Dermatophytosis, unspecified: Secondary | ICD-10-CM | POA: Diagnosis not present

## 2021-07-28 DIAGNOSIS — E1169 Type 2 diabetes mellitus with other specified complication: Secondary | ICD-10-CM | POA: Diagnosis not present

## 2021-07-28 DIAGNOSIS — E782 Mixed hyperlipidemia: Secondary | ICD-10-CM | POA: Diagnosis not present

## 2021-07-28 DIAGNOSIS — N189 Chronic kidney disease, unspecified: Secondary | ICD-10-CM | POA: Diagnosis not present

## 2021-07-28 DIAGNOSIS — I10 Essential (primary) hypertension: Secondary | ICD-10-CM | POA: Diagnosis not present

## 2021-07-28 DIAGNOSIS — I129 Hypertensive chronic kidney disease with stage 1 through stage 4 chronic kidney disease, or unspecified chronic kidney disease: Secondary | ICD-10-CM | POA: Diagnosis not present

## 2021-07-28 DIAGNOSIS — K5900 Constipation, unspecified: Secondary | ICD-10-CM | POA: Diagnosis not present

## 2021-08-03 DIAGNOSIS — E119 Type 2 diabetes mellitus without complications: Secondary | ICD-10-CM | POA: Diagnosis not present

## 2021-09-03 DIAGNOSIS — E119 Type 2 diabetes mellitus without complications: Secondary | ICD-10-CM | POA: Diagnosis not present

## 2021-09-11 DIAGNOSIS — I129 Hypertensive chronic kidney disease with stage 1 through stage 4 chronic kidney disease, or unspecified chronic kidney disease: Secondary | ICD-10-CM | POA: Diagnosis not present

## 2021-09-11 DIAGNOSIS — N189 Chronic kidney disease, unspecified: Secondary | ICD-10-CM | POA: Diagnosis not present

## 2021-09-11 DIAGNOSIS — I10 Essential (primary) hypertension: Secondary | ICD-10-CM | POA: Diagnosis not present

## 2021-10-04 DIAGNOSIS — E119 Type 2 diabetes mellitus without complications: Secondary | ICD-10-CM | POA: Diagnosis not present

## 2021-11-03 DIAGNOSIS — E119 Type 2 diabetes mellitus without complications: Secondary | ICD-10-CM | POA: Diagnosis not present

## 2021-11-10 DIAGNOSIS — Z20828 Contact with and (suspected) exposure to other viral communicable diseases: Secondary | ICD-10-CM | POA: Diagnosis not present

## 2021-11-27 IMAGING — DX DG CHEST 2V
2 series · 2 of 2 positions shown · non-contrast
Comparison: November 09, 2015

CLINICAL DATA: Worsening shortness of breath.

EXAM:
CHEST - 2 VIEW

[chest pa]
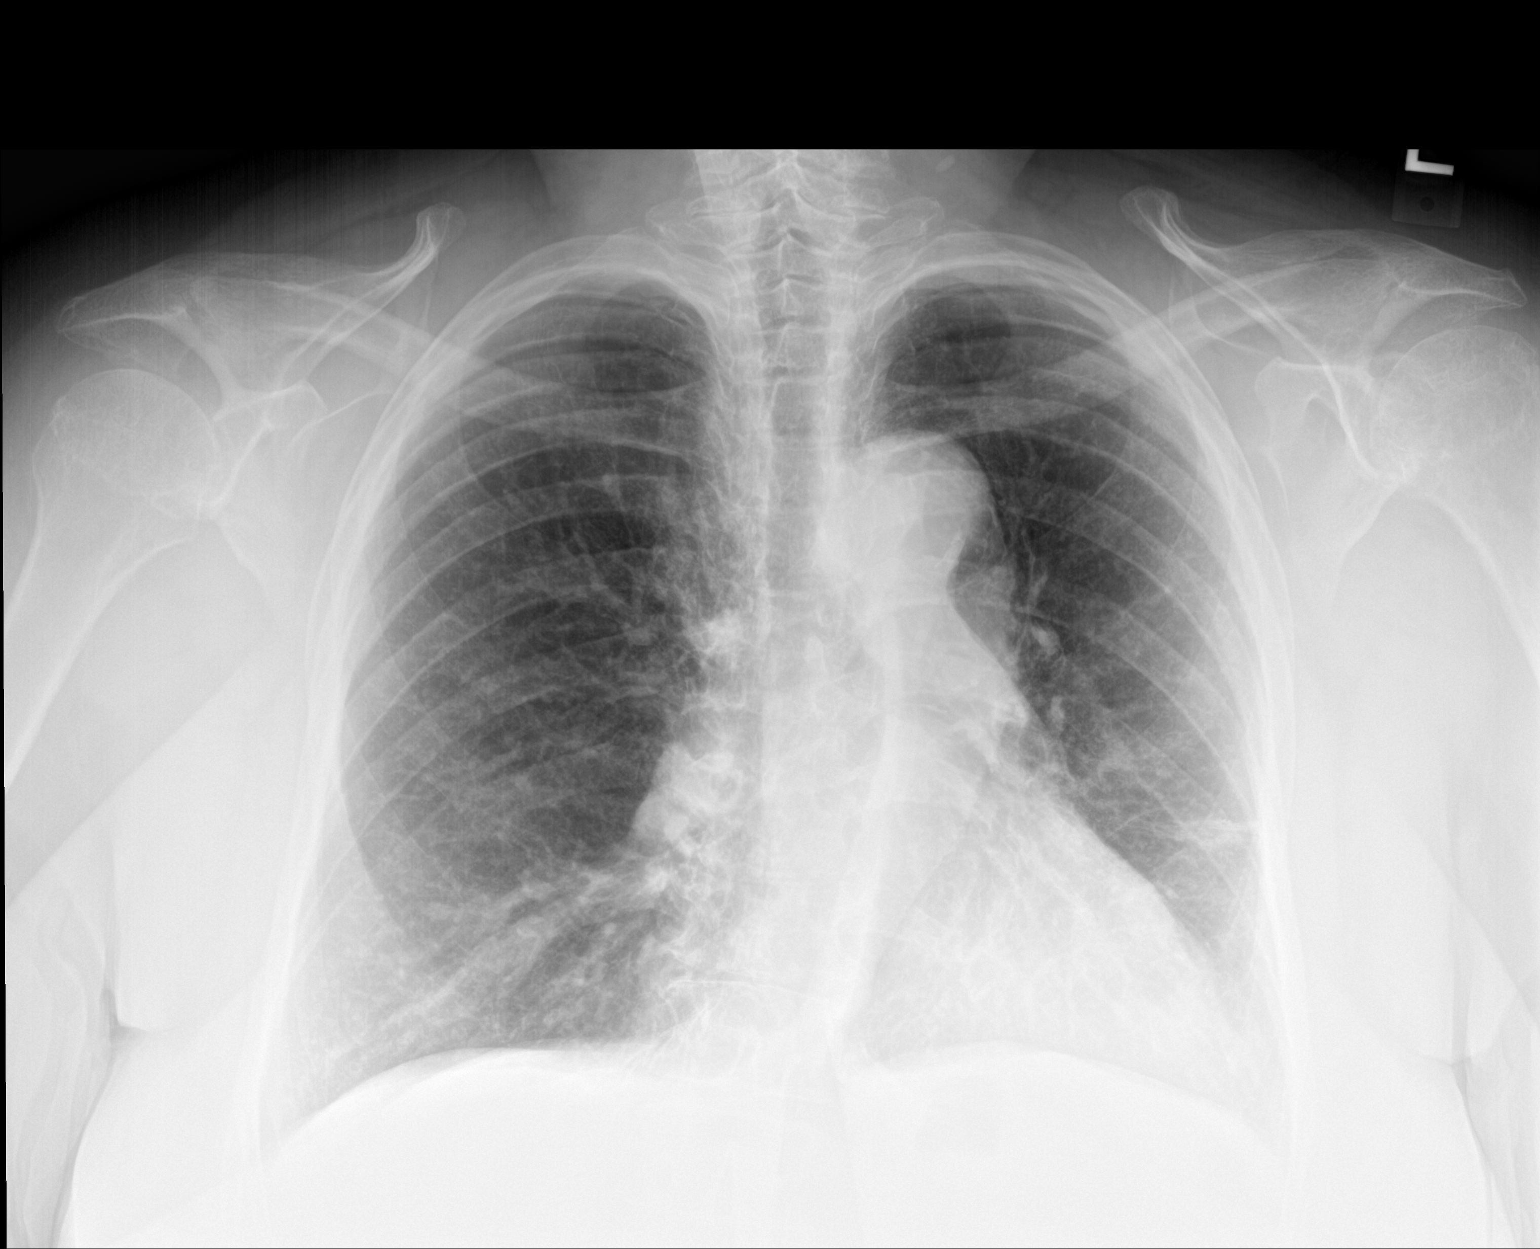

[chest lat]
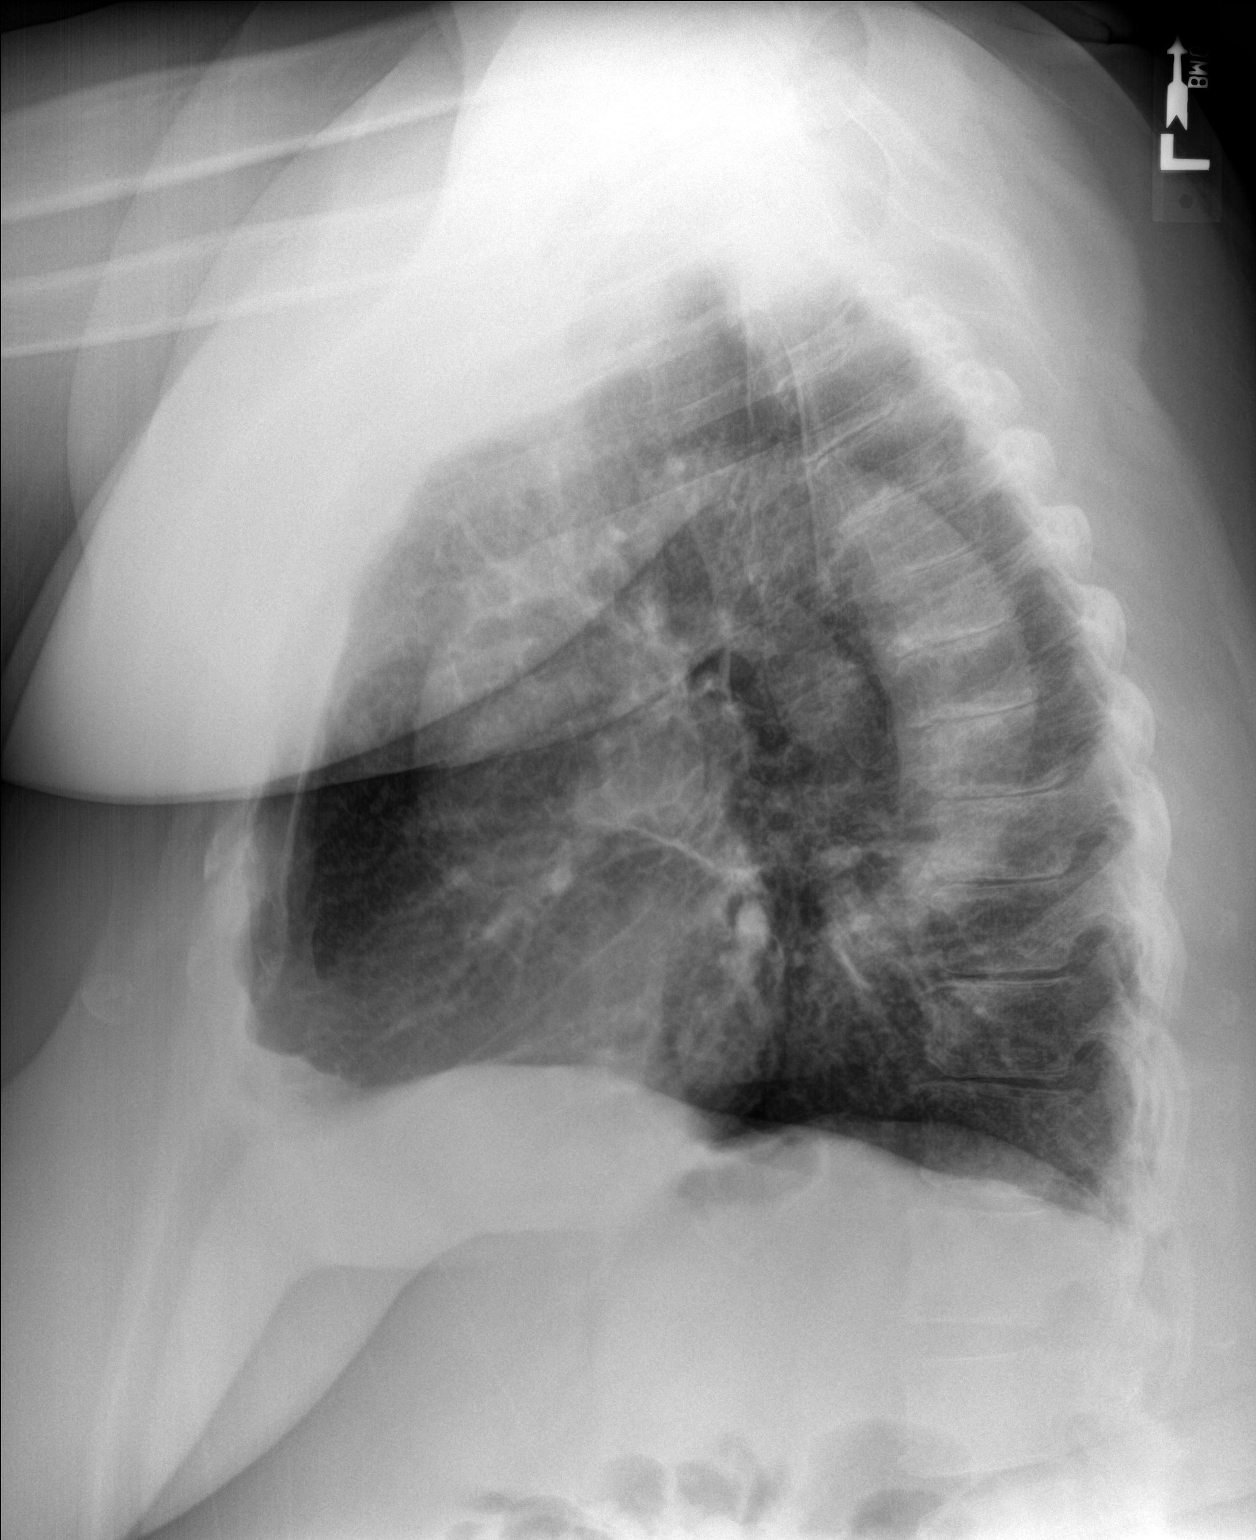

[2 of 2 positions shown; findings below may reference images not displayed]

FINDINGS: Mild, diffuse chronic appearing increased lung markings are seen.
Mild, stable linear scarring and/or atelectasis is seen within the
lateral aspect of the left lung base. There is no evidence of a
pleural effusion or pneumothorax. The heart size and mediastinal
contours are within normal limits. There is tortuosity of the
descending thoracic aorta. The visualized skeletal structures are
unremarkable.
IMPRESSION: Chronic appearing increased lung markings without evidence of acute
or active cardiopulmonary disease.

## 2021-12-04 DIAGNOSIS — E119 Type 2 diabetes mellitus without complications: Secondary | ICD-10-CM | POA: Diagnosis not present

## 2022-01-03 DIAGNOSIS — E119 Type 2 diabetes mellitus without complications: Secondary | ICD-10-CM | POA: Diagnosis not present

## 2022-02-03 DIAGNOSIS — E119 Type 2 diabetes mellitus without complications: Secondary | ICD-10-CM | POA: Diagnosis not present

## 2022-02-11 DIAGNOSIS — J449 Chronic obstructive pulmonary disease, unspecified: Secondary | ICD-10-CM | POA: Diagnosis not present

## 2022-02-11 DIAGNOSIS — G4733 Obstructive sleep apnea (adult) (pediatric): Secondary | ICD-10-CM | POA: Diagnosis not present

## 2022-03-13 DIAGNOSIS — Z6841 Body Mass Index (BMI) 40.0 and over, adult: Secondary | ICD-10-CM | POA: Diagnosis not present

## 2022-03-13 DIAGNOSIS — N189 Chronic kidney disease, unspecified: Secondary | ICD-10-CM | POA: Diagnosis not present

## 2022-03-13 DIAGNOSIS — I10 Essential (primary) hypertension: Secondary | ICD-10-CM | POA: Diagnosis not present

## 2022-03-13 DIAGNOSIS — J069 Acute upper respiratory infection, unspecified: Secondary | ICD-10-CM | POA: Diagnosis not present

## 2022-03-13 DIAGNOSIS — R7303 Prediabetes: Secondary | ICD-10-CM | POA: Diagnosis not present

## 2022-03-13 DIAGNOSIS — M79662 Pain in left lower leg: Secondary | ICD-10-CM | POA: Diagnosis not present

## 2022-03-13 DIAGNOSIS — I130 Hypertensive heart and chronic kidney disease with heart failure and stage 1 through stage 4 chronic kidney disease, or unspecified chronic kidney disease: Secondary | ICD-10-CM | POA: Diagnosis not present

## 2022-03-14 DIAGNOSIS — J449 Chronic obstructive pulmonary disease, unspecified: Secondary | ICD-10-CM | POA: Diagnosis not present

## 2022-03-14 DIAGNOSIS — G4733 Obstructive sleep apnea (adult) (pediatric): Secondary | ICD-10-CM | POA: Diagnosis not present

## 2022-04-06 DIAGNOSIS — E119 Type 2 diabetes mellitus without complications: Secondary | ICD-10-CM | POA: Diagnosis not present

## 2022-04-12 DIAGNOSIS — G4733 Obstructive sleep apnea (adult) (pediatric): Secondary | ICD-10-CM | POA: Diagnosis not present

## 2022-04-12 DIAGNOSIS — J449 Chronic obstructive pulmonary disease, unspecified: Secondary | ICD-10-CM | POA: Diagnosis not present

## 2022-05-06 DIAGNOSIS — E119 Type 2 diabetes mellitus without complications: Secondary | ICD-10-CM | POA: Diagnosis not present

## 2022-05-13 DIAGNOSIS — G4733 Obstructive sleep apnea (adult) (pediatric): Secondary | ICD-10-CM | POA: Diagnosis not present

## 2022-05-13 DIAGNOSIS — J449 Chronic obstructive pulmonary disease, unspecified: Secondary | ICD-10-CM | POA: Diagnosis not present

## 2022-05-16 DIAGNOSIS — E785 Hyperlipidemia, unspecified: Secondary | ICD-10-CM | POA: Diagnosis not present

## 2022-05-16 DIAGNOSIS — K08109 Complete loss of teeth, unspecified cause, unspecified class: Secondary | ICD-10-CM | POA: Diagnosis not present

## 2022-05-16 DIAGNOSIS — I129 Hypertensive chronic kidney disease with stage 1 through stage 4 chronic kidney disease, or unspecified chronic kidney disease: Secondary | ICD-10-CM | POA: Diagnosis not present

## 2022-05-16 DIAGNOSIS — K5900 Constipation, unspecified: Secondary | ICD-10-CM | POA: Diagnosis not present

## 2022-05-16 DIAGNOSIS — M1990 Unspecified osteoarthritis, unspecified site: Secondary | ICD-10-CM | POA: Diagnosis not present

## 2022-05-16 DIAGNOSIS — G4733 Obstructive sleep apnea (adult) (pediatric): Secondary | ICD-10-CM | POA: Diagnosis not present

## 2022-05-16 DIAGNOSIS — E559 Vitamin D deficiency, unspecified: Secondary | ICD-10-CM | POA: Diagnosis not present

## 2022-05-16 DIAGNOSIS — R32 Unspecified urinary incontinence: Secondary | ICD-10-CM | POA: Diagnosis not present

## 2022-06-05 DIAGNOSIS — E119 Type 2 diabetes mellitus without complications: Secondary | ICD-10-CM | POA: Diagnosis not present

## 2022-06-12 DIAGNOSIS — G4733 Obstructive sleep apnea (adult) (pediatric): Secondary | ICD-10-CM | POA: Diagnosis not present

## 2022-06-12 DIAGNOSIS — J449 Chronic obstructive pulmonary disease, unspecified: Secondary | ICD-10-CM | POA: Diagnosis not present

## 2022-06-26 DIAGNOSIS — R634 Abnormal weight loss: Secondary | ICD-10-CM | POA: Diagnosis not present

## 2022-06-26 DIAGNOSIS — N189 Chronic kidney disease, unspecified: Secondary | ICD-10-CM | POA: Diagnosis not present

## 2022-06-26 DIAGNOSIS — I10 Essential (primary) hypertension: Secondary | ICD-10-CM | POA: Diagnosis not present

## 2022-06-26 DIAGNOSIS — E1169 Type 2 diabetes mellitus with other specified complication: Secondary | ICD-10-CM | POA: Diagnosis not present

## 2022-07-06 DIAGNOSIS — E119 Type 2 diabetes mellitus without complications: Secondary | ICD-10-CM | POA: Diagnosis not present

## 2022-07-13 DIAGNOSIS — J449 Chronic obstructive pulmonary disease, unspecified: Secondary | ICD-10-CM | POA: Diagnosis not present

## 2022-07-13 DIAGNOSIS — G4733 Obstructive sleep apnea (adult) (pediatric): Secondary | ICD-10-CM | POA: Diagnosis not present

## 2022-08-05 DIAGNOSIS — E119 Type 2 diabetes mellitus without complications: Secondary | ICD-10-CM | POA: Diagnosis not present

## 2022-08-12 DIAGNOSIS — J449 Chronic obstructive pulmonary disease, unspecified: Secondary | ICD-10-CM | POA: Diagnosis not present

## 2022-08-12 DIAGNOSIS — G4733 Obstructive sleep apnea (adult) (pediatric): Secondary | ICD-10-CM | POA: Diagnosis not present

## 2022-09-05 DIAGNOSIS — E119 Type 2 diabetes mellitus without complications: Secondary | ICD-10-CM | POA: Diagnosis not present

## 2022-09-12 DIAGNOSIS — J449 Chronic obstructive pulmonary disease, unspecified: Secondary | ICD-10-CM | POA: Diagnosis not present

## 2022-09-12 DIAGNOSIS — G4733 Obstructive sleep apnea (adult) (pediatric): Secondary | ICD-10-CM | POA: Diagnosis not present

## 2022-09-14 ENCOUNTER — Other Ambulatory Visit: Payer: Self-pay | Admitting: Family Medicine

## 2022-09-14 ENCOUNTER — Ambulatory Visit
Admission: RE | Admit: 2022-09-14 | Discharge: 2022-09-14 | Disposition: A | Discharge status: Home / Self Care | Payer: Medicare PPO | Source: Ambulatory Visit | Attending: Family Medicine | Admitting: Family Medicine

## 2022-09-14 DIAGNOSIS — R053 Chronic cough: Secondary | ICD-10-CM

## 2022-09-14 DIAGNOSIS — J09X9 Influenza due to identified novel influenza A virus with other manifestations: Secondary | ICD-10-CM | POA: Diagnosis not present

## 2022-09-14 DIAGNOSIS — E1169 Type 2 diabetes mellitus with other specified complication: Secondary | ICD-10-CM | POA: Diagnosis not present

## 2022-09-14 DIAGNOSIS — I10 Essential (primary) hypertension: Secondary | ICD-10-CM | POA: Diagnosis not present

## 2022-09-14 DIAGNOSIS — R0602 Shortness of breath: Secondary | ICD-10-CM | POA: Diagnosis not present

## 2022-09-14 DIAGNOSIS — R634 Abnormal weight loss: Secondary | ICD-10-CM | POA: Diagnosis not present

## 2022-09-14 DIAGNOSIS — R059 Cough, unspecified: Secondary | ICD-10-CM | POA: Diagnosis not present

## 2022-10-06 DIAGNOSIS — E119 Type 2 diabetes mellitus without complications: Secondary | ICD-10-CM | POA: Diagnosis not present

## 2022-10-13 DIAGNOSIS — J449 Chronic obstructive pulmonary disease, unspecified: Secondary | ICD-10-CM | POA: Diagnosis not present

## 2022-10-13 DIAGNOSIS — G4733 Obstructive sleep apnea (adult) (pediatric): Secondary | ICD-10-CM | POA: Diagnosis not present

## 2022-11-05 DIAGNOSIS — E119 Type 2 diabetes mellitus without complications: Secondary | ICD-10-CM | POA: Diagnosis not present

## 2022-11-12 DIAGNOSIS — J449 Chronic obstructive pulmonary disease, unspecified: Secondary | ICD-10-CM | POA: Diagnosis not present

## 2022-11-12 DIAGNOSIS — G4733 Obstructive sleep apnea (adult) (pediatric): Secondary | ICD-10-CM | POA: Diagnosis not present

## 2022-11-16 DIAGNOSIS — E1169 Type 2 diabetes mellitus with other specified complication: Secondary | ICD-10-CM | POA: Diagnosis not present

## 2022-11-16 DIAGNOSIS — R634 Abnormal weight loss: Secondary | ICD-10-CM | POA: Diagnosis not present

## 2022-11-16 DIAGNOSIS — E1122 Type 2 diabetes mellitus with diabetic chronic kidney disease: Secondary | ICD-10-CM | POA: Diagnosis not present

## 2022-11-16 DIAGNOSIS — I129 Hypertensive chronic kidney disease with stage 1 through stage 4 chronic kidney disease, or unspecified chronic kidney disease: Secondary | ICD-10-CM | POA: Diagnosis not present

## 2022-11-16 DIAGNOSIS — N189 Chronic kidney disease, unspecified: Secondary | ICD-10-CM | POA: Diagnosis not present

## 2022-12-06 DIAGNOSIS — E119 Type 2 diabetes mellitus without complications: Secondary | ICD-10-CM | POA: Diagnosis not present

## 2022-12-13 DIAGNOSIS — J449 Chronic obstructive pulmonary disease, unspecified: Secondary | ICD-10-CM | POA: Diagnosis not present

## 2022-12-13 DIAGNOSIS — G4733 Obstructive sleep apnea (adult) (pediatric): Secondary | ICD-10-CM | POA: Diagnosis not present

## 2022-12-28 DIAGNOSIS — I10 Essential (primary) hypertension: Secondary | ICD-10-CM | POA: Diagnosis not present

## 2022-12-28 DIAGNOSIS — E782 Mixed hyperlipidemia: Secondary | ICD-10-CM | POA: Diagnosis not present

## 2022-12-28 DIAGNOSIS — E7800 Pure hypercholesterolemia, unspecified: Secondary | ICD-10-CM | POA: Diagnosis not present

## 2022-12-28 DIAGNOSIS — I509 Heart failure, unspecified: Secondary | ICD-10-CM | POA: Diagnosis not present

## 2022-12-28 DIAGNOSIS — I471 Supraventricular tachycardia, unspecified: Secondary | ICD-10-CM | POA: Diagnosis not present

## 2022-12-28 DIAGNOSIS — Z6841 Body Mass Index (BMI) 40.0 and over, adult: Secondary | ICD-10-CM | POA: Diagnosis not present

## 2022-12-28 DIAGNOSIS — E1169 Type 2 diabetes mellitus with other specified complication: Secondary | ICD-10-CM | POA: Diagnosis not present

## 2023-01-05 DIAGNOSIS — E119 Type 2 diabetes mellitus without complications: Secondary | ICD-10-CM | POA: Diagnosis not present

## 2023-01-12 DIAGNOSIS — J449 Chronic obstructive pulmonary disease, unspecified: Secondary | ICD-10-CM | POA: Diagnosis not present

## 2023-01-12 DIAGNOSIS — G4733 Obstructive sleep apnea (adult) (pediatric): Secondary | ICD-10-CM | POA: Diagnosis not present

## 2023-02-12 DIAGNOSIS — G4733 Obstructive sleep apnea (adult) (pediatric): Secondary | ICD-10-CM | POA: Diagnosis not present

## 2023-02-12 DIAGNOSIS — J449 Chronic obstructive pulmonary disease, unspecified: Secondary | ICD-10-CM | POA: Diagnosis not present

## 2023-02-22 DIAGNOSIS — I129 Hypertensive chronic kidney disease with stage 1 through stage 4 chronic kidney disease, or unspecified chronic kidney disease: Secondary | ICD-10-CM | POA: Diagnosis not present

## 2023-02-22 DIAGNOSIS — E1169 Type 2 diabetes mellitus with other specified complication: Secondary | ICD-10-CM | POA: Diagnosis not present

## 2023-02-22 DIAGNOSIS — N189 Chronic kidney disease, unspecified: Secondary | ICD-10-CM | POA: Diagnosis not present

## 2023-02-22 DIAGNOSIS — Z0000 Encounter for general adult medical examination without abnormal findings: Secondary | ICD-10-CM | POA: Diagnosis not present

## 2023-03-15 DIAGNOSIS — J449 Chronic obstructive pulmonary disease, unspecified: Secondary | ICD-10-CM | POA: Diagnosis not present

## 2023-03-15 DIAGNOSIS — G4733 Obstructive sleep apnea (adult) (pediatric): Secondary | ICD-10-CM | POA: Diagnosis not present

## 2023-04-12 DIAGNOSIS — J449 Chronic obstructive pulmonary disease, unspecified: Secondary | ICD-10-CM | POA: Diagnosis not present

## 2023-04-12 DIAGNOSIS — G4733 Obstructive sleep apnea (adult) (pediatric): Secondary | ICD-10-CM | POA: Diagnosis not present

## 2023-04-26 DIAGNOSIS — N189 Chronic kidney disease, unspecified: Secondary | ICD-10-CM | POA: Diagnosis not present

## 2023-04-26 DIAGNOSIS — Z6841 Body Mass Index (BMI) 40.0 and over, adult: Secondary | ICD-10-CM | POA: Diagnosis not present

## 2023-04-26 DIAGNOSIS — R7303 Prediabetes: Secondary | ICD-10-CM | POA: Diagnosis not present

## 2023-04-26 DIAGNOSIS — E782 Mixed hyperlipidemia: Secondary | ICD-10-CM | POA: Diagnosis not present

## 2023-04-26 DIAGNOSIS — F32A Depression, unspecified: Secondary | ICD-10-CM | POA: Diagnosis not present

## 2023-04-26 DIAGNOSIS — I129 Hypertensive chronic kidney disease with stage 1 through stage 4 chronic kidney disease, or unspecified chronic kidney disease: Secondary | ICD-10-CM | POA: Diagnosis not present

## 2023-04-26 DIAGNOSIS — I10 Essential (primary) hypertension: Secondary | ICD-10-CM | POA: Diagnosis not present

## 2023-05-13 DIAGNOSIS — J449 Chronic obstructive pulmonary disease, unspecified: Secondary | ICD-10-CM | POA: Diagnosis not present

## 2023-05-13 DIAGNOSIS — G4733 Obstructive sleep apnea (adult) (pediatric): Secondary | ICD-10-CM | POA: Diagnosis not present

## 2023-06-12 DIAGNOSIS — J449 Chronic obstructive pulmonary disease, unspecified: Secondary | ICD-10-CM | POA: Diagnosis not present

## 2023-06-12 DIAGNOSIS — G4733 Obstructive sleep apnea (adult) (pediatric): Secondary | ICD-10-CM | POA: Diagnosis not present

## 2023-07-11 DIAGNOSIS — E1122 Type 2 diabetes mellitus with diabetic chronic kidney disease: Secondary | ICD-10-CM | POA: Diagnosis not present

## 2023-07-11 DIAGNOSIS — E119 Type 2 diabetes mellitus without complications: Secondary | ICD-10-CM | POA: Diagnosis not present

## 2023-07-11 DIAGNOSIS — N189 Chronic kidney disease, unspecified: Secondary | ICD-10-CM | POA: Diagnosis not present

## 2023-07-11 DIAGNOSIS — I129 Hypertensive chronic kidney disease with stage 1 through stage 4 chronic kidney disease, or unspecified chronic kidney disease: Secondary | ICD-10-CM | POA: Diagnosis not present

## 2023-07-11 DIAGNOSIS — I10 Essential (primary) hypertension: Secondary | ICD-10-CM | POA: Diagnosis not present

## 2023-07-11 DIAGNOSIS — F419 Anxiety disorder, unspecified: Secondary | ICD-10-CM | POA: Diagnosis not present

## 2023-07-11 DIAGNOSIS — I130 Hypertensive heart and chronic kidney disease with heart failure and stage 1 through stage 4 chronic kidney disease, or unspecified chronic kidney disease: Secondary | ICD-10-CM | POA: Diagnosis not present

## 2023-07-13 DIAGNOSIS — G4733 Obstructive sleep apnea (adult) (pediatric): Secondary | ICD-10-CM | POA: Diagnosis not present

## 2023-07-13 DIAGNOSIS — J449 Chronic obstructive pulmonary disease, unspecified: Secondary | ICD-10-CM | POA: Diagnosis not present

## 2023-08-12 DIAGNOSIS — J449 Chronic obstructive pulmonary disease, unspecified: Secondary | ICD-10-CM | POA: Diagnosis not present

## 2023-08-12 DIAGNOSIS — G4733 Obstructive sleep apnea (adult) (pediatric): Secondary | ICD-10-CM | POA: Diagnosis not present

## 2023-08-21 IMAGING — DX DG CHEST 2V
2 series · 2 of 2 positions shown · non-contrast
Comparison: Chest x-ray dated June 30, 2019

CLINICAL DATA: Shortness of breath

EXAM:
CHEST - 2 VIEW

[chest pa]
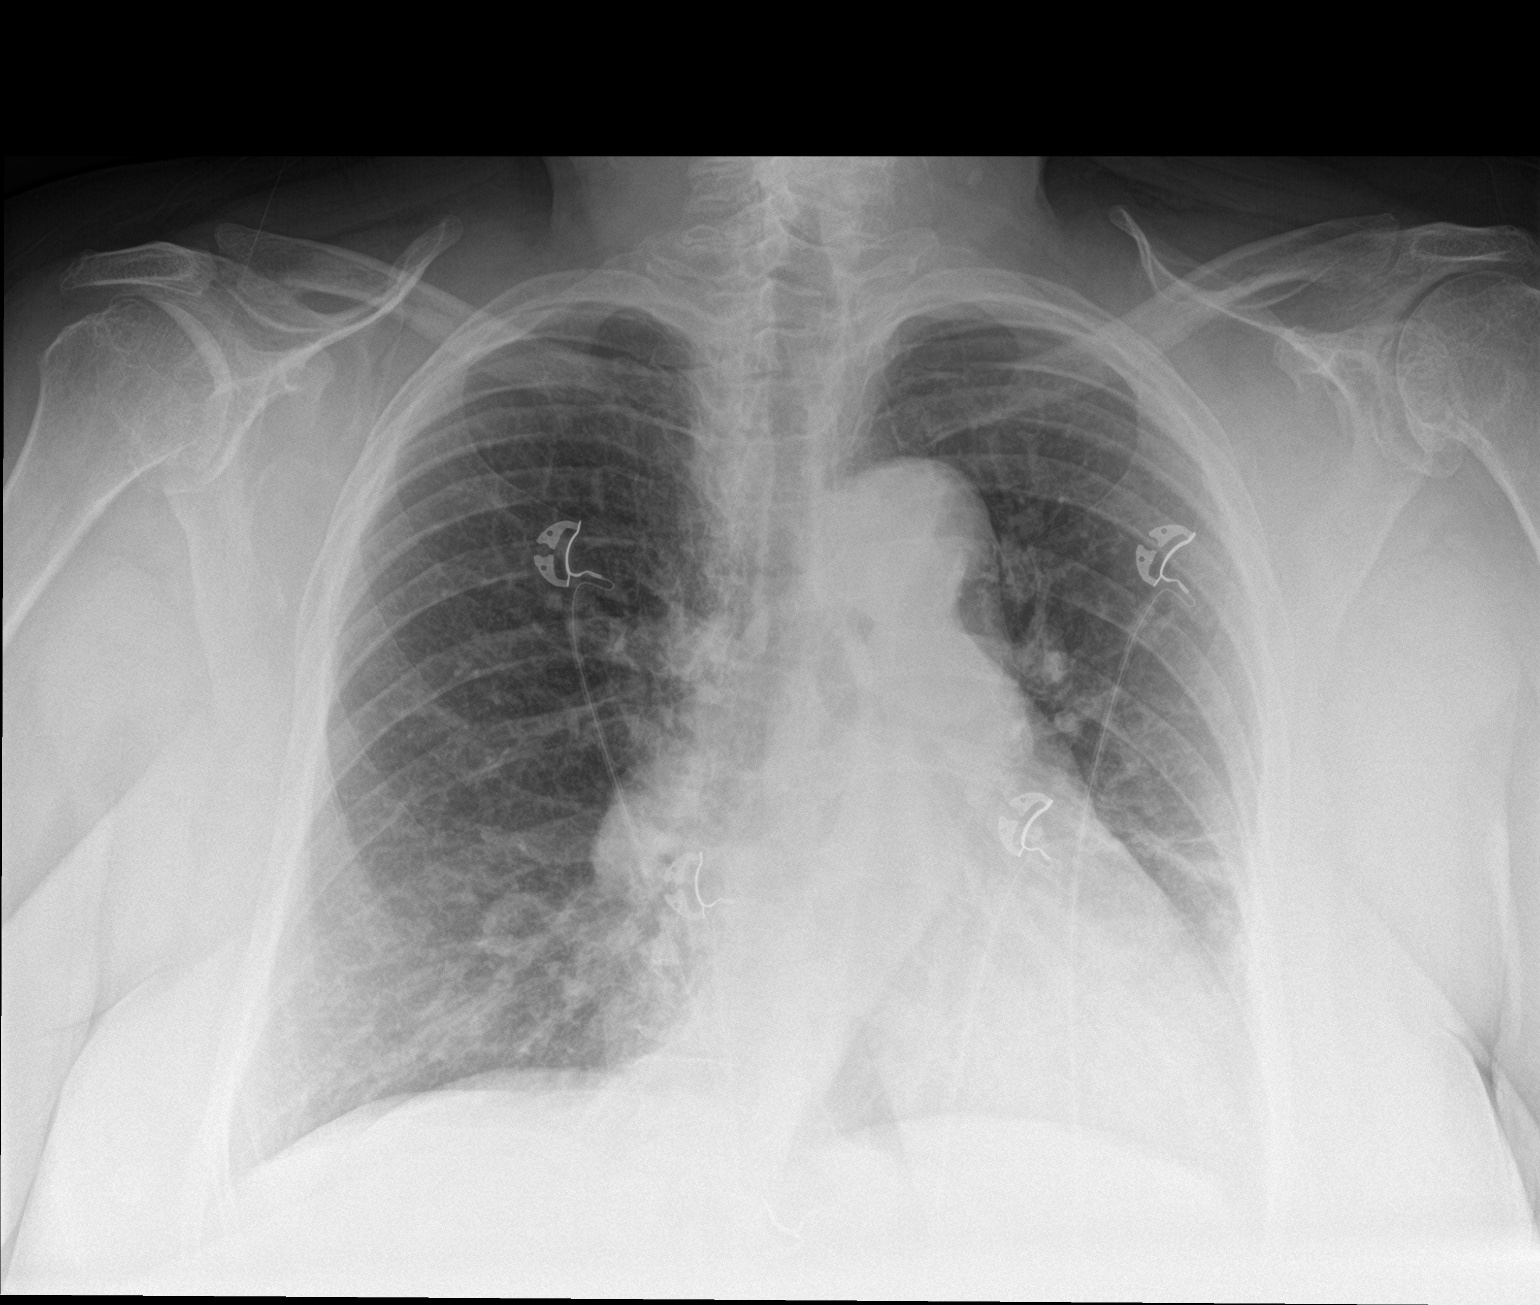

[chest lat]
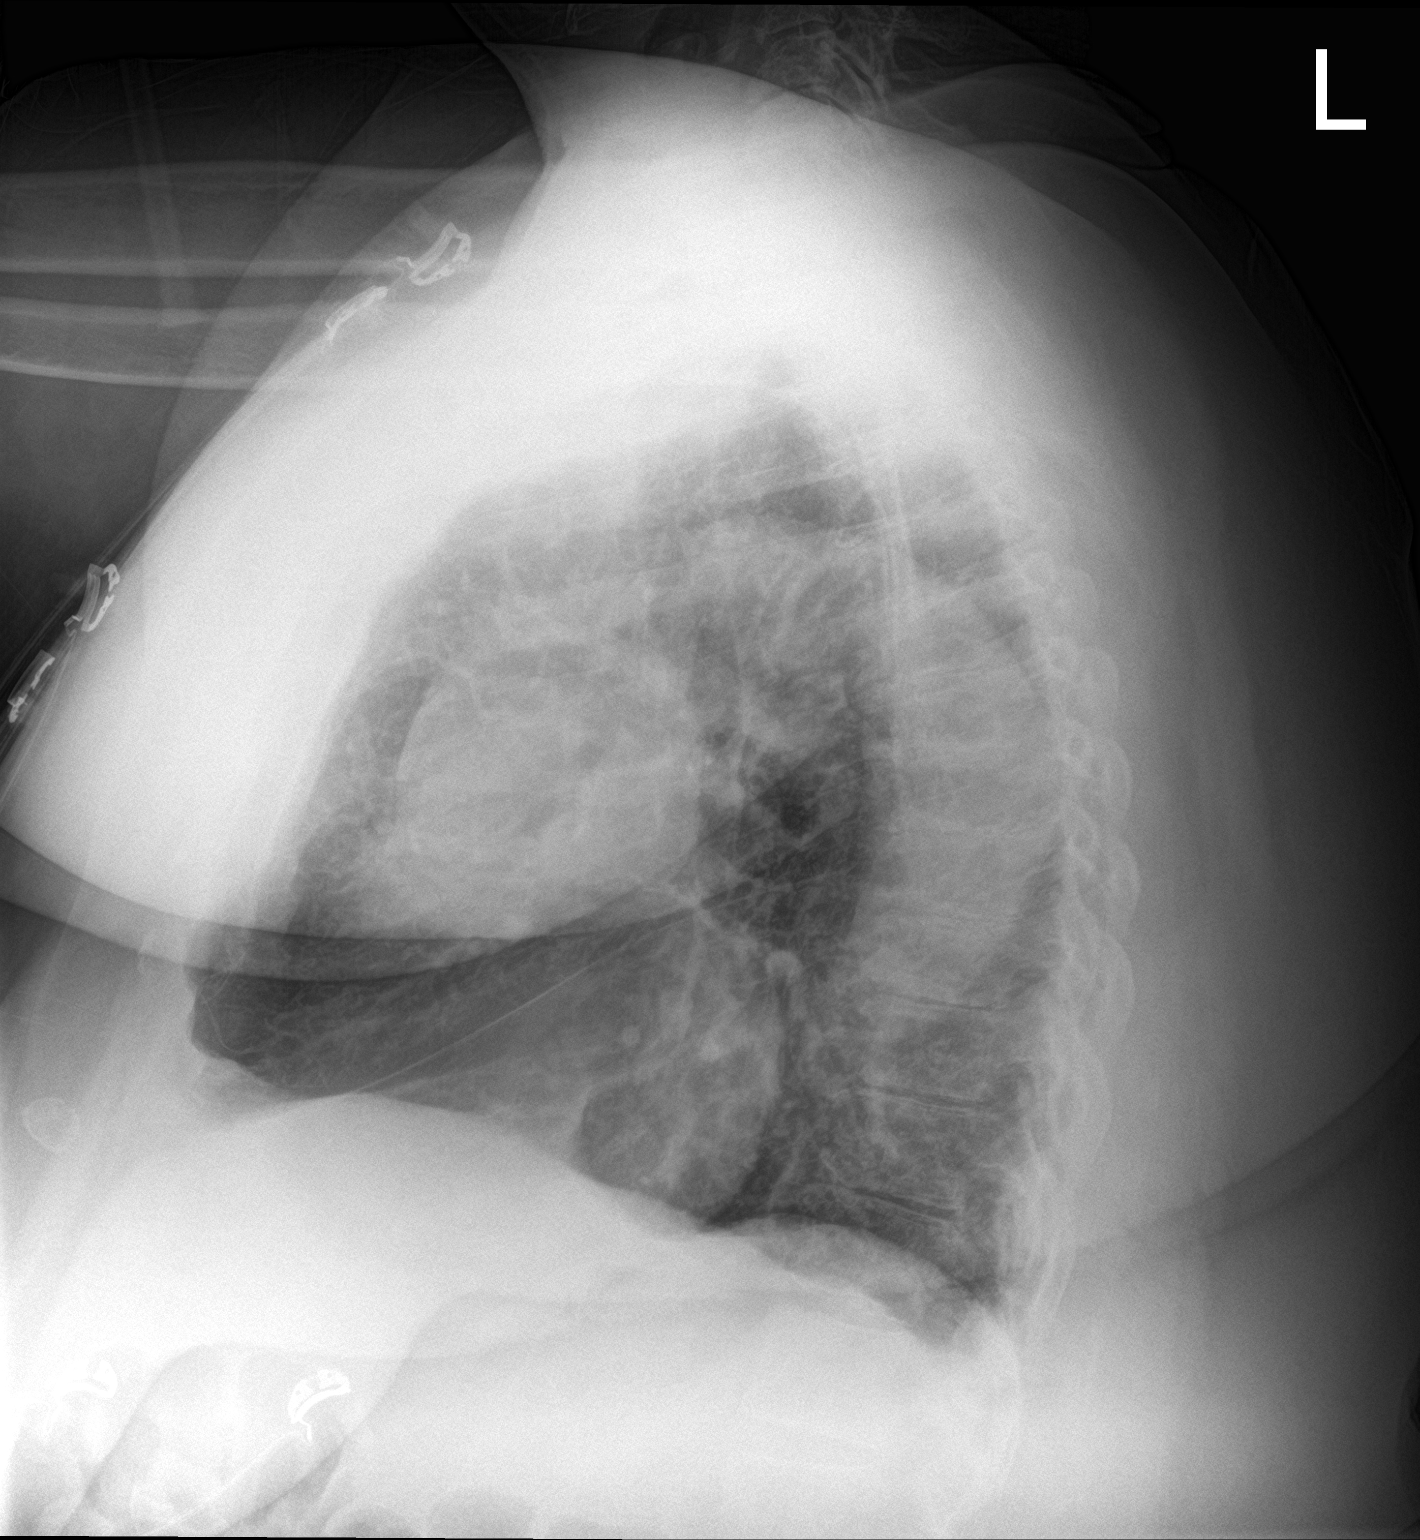

[2 of 2 positions shown; findings below may reference images not displayed]

FINDINGS: Cardiac and mediastinal contours are unchanged. Tortuous thoracic
aorta, unchanged compared to multiple priors. Mild interstitial
opacities of the lower lungs which are unchanged compared to prior
exam and likely due to underlying chronic lung changes.
IMPRESSION: No active cardiopulmonary disease.

## 2023-08-24 DIAGNOSIS — M130 Polyarthritis, unspecified: Secondary | ICD-10-CM | POA: Diagnosis not present

## 2023-08-24 DIAGNOSIS — I129 Hypertensive chronic kidney disease with stage 1 through stage 4 chronic kidney disease, or unspecified chronic kidney disease: Secondary | ICD-10-CM | POA: Diagnosis not present

## 2023-08-24 DIAGNOSIS — I10 Essential (primary) hypertension: Secondary | ICD-10-CM | POA: Diagnosis not present

## 2023-09-12 DIAGNOSIS — J449 Chronic obstructive pulmonary disease, unspecified: Secondary | ICD-10-CM | POA: Diagnosis not present

## 2023-09-12 DIAGNOSIS — G4733 Obstructive sleep apnea (adult) (pediatric): Secondary | ICD-10-CM | POA: Diagnosis not present

## 2023-10-13 DIAGNOSIS — G4733 Obstructive sleep apnea (adult) (pediatric): Secondary | ICD-10-CM | POA: Diagnosis not present

## 2023-10-13 DIAGNOSIS — J449 Chronic obstructive pulmonary disease, unspecified: Secondary | ICD-10-CM | POA: Diagnosis not present

## 2023-11-05 DIAGNOSIS — Z20828 Contact with and (suspected) exposure to other viral communicable diseases: Secondary | ICD-10-CM | POA: Diagnosis not present

## 2023-11-12 DIAGNOSIS — G4733 Obstructive sleep apnea (adult) (pediatric): Secondary | ICD-10-CM | POA: Diagnosis not present

## 2023-11-18 DIAGNOSIS — E559 Vitamin D deficiency, unspecified: Secondary | ICD-10-CM | POA: Diagnosis not present

## 2023-11-18 DIAGNOSIS — I129 Hypertensive chronic kidney disease with stage 1 through stage 4 chronic kidney disease, or unspecified chronic kidney disease: Secondary | ICD-10-CM | POA: Diagnosis not present

## 2023-11-18 DIAGNOSIS — R6510 Systemic inflammatory response syndrome (SIRS) of non-infectious origin without acute organ dysfunction: Secondary | ICD-10-CM | POA: Diagnosis not present

## 2023-11-18 DIAGNOSIS — I10 Essential (primary) hypertension: Secondary | ICD-10-CM | POA: Diagnosis not present

## 2023-11-18 DIAGNOSIS — M130 Polyarthritis, unspecified: Secondary | ICD-10-CM | POA: Diagnosis not present

## 2023-11-18 DIAGNOSIS — R7303 Prediabetes: Secondary | ICD-10-CM | POA: Diagnosis not present

## 2023-12-13 DIAGNOSIS — J449 Chronic obstructive pulmonary disease, unspecified: Secondary | ICD-10-CM | POA: Diagnosis not present

## 2023-12-13 DIAGNOSIS — G4733 Obstructive sleep apnea (adult) (pediatric): Secondary | ICD-10-CM | POA: Diagnosis not present

## 2024-02-26 ENCOUNTER — Encounter: Payer: Self-pay | Admitting: *Deleted

## 2024-02-26 NOTE — Progress Notes (Signed)
 Christine Parrish                                          MRN: [id number]   02/26/2024   The [facility] Quality Team Specialist reviewed this patient medical record for the purposes of chart review for care gap closure. The following were reviewed: chart review for care gap closure-controlling blood pressure and kidney health evaluation for diabetes:eGFR  and uACR.    [facility] Quality Team
